# Patient Record
Sex: Female | Born: 1955 | Race: White | Hispanic: No | Marital: Single | Smoking: Former smoker
Health system: Southern US, Community
[De-identification: ages and names within clinical notes are randomized; demographics above are authoritative.]

## PROBLEM LIST (undated history)

## (undated) DIAGNOSIS — I10 Essential (primary) hypertension: Secondary | ICD-10-CM

## (undated) DIAGNOSIS — Z87442 Personal history of urinary calculi: Secondary | ICD-10-CM

## (undated) DIAGNOSIS — N200 Calculus of kidney: Secondary | ICD-10-CM

## (undated) DIAGNOSIS — K219 Gastro-esophageal reflux disease without esophagitis: Secondary | ICD-10-CM

## (undated) DIAGNOSIS — Z720 Tobacco use: Secondary | ICD-10-CM

## (undated) DIAGNOSIS — I779 Disorder of arteries and arterioles, unspecified: Secondary | ICD-10-CM

## (undated) DIAGNOSIS — E785 Hyperlipidemia, unspecified: Secondary | ICD-10-CM

## (undated) DIAGNOSIS — I739 Peripheral vascular disease, unspecified: Secondary | ICD-10-CM

## (undated) DIAGNOSIS — I771 Stricture of artery: Secondary | ICD-10-CM

## (undated) DIAGNOSIS — I2510 Atherosclerotic heart disease of native coronary artery without angina pectoris: Secondary | ICD-10-CM

## (undated) DIAGNOSIS — Z8249 Family history of ischemic heart disease and other diseases of the circulatory system: Secondary | ICD-10-CM

## (undated) DIAGNOSIS — F32A Depression, unspecified: Secondary | ICD-10-CM

## (undated) DIAGNOSIS — F329 Major depressive disorder, single episode, unspecified: Secondary | ICD-10-CM

## (undated) DIAGNOSIS — E119 Type 2 diabetes mellitus without complications: Secondary | ICD-10-CM

## (undated) DIAGNOSIS — I219 Acute myocardial infarction, unspecified: Secondary | ICD-10-CM

## (undated) DIAGNOSIS — Z972 Presence of dental prosthetic device (complete) (partial): Secondary | ICD-10-CM

## (undated) DIAGNOSIS — K08109 Complete loss of teeth, unspecified cause, unspecified class: Secondary | ICD-10-CM

## (undated) DIAGNOSIS — E039 Hypothyroidism, unspecified: Secondary | ICD-10-CM

## (undated) DIAGNOSIS — C679 Malignant neoplasm of bladder, unspecified: Secondary | ICD-10-CM

## (undated) DIAGNOSIS — G629 Polyneuropathy, unspecified: Secondary | ICD-10-CM

## (undated) DIAGNOSIS — F419 Anxiety disorder, unspecified: Secondary | ICD-10-CM

## (undated) DIAGNOSIS — D649 Anemia, unspecified: Secondary | ICD-10-CM

## (undated) DIAGNOSIS — M797 Fibromyalgia: Secondary | ICD-10-CM

## (undated) HISTORY — PX: CORONARY ANGIOPLASTY WITH STENT PLACEMENT: SHX49

## (undated) HISTORY — DX: Disorder of arteries and arterioles, unspecified: I77.9

## (undated) HISTORY — DX: Family history of ischemic heart disease and other diseases of the circulatory system: Z82.49

## (undated) HISTORY — DX: Stricture of artery: I77.1

## (undated) HISTORY — DX: Peripheral vascular disease, unspecified: I73.9

## (undated) HISTORY — DX: Tobacco use: Z72.0

## (undated) HISTORY — DX: Hyperlipidemia, unspecified: E78.5

## (undated) HISTORY — DX: Essential (primary) hypertension: I10

## (undated) HISTORY — DX: Atherosclerotic heart disease of native coronary artery without angina pectoris: I25.10

---

## 2008-09-27 HISTORY — PX: BACK SURGERY: SHX140

## 2011-12-30 HISTORY — PX: US ECHOCARDIOGRAPHY: HXRAD669

## 2013-05-18 ENCOUNTER — Encounter (HOSPITAL_COMMUNITY): Payer: Self-pay | Admitting: Cardiovascular Disease

## 2013-05-18 ENCOUNTER — Telehealth (HOSPITAL_COMMUNITY): Payer: Self-pay | Admitting: Cardiovascular Disease

## 2013-05-24 ENCOUNTER — Telehealth (HOSPITAL_COMMUNITY): Payer: Self-pay | Admitting: Cardiovascular Disease

## 2013-05-25 ENCOUNTER — Encounter (HOSPITAL_COMMUNITY): Payer: Self-pay | Admitting: Cardiovascular Disease

## 2013-06-01 ENCOUNTER — Telehealth (HOSPITAL_COMMUNITY): Payer: Self-pay | Admitting: *Deleted

## 2013-06-04 ENCOUNTER — Other Ambulatory Visit (HOSPITAL_COMMUNITY): Payer: Self-pay | Admitting: Cardiovascular Disease

## 2013-06-04 DIAGNOSIS — I739 Peripheral vascular disease, unspecified: Secondary | ICD-10-CM

## 2013-06-15 ENCOUNTER — Ambulatory Visit (HOSPITAL_COMMUNITY)
Admission: RE | Admit: 2013-06-15 | Discharge: 2013-06-15 | Disposition: A | Discharge status: Home / Self Care | Payer: Medicare Other | Source: Ambulatory Visit | Attending: Cardiovascular Disease | Admitting: Cardiovascular Disease

## 2013-06-15 DIAGNOSIS — I739 Peripheral vascular disease, unspecified: Secondary | ICD-10-CM | POA: Insufficient documentation

## 2013-06-15 DIAGNOSIS — I6529 Occlusion and stenosis of unspecified carotid artery: Secondary | ICD-10-CM

## 2013-06-15 NOTE — Progress Notes (Signed)
Carotid Duplex Completed. °Brianna L Mazza,RVT °

## 2013-06-22 ENCOUNTER — Telehealth: Payer: Self-pay | Admitting: Cardiovascular Disease

## 2013-06-22 NOTE — Telephone Encounter (Signed)
Returned call and spoke w/ pt.  Informed results not available to review and nurse will call or mail letter after they are reviewed by MD.  Pt verbalized understanding and agreed w/ plan.

## 2013-06-22 NOTE — Telephone Encounter (Signed)
Patient would like her doppler results

## 2013-06-28 ENCOUNTER — Telehealth: Payer: Self-pay | Admitting: Cardiovascular Disease

## 2013-06-28 DIAGNOSIS — I6529 Occlusion and stenosis of unspecified carotid artery: Secondary | ICD-10-CM

## 2013-06-28 NOTE — Telephone Encounter (Signed)
Patient notified of results of carotid dopplers

## 2013-06-28 NOTE — Telephone Encounter (Signed)
Patient notified of results of carotid dopplers 

## 2013-06-28 NOTE — Telephone Encounter (Signed)
lmom 

## 2013-06-28 NOTE — Telephone Encounter (Signed)
Order placed for repeat carotid doppler in 6 months 

## 2013-06-28 NOTE — Telephone Encounter (Signed)
Forwarded to Abbe Amsterdam, RN

## 2013-06-28 NOTE — Telephone Encounter (Signed)
Would like doppler results from about 2 weeks ago please.

## 2013-06-28 NOTE — Telephone Encounter (Signed)
Returning your call. °

## 2013-06-28 NOTE — Telephone Encounter (Signed)
Forwarded to Kathryn

## 2013-06-28 NOTE — Telephone Encounter (Signed)
Message copied by Marella Bile on Thu Jun 28, 2013  5:16 PM ------      Message from: [name]      Created: Wed Jun 27, 2013  8:54 PM       Mild to mod abn. Repeat 6 mo ------

## 2013-06-28 NOTE — Telephone Encounter (Signed)
Returning kathryns call °

## 2013-12-18 ENCOUNTER — Ambulatory Visit (HOSPITAL_COMMUNITY)
Admission: RE | Admit: 2013-12-18 | Discharge: 2013-12-18 | Disposition: A | Discharge status: Home / Self Care | Payer: Medicare Other | Source: Ambulatory Visit | Attending: Internal Medicine | Admitting: Internal Medicine

## 2013-12-18 DIAGNOSIS — I6529 Occlusion and stenosis of unspecified carotid artery: Secondary | ICD-10-CM | POA: Insufficient documentation

## 2013-12-18 NOTE — Progress Notes (Signed)
Carotid Duplex Completed. °Brianna L Mazza,RVT °

## 2014-01-01 ENCOUNTER — Other Ambulatory Visit: Payer: Self-pay | Admitting: *Deleted

## 2014-01-01 DIAGNOSIS — I6529 Occlusion and stenosis of unspecified carotid artery: Secondary | ICD-10-CM

## 2014-01-08 ENCOUNTER — Encounter: Payer: Self-pay | Admitting: Cardiovascular Disease

## 2014-01-08 ENCOUNTER — Ambulatory Visit (INDEPENDENT_AMBULATORY_CARE_PROVIDER_SITE_OTHER): Payer: Medicare Other | Admitting: Cardiovascular Disease

## 2014-01-08 VITALS — BP 110/62 | HR 91 | Ht 67.0 in | Wt 169.0 lb

## 2014-01-08 DIAGNOSIS — Z79899 Other long term (current) drug therapy: Secondary | ICD-10-CM

## 2014-01-08 DIAGNOSIS — E785 Hyperlipidemia, unspecified: Secondary | ICD-10-CM | POA: Insufficient documentation

## 2014-01-08 DIAGNOSIS — F17200 Nicotine dependence, unspecified, uncomplicated: Secondary | ICD-10-CM

## 2014-01-08 DIAGNOSIS — I2510 Atherosclerotic heart disease of native coronary artery without angina pectoris: Secondary | ICD-10-CM

## 2014-01-08 DIAGNOSIS — R5381 Other malaise: Secondary | ICD-10-CM

## 2014-01-08 DIAGNOSIS — I6529 Occlusion and stenosis of unspecified carotid artery: Secondary | ICD-10-CM

## 2014-01-08 DIAGNOSIS — Z720 Tobacco use: Secondary | ICD-10-CM | POA: Insufficient documentation

## 2014-01-08 DIAGNOSIS — I739 Peripheral vascular disease, unspecified: Secondary | ICD-10-CM

## 2014-01-08 DIAGNOSIS — D689 Coagulation defect, unspecified: Secondary | ICD-10-CM

## 2014-01-08 DIAGNOSIS — I779 Disorder of arteries and arterioles, unspecified: Secondary | ICD-10-CM

## 2014-01-08 DIAGNOSIS — I771 Stricture of artery: Secondary | ICD-10-CM

## 2014-01-08 DIAGNOSIS — R5383 Other fatigue: Secondary | ICD-10-CM

## 2014-01-08 NOTE — Patient Instructions (Addendum)
Dr. Allyson SabalBerry has ordered a peripheral angiogram (upper extremity angiogram)  to be done at [facility].  This procedure is going to look at the bloodflow in your lower extremities.  If Dr. Allyson SabalBerry is able to open up the arteries, you will have to spend one night in the hospital.  If he is not able to open the arteries, you will be able to go home that same day.    After the procedure, you will not be allowed to drive for 3 days or push, pull, or lift anything greater than 10 lbs for one week.    You will be required to have the following tests prior to the procedure:  1. Blood work-the blood work can be done no more than 7 days prior to the procedure.  It can be done at any [facility] lab.  There is one downstairs on the first floor of this building and one in the [facility] ([address])       *REPS none

## 2014-01-08 NOTE — Progress Notes (Signed)
01/08/2014 Diana Kennedy   1956/06/03  [id number]  Primary Physician Diana Kennedy,Diana R, PA-C Primary Cardiologist: Runell GessJonathan J. Kennedy Kinlaw MD Diana Kennedy,FACC,FAHA, FSCAI   HPI:  The patient is a 58 year old mildly-overweight single Caucasian female, mother of 3 and grandmother to 5 grandchildren, who is currently unemployed. She used to [place] at a [employer]. She is referred by Diana FeltsMike Duran, PA-C, for evaluation of carotid disease.   Her cardiac risk-factor profile is positive for ongoing tobacco abuse of 1/2 pack to 1 pack per day, having smoked for 30 years. I did counsel her for greater than 3 minutes regarding smoking cessation. She has treated hypertension as well as a family history of heart disease with a father who had an MI at age 58. 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 Her last catheterization was in 2009. She apparently has had 5 stents performed [facility]. Dot Kennedy [facility]. She continues to complain of exertional chest pain. She has never had a stroke. Diana Kennedy follows her lipid profile.   Since I saw her a year ago she's been clinically stable. Present carotid Dopplers revealed stable disease with mild right and moderate left ICA stenosis patient remains neurologically asymptomatic. He did also suggest significant right subclavian artery stenosis with to and fro left vertebral filling. She does complain of left upper extremity claudication.    Current Outpatient Prescriptions  Medication Sig Dispense Refill  . ALPRAZolam (XANAX) 1 MG tablet Take 1 tablet by mouth every 6 (six) hours as needed.      [name]. FLUoxetine (PROZAC) 40 MG capsule Take 1 capsule by mouth daily.      [name]. HYDROmorphone HCl 32 MG T24A Take 1 tablet by mouth daily.      [name]. levothyroxine (SYNTHROID, LEVOTHROID) 100 MCG tablet Take 1 tablet by mouth every morning.      [name]. LINZESS 290 MCG CAPS capsule Take 1 capsule by mouth daily.      [name]. LYRICA 200 MG capsule Take 1 capsule by  mouth daily.      . methocarbamol (ROBAXIN) 500 MG tablet Take 1 tablet by mouth 3 (three) times daily.      . Oxycodone HCl 20 MG TABS Take 1 tablet by mouth 5 (five) times daily.      . pravastatin (PRAVACHOL) 20 MG tablet Take 1 tablet by mouth daily.      [name]. PROAIR HFA 108 (90 BASE) MCG/ACT inhaler as needed.      . promethazine (PHENERGAN) 25 MG tablet Take 1 tablet by mouth every 6 (six) hours as needed.       No current facility-administered medications for this visit.    Allergies  Allergen Reactions  . Morphine And Related Itching    History   Social History  . Marital Status: Married    Spouse Name: N/A    Number of Children: N/A  . Years of Education: N/A   Occupational History  . Not on file.   Social History Main Topics  . Smoking status: Current Every Day Smoker -- 0.10 packs/day for 20 years    Types: Cigarettes  . Smokeless tobacco: Never Used  . Alcohol Use: No  . Drug Use: No  . Sexual Activity: Not on file   Other Topics Concern  . Not on file   Social History Narrative  . No narrative on file     Review of Systems: General: negative for chills, fever, night sweats  or weight changes.  Cardiovascular: negative for chest pain, dyspnea on exertion, edema, orthopnea, palpitations, paroxysmal nocturnal dyspnea or shortness of breath Dermatological: negative for rash Respiratory: negative for cough or wheezing Urologic: negative for hematuria Abdominal: negative for nausea, vomiting, diarrhea, bright red blood per rectum, melena, or hematemesis Neurologic: negative for visual changes, syncope, or dizziness All other systems reviewed and are otherwise negative except as noted above.    Blood pressure 110/62, pulse 91, height 5\' 7"  (1.702 m), weight 169 lb (76.658 kg).  General appearance: alert and no distress Neck: no adenopathy, no carotid bruit, no JVD, supple, symmetrical, trachea midline, thyroid not enlarged, symmetric, no  tenderness/mass/nodules and right subclavian artery bruit Lungs: clear to auscultation bilaterally Heart: regular rate and rhythm, S1, S2 normal, no murmur, click, rub or gallop Extremities: extremities normal, atraumatic, no cyanosis or edema  EKG normal sinus rhythm 91 without ST or T wave changes  ASSESSMENT AND PLAN:   Carotid artery disease The patient was referred to me by Diana FeltsMike Duran PA-C for evaluation of carotid artery disease. Her recent Dopplers performed 12/18/13 revealed mild right internal carotid artery stenosis A. Mild to moderate left. She did have high-grade disease in her right subclavian artery with and throat vertebral flow. She complains of right upper extremity claudication on occasion. Based on this I will arrange for her to undergo angiography and potential intervention.      Runell GessJonathan J. Belen Zwahlen MD FACP,FACC,FAHA, [facility] 01/08/2014 11:45 AM

## 2014-01-08 NOTE — Assessment & Plan Note (Signed)
The patient was referred to me by [name] PA-C for evaluation of carotid artery disease. Her recent Dopplers performed 12/18/13 revealed mild right internal carotid artery stenosis A. Mild to moderate left. She did have high-grade disease in her right subclavian artery with and throat vertebral flow. She complains of right upper extremity claudication on occasion. Based on this I will arrange for her to undergo angiography and potential intervention.

## 2014-01-14 ENCOUNTER — Telehealth: Payer: Self-pay | Admitting: Cardiovascular Disease

## 2014-01-14 NOTE — Telephone Encounter (Signed)
lmsg for patient to call and schedule PV angiogram.

## 2014-01-15 ENCOUNTER — Encounter: Payer: Self-pay | Admitting: Cardiovascular Disease

## 2014-02-01 LAB — CBC
HEMATOCRIT: 43.7 % (ref 36.0–46.0)
Hemoglobin: 14.6 g/dL (ref 12.0–15.0)
MCH: 28.4 pg (ref 26.0–34.0)
MCHC: 33.4 g/dL (ref 30.0–36.0)
MCV: 85 fL (ref 78.0–100.0)
PLATELETS: 280 10*3/uL (ref 150–400)
RBC: 5.14 MIL/uL — ABNORMAL HIGH (ref 3.87–5.11)
RDW: 14.2 % (ref 11.5–15.5)
WBC: 6.7 10*3/uL (ref 4.0–10.5)

## 2014-02-01 LAB — BASIC METABOLIC PANEL
BUN: 17 mg/dL (ref 6–23)
CO2: 29 meq/L (ref 19–32)
Calcium: 10 mg/dL (ref 8.4–10.5)
Chloride: 100 mEq/L (ref 96–112)
Creat: 0.71 mg/dL (ref 0.50–1.10)
GLUCOSE: 84 mg/dL (ref 70–99)
Potassium: 4.7 mEq/L (ref 3.5–5.3)
SODIUM: 140 meq/L (ref 135–145)

## 2014-02-01 LAB — TSH: TSH: 0.347 u[IU]/mL — AB (ref 0.350–4.500)

## 2014-02-01 LAB — APTT: aPTT: 34 seconds (ref 24–37)

## 2014-02-01 LAB — PROTIME-INR
INR: 0.89 (ref <1.50)
Prothrombin Time: 12 seconds (ref 11.6–15.2)

## 2014-02-04 ENCOUNTER — Encounter: Payer: Self-pay | Admitting: *Deleted

## 2014-02-07 ENCOUNTER — Ambulatory Visit (HOSPITAL_COMMUNITY)
Admission: RE | Admit: 2014-02-07 | Discharge: 2014-02-07 | Disposition: A | Discharge status: Home / Self Care | Payer: Medicare Other | Source: Ambulatory Visit | Attending: Cardiovascular Disease | Admitting: Cardiovascular Disease

## 2014-02-07 ENCOUNTER — Encounter (HOSPITAL_COMMUNITY)
Admission: RE | Disposition: A | Discharge status: Home / Self Care | Payer: Self-pay | Source: Ambulatory Visit | Attending: Cardiovascular Disease

## 2014-02-07 DIAGNOSIS — Z9861 Coronary angioplasty status: Secondary | ICD-10-CM | POA: Insufficient documentation

## 2014-02-07 DIAGNOSIS — I10 Essential (primary) hypertension: Secondary | ICD-10-CM | POA: Insufficient documentation

## 2014-02-07 DIAGNOSIS — I252 Old myocardial infarction: Secondary | ICD-10-CM | POA: Insufficient documentation

## 2014-02-07 DIAGNOSIS — F17200 Nicotine dependence, unspecified, uncomplicated: Secondary | ICD-10-CM | POA: Insufficient documentation

## 2014-02-07 DIAGNOSIS — I6529 Occlusion and stenosis of unspecified carotid artery: Secondary | ICD-10-CM | POA: Insufficient documentation

## 2014-02-07 DIAGNOSIS — I771 Stricture of artery: Secondary | ICD-10-CM | POA: Insufficient documentation

## 2014-02-07 DIAGNOSIS — I708 Atherosclerosis of other arteries: Secondary | ICD-10-CM

## 2014-02-07 SURGERY — BILATERAL UPPER EXTREMITY ANGIOGRAM

## 2014-02-07 MED ORDER — HEPARIN (PORCINE) IN NACL 2-0.9 UNIT/ML-% IJ SOLN
INTRAMUSCULAR | Status: AC
  Filled 2014-02-07: qty 1000

## 2014-02-07 MED ORDER — LIDOCAINE HCL (PF) 1 % IJ SOLN
INTRAMUSCULAR | Status: AC
  Filled 2014-02-07: qty 30

## 2014-02-07 MED ORDER — ASPIRIN 81 MG PO CHEW
81.0000 mg | CHEWABLE_TABLET | ORAL | Status: AC
  Administered 2014-02-07: 81 mg via ORAL
  Filled 2014-02-07: qty 1

## 2014-02-07 MED ORDER — FENTANYL CITRATE 0.05 MG/ML IJ SOLN
INTRAMUSCULAR | Status: AC
  Filled 2014-02-07: qty 2

## 2014-02-07 MED ORDER — SODIUM CHLORIDE 0.9 % IV SOLN: INTRAVENOUS | Status: AC

## 2014-02-07 MED ORDER — ASPIRIN EC 325 MG PO TBEC
325.0000 mg | DELAYED_RELEASE_TABLET | Freq: Every day | ORAL | Status: DC
Start: 2014-02-07 — End: 2014-02-07

## 2014-02-07 MED ORDER — MIDAZOLAM HCL 2 MG/2ML IJ SOLN
INTRAMUSCULAR | Status: AC
  Filled 2014-02-07: qty 2

## 2014-02-07 MED ORDER — SODIUM CHLORIDE 0.9 % IJ SOLN: 3.0000 mL | INTRAMUSCULAR | Status: DC | PRN

## 2014-02-07 MED ORDER — HYDRALAZINE HCL 20 MG/ML IJ SOLN: 10.0000 mg | INTRAMUSCULAR | Status: DC

## 2014-02-07 MED ORDER — OXYCODONE-ACETAMINOPHEN 5-325 MG PO TABS
2.0000 | ORAL_TABLET | Freq: Once | ORAL | Status: AC
  Administered 2014-02-07: 2 via ORAL

## 2014-02-07 MED ORDER — DIAZEPAM 5 MG PO TABS
5.0000 mg | ORAL_TABLET | ORAL | Status: AC
  Administered 2014-02-07: 5 mg via ORAL
  Filled 2014-02-07: qty 1

## 2014-02-07 MED ORDER — OXYCODONE-ACETAMINOPHEN 5-325 MG PO TABS
ORAL_TABLET | ORAL | Status: AC
  Filled 2014-02-07: qty 2

## 2014-02-07 MED ORDER — SODIUM CHLORIDE 0.9 % IV SOLN
INTRAVENOUS | Status: DC
  Administered 2014-02-07: 09:00:00 via INTRAVENOUS

## 2014-02-07 NOTE — Progress Notes (Signed)
Ambulated without complaint. Groin site level 0.

## 2014-02-07 NOTE — CV Procedure (Signed)
Diana Kennedy is a 58 y.o. female    [id number] LOCATION:  FACILITY: [facility]  PHYSICIAN: Nanetta BattyJonathan Anastaisa Wooding, M.D. Jan 01, 1956   DATE OF PROCEDURE:  02/07/2014  DATE OF DISCHARGE:     PV Angiogram/Intervention    History obtained from chart review.The patient is a 58 year old mildly-overweight single Caucasian female, mother of 3 and grandmother to 5 grandchildren, who is currently unemployed. She used to work at a [employer]. She is referred by [name], PA-C, for evaluation of carotid disease.   Her cardiac risk-factor profile is positive for ongoing tobacco abuse of 1/2 pack to 1 pack per day, having smoked for 30 years. I did counsel her for greater than 3 minutes regarding smoking cessation. She has treated hypertension as well as a family history of heart disease with a father who had an MI at age 869. 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 Her last catheterization was in 2009. She apparently has had 5 stents performed by [facility]. She continues to complain of exertional chest pain. She has never had a stroke. [name] follows her lipid profile.  Since I saw her a year ago she's been clinically stable. Present carotid Dopplers revealed stable disease with mild right and moderate left ICA stenosis patient remains neurologically asymptomatic. He did also suggest significant right subclavian artery stenosis with to and fro left vertebral filling. She does complain of left upper extremity claudication.    PROCEDURE DESCRIPTION:   The patient was brought to the second floor [facility] lab in the postabsorptive state. She was premedicated with Valium 5 mg by mouth. Her right groinwas prepped and shaved in usual sterile fashion. Xylocaine 1% was used  for local anesthesia. A 5 French sheath was inserted into the right common femoral artery using standard Seldinger technique. A 5 French pigtail catheter was used for  aortic arch angiography and a 5 JamaicaFrench JB 1 catheter was used for selective right and left subclavian artery angiography.Visipaque diluted for the entirety of the case (64 cc administered the patient). Retrograde aortic pressure was monitored during the case.   HEMODYNAMICS:    AO SYSTOLIC/AO DIASTOLIC: 161/85   Angiographic Data:   1: Aortic arch arteriogram-type I arch was patent arch vessels  2: Innominate angiogram-80-90% eccentric proximal/ostial right subclavian artery stenosis just beyond the takeoff of the right common carotid artery with poststenotic dilatation a small right vertebral artery.  3: Left subclavian artery-the left subclavian artery was widely patent. The left vertebral was dominant. There is no retrograde filling of the right vertebral with delayed imaging.  IMPRESSION:high-grade ostial right subclavian artery stenosis with poststenotic dilatation and a diminutive right vertebral artery. I believe this would be high risk for percutaneous intervention given its proximity to the common carotid artery, the poststenotic dilatation in the vertebral artery proximally as well. I'm not convinced that this is causing her symptoms since she has bilateral symptoms but only unilateral disease. The sheath was removed and pressure was held on the groin to achieve hemostasis. The patient left the lab in stable condition. She'll be gently hydrated, discharged home after remaining recumbent or 4 hours. I will see her back in the office in several weeks for followup and to discuss her therapeutic options. Might Armando Ganguran PA-C, for referring provider, was notified of these results.    Runell GessJonathan J Mahagony Grieb. MD, [facility] 02/07/2014 10:37 AM

## 2014-02-07 NOTE — Interval H&P Note (Signed)
History and Physical Interval Note:  02/07/2014 10:06 AM  Diana Kennedy  has presented today for surgery, with the diagnosis of pad  The various methods of treatment have been discussed with the patient and family. After consideration of risks, benefits and other options for treatment, the patient has consented to  Procedure(s): LOWER EXTREMITY ANGIOGRAM (N/A) as a surgical intervention .  The patient's history has been reviewed, patient examined, no change in status, stable for surgery.  I have reviewed the patient's chart and labs.  Questions were answered to the patient's satisfaction.     Runell GessJonathan J Berry

## 2014-02-07 NOTE — Discharge Instructions (Signed)
Arteriogram °Care After °These instructions give you information on caring for yourself after your procedure. Your doctor may also give you more specific instructions. Call your doctor if you have any problems or questions after your procedure. °HOME CARE °· Stay in bed the rest of the day. °· Keep your leg straight for at least 6 hours. °· Do not lift anything heavier than 10 pounds (about a gallon of milk) for 2 days. °· Do not walk a lot, run, or drive for 2 days. °· Return to normal activities in 2 days or as told by your doctor. °Finding out the results of your test °Ask when your test results will be ready. Make sure you get your test results. °GET HELP RIGHT AWAY IF:  °· You have fever of 102° F (38.9° C) or higher. °· You have more pain in your leg. °· The leg that was cut is: °· Bleeding. °· Puffy (swollen) or red. °· Cold. °· Pale or changes color. °· Weak. °· Tingly or numb. °If you go to the Emergency Room, tell your nurse that you have had an arteriogram. Take this paper with you to show the nurse. °MAKE SURE YOU: °· Understand these instructions. °· Will watch your condition. °· Will get help right away if you are not doing well or get worse. °Document Released: 12/10/2008 Document Revised: 12/06/2011 Document Reviewed: 12/10/2008 °ExitCare® Patient Information ©2014 [employer]. ° °

## 2014-02-07 NOTE — H&P (View-Only) (Signed)
01/08/2014 Diana Kennedy   1956/06/03  [id number]  Primary Physician [name],MICHAEL R, PA-C Primary Cardiologist: Runell GessJonathan J. Wilkes Potvin MD Roseanne RenoFACP,FACC,FAHA, FSCAI   HPI:  The patient is a 58 year old mildly-overweight single Caucasian female, mother of 3 and grandmother to 5 grandchildren, who is currently unemployed. She used to [place] a [employer]. She is referred by [name], PA-C, for evaluation of carotid disease.   Her cardiac risk-factor profile is positive for ongoing tobacco abuse of 1/2 pack to 1 pack per day, having smoked for 30 years. I did counsel her for greater than 3 minutes regarding smoking cessation. She has treated hypertension as well as a family history of heart disease with a father who had an MI at age 58. 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 Her last catheterization was in 2009. She apparently has had 5 stents performed by Dr. Dot Beenohrbeck in [facility]. She continues to complain of exertional chest pain. She has never had a stroke. [name] follows her lipid profile.   Since I saw her a year ago she's been clinically stable. Present carotid Dopplers revealed stable disease with mild right and moderate left ICA stenosis patient remains neurologically asymptomatic. He did also suggest significant right subclavian artery stenosis with to and fro left vertebral filling. She does complain of left upper extremity claudication.    Current Outpatient Prescriptions  Medication Sig Dispense Refill  . ALPRAZolam (XANAX) 1 MG tablet Take 1 tablet by mouth every 6 (six) hours as needed.      [name]. FLUoxetine (PROZAC) 40 MG capsule Take 1 capsule by mouth daily.      [name]. HYDROmorphone HCl 32 MG T24A Take 1 tablet by mouth daily.      [name]. levothyroxine (SYNTHROID, LEVOTHROID) 100 MCG tablet Take 1 tablet by mouth every morning.      [name]. LINZESS 290 MCG CAPS capsule Take 1 capsule by mouth daily.      [name]. LYRICA 200 MG capsule Take 1 capsule by  mouth daily.      . methocarbamol (ROBAXIN) 500 MG tablet Take 1 tablet by mouth 3 (three) times daily.      . Oxycodone HCl 20 MG TABS Take 1 tablet by mouth 5 (five) times daily.      . pravastatin (PRAVACHOL) 20 MG tablet Take 1 tablet by mouth daily.      [name]. PROAIR HFA 108 (90 BASE) MCG/ACT inhaler as needed.      . promethazine (PHENERGAN) 25 MG tablet Take 1 tablet by mouth every 6 (six) hours as needed.       No current facility-administered medications for this visit.    Allergies  Allergen Reactions  . Morphine And Related Itching    History   Social History  . Marital Status: Married    Spouse Name: N/A    Number of Children: N/A  . Years of Education: N/A   Occupational History  . Not on file.   Social History Main Topics  . Smoking status: Current Every Day Smoker -- 0.10 packs/day for 20 years    Types: Cigarettes  . Smokeless tobacco: Never Used  . Alcohol Use: No  . Drug Use: No  . Sexual Activity: Not on file   Other Topics Concern  . Not on file   Social History Narrative  . No narrative on file     Review of Systems: General: negative for chills, fever, night sweats  or weight changes.  Cardiovascular: negative for chest pain, dyspnea on exertion, edema, orthopnea, palpitations, paroxysmal nocturnal dyspnea or shortness of breath Dermatological: negative for rash Respiratory: negative for cough or wheezing Urologic: negative for hematuria Abdominal: negative for nausea, vomiting, diarrhea, bright red blood per rectum, melena, or hematemesis Neurologic: negative for visual changes, syncope, or dizziness All other systems reviewed and are otherwise negative except as noted above.    Blood pressure 110/62, pulse 91, height 5\' 7"  (1.702 m), weight 169 lb (76.658 kg).  General appearance: alert and no distress Neck: no adenopathy, no carotid bruit, no JVD, supple, symmetrical, trachea midline, thyroid not enlarged, symmetric, no  tenderness/mass/nodules and right subclavian artery bruit Lungs: clear to auscultation bilaterally Heart: regular rate and rhythm, S1, S2 normal, no murmur, click, rub or gallop Extremities: extremities normal, atraumatic, no cyanosis or edema  EKG normal sinus rhythm 91 without ST or T wave changes  ASSESSMENT AND PLAN:   Carotid artery disease The patient was referred to me by [name] PA-C for evaluation of carotid artery disease. Her recent Dopplers performed 12/18/13 revealed mild right internal carotid artery stenosis A. Mild to moderate left. She did have high-grade disease in her right subclavian artery with and throat vertebral flow. She complains of right upper extremity claudication on occasion. Based on this I will arrange for her to undergo angiography and potential intervention.      Runell GessJonathan J. Navneet Schmuck MD FACP,FACC,FAHA, [facility] 01/08/2014 11:45 AM

## 2014-03-06 ENCOUNTER — Encounter: Payer: Self-pay | Admitting: *Deleted

## 2014-03-08 ENCOUNTER — Encounter: Payer: Self-pay | Admitting: Cardiovascular Disease

## 2014-03-11 ENCOUNTER — Encounter: Payer: Self-pay | Admitting: Cardiology

## 2014-03-11 ENCOUNTER — Ambulatory Visit (INDEPENDENT_AMBULATORY_CARE_PROVIDER_SITE_OTHER): Payer: Medicare Other | Admitting: Cardiology

## 2014-03-11 VITALS — BP 100/70 | HR 62 | Ht 67.0 in | Wt 172.0 lb

## 2014-03-11 DIAGNOSIS — I6529 Occlusion and stenosis of unspecified carotid artery: Secondary | ICD-10-CM

## 2014-03-11 DIAGNOSIS — F17200 Nicotine dependence, unspecified, uncomplicated: Secondary | ICD-10-CM

## 2014-03-11 DIAGNOSIS — Z720 Tobacco use: Secondary | ICD-10-CM

## 2014-03-11 DIAGNOSIS — I209 Angina pectoris, unspecified: Secondary | ICD-10-CM

## 2014-03-11 DIAGNOSIS — I771 Stricture of artery: Secondary | ICD-10-CM

## 2014-03-11 DIAGNOSIS — I739 Peripheral vascular disease, unspecified: Secondary | ICD-10-CM

## 2014-03-11 DIAGNOSIS — I2510 Atherosclerotic heart disease of native coronary artery without angina pectoris: Secondary | ICD-10-CM

## 2014-03-11 DIAGNOSIS — I708 Atherosclerosis of other arteries: Secondary | ICD-10-CM

## 2014-03-11 DIAGNOSIS — I2089 Other forms of angina pectoris: Secondary | ICD-10-CM

## 2014-03-11 DIAGNOSIS — E785 Hyperlipidemia, unspecified: Secondary | ICD-10-CM

## 2014-03-11 DIAGNOSIS — I208 Other forms of angina pectoris: Secondary | ICD-10-CM

## 2014-03-11 NOTE — Assessment & Plan Note (Addendum)
Pt stated she must stop after climbing stairs due to shortness of breath and chest pain, resolves with rest.  She does not use NTG due to headaches.  Last nuc was 2013.  Last cath 2009 we will plan lexiscan myoview.  Discomfort in both arms could be secondary to cardiac angina.  She has a  history of 5 stents.

## 2014-03-11 NOTE — Assessment & Plan Note (Addendum)
Continued pain with Rt. Arm with any activity some in Lt arm as well.  She would like to see Dr. Myra GianottiBrabham for possible surgery.  Discussed with Dr. Allyson SabalBerry will refer.

## 2014-03-11 NOTE — Progress Notes (Signed)
03/14/2014   PCP: [name],MICHAEL R, PA-C   Chief Complaint  Patient presents with  . Follow-up    pt. did not have PV angio    Primary Cardiologist:Dr. Erlene QuanJ. Berry   HPI:  58 year old mildly-overweight single Caucasian female, mother of 3 and grandmother to 5 grandchildren, who is currently unemployed. She used to work at a [employer]. She was referred by [name], PA-C, to Dr. Allyson SabalBerry for evaluation of carotid disease.   Her cardiac risk-factor profile is positive for ongoing tobacco abuse of 1/2 pack to 1 pack per day, having smoked for 30 years. She is currently using smokeless device implants to decrease nicotine in the device in the upcoming weeks.   She has treated hypertension as well as a family history of heart disease with a father who had an MI at age 58. 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 Her last catheterization was in 2009. She apparently has had 5 stents performed [facility]. She continues to complain of exertional chest pain. She has never had a stroke. [name] follows her lipid profile.   Present carotid Dopplers revealed stable disease with mild right and moderate left ICA stenosis patient remains neurologically asymptomatic. They did also suggest significant right subclavian artery stenosis with to and fro left vertebral filling. She does complain of left upper extremity claudication.  She underwent PV angiogram with Dr. Allyson SabalBerry 14 2015.  She is back today to review results and to make further plans for her treatment .  She has high-grade ostial right subclavian artery stenosis with poststenotic dilatation and a diminutive right vertebral artery. I believe this would be high risk for percutaneous intervention given its proximity to the common carotid artery, the poststenotic dilatation in the vertebral artery proximally as well. Dr. Erlene QuanJ. Berry was not convinced that this is causing her  symptoms since she has bilateral symptoms but only unilateral disease.  Today she continues to complain of arm pain and fatigue of that arm.  She also complains of chest pain that is with activity. We'll plan to make her  An appointment with Dr. Myra GianottiBrabham per Dr. Hazle CocaBerry's suggestion- for another opinion and possible surgery.  She does need stress test if she has not had one recently. Since her symptoms can be in both arms it may be an anginal equivalent or cardiac pain.    Allergies  Allergen Reactions  . Morphine And Related Itching    Current Outpatient Prescriptions  Medication Sig Dispense Refill  . ALPRAZolam (XANAX) 1 MG tablet Take 1 tablet by mouth every 6 (six) hours as needed for anxiety.       [name]. FLUoxetine (PROZAC) 40 MG capsule Take 1 capsule by mouth daily.      [name]. HYDROmorphone HCl 32 MG T24A Take 1 tablet by mouth daily.      [name]. levothyroxine (SYNTHROID, LEVOTHROID) 100 MCG tablet Take 1 tablet by mouth every morning.      [name]. LINZESS 290 MCG CAPS capsule Take 1 capsule by mouth daily.      [name]. LYRICA 200 MG capsule Take 1 capsule by mouth daily.      . methocarbamol (ROBAXIN) 500 MG tablet Take 1 tablet by mouth 3 (three) times daily.      . Oxycodone HCl 20 MG TABS Take 1 tablet by mouth 5 (five) times daily.      .[name]  pravastatin (PRAVACHOL) 20 MG tablet Take 1 tablet by mouth daily.      [name]. PROAIR HFA 108 (90 BASE) MCG/ACT inhaler Inhale 1 puff into the lungs every 4 (four) hours as needed for wheezing.       . promethazine (PHENERGAN) 25 MG tablet Take 1 tablet by mouth every 6 (six) hours as needed for nausea.       [name]. zolpidem (AMBIEN) 10 MG tablet        No current facility-administered medications for this visit.    Past Medical History  Diagnosis Date  . Subclavian artery stenosis, right   . Carotid artery disease   . Tobacco abuse   . Coronary artery disease   . Hyperlipidemia   . Family history of coronary artery disease   . Hypertension     Past Surgical History    Procedure Laterality Date  . Back surgery  2010  . [place] echocardiography  12/30/2011    mild LVH, mild AOV sclerosis,trace MR,TR,trace/mild physiologic PI    ZOX:WRUEAVW:UJROS:General:no colds or fevers, + weight increase Skin:no rashes or ulcers HEENT:no blurred vision, no congestion CV:see HPI PUL:see HPI GI:no diarrhea constipation or melena, no indigestion GU:no hematuria, no dysuria MS:no joint pain, no claudication Neuro:no syncope, no lightheadedness Endo:no diabetes, + thyroid disease-stable  Wt Readings from Last 3 Encounters:  03/11/14 172 lb (78.019 kg)  02/07/14 160 lb (72.576 kg)  02/07/14 160 lb (72.576 kg)    PHYSICAL EXAM BP 100/70  Pulse 62  Ht 5\' 7"  (1.702 m)  Wt 172 lb (78.019 kg)  BMI 26.93 kg/m2 General:Pleasant affect, NAD Skin:Warm and dry, brisk capillary refill HEENT:normocephalic, sclera clear, mucus membranes moist Neck:supple, no JVD, no bruits, no adenopathy  Heart:S1S2 RRR without murmur, gallup, rub or click Lungs:clear without rales, rhonchi, or wheezes WJX:BJYNAbd:soft, non tender, + BS, do not palpate liver spleen or masses Ext:no lower ext edema, 2+ pedal pulses, 2+ radial pulses Neuro:alert and oriented, MAE, follows commands, + facial symmetry  [place] [facility] [place] no recent stress test. [facility] 2014 normal study normal cardiac chamber sizes.  Last nuclear stress test at [facility] stress test there is a medium perfusion abnormality of mild intensity in the basal inferior septal basal inferior and basal inferior lateral myocardial wall. EF was 66%. There was no evidence of ischemia on this test. Will have the patient undergo a stress test here in our office prior to her appointment with Dr. Myra GianottiBrabham  ASSESSMENT AND PLAN Subclavian artery stenosis, right Continued pain with Rt. Arm with any activity some in Lt arm as well.  She would like to see Dr. Myra GianottiBrabham for possible surgery.  Discussed with Dr. Allyson SabalBerry will refer.        Angina effort Pt stated she must stop after climbing stairs due to shortness of breath and chest pain, resolves with rest.  She does not use NTG due to headaches.  Last nuc was 2013.  Last cath 2009 we will plan lexiscan myoview.  Discomfort in both arms could be secondary to cardiac angina.  She has a  history of 5 stents.  Coronary artery disease involving native coronary artery History 5 coronary stents and myocardial infarction.  As stated we'll proceed with stress test.  Hyperlipidemia Managed by PCP  Tobacco abuse She is changed to paper cigarettes electronically cigarettes and hoping to decrease nicotine dosages

## 2014-03-11 NOTE — Patient Instructions (Addendum)
If your stress test from [facility] is not within a reasonable amount of time we will repeat.  We are scheduling an appt. For you with Dr. Myra GianottiBrabham vascular surgeon.    We will call you concerning the stress test once results of your old test reviewed by Dr. Allyson SabalBerry.--we will schedule Lexiscan myoview.

## 2014-03-13 ENCOUNTER — Telehealth (HOSPITAL_COMMUNITY): Payer: Self-pay

## 2014-03-14 ENCOUNTER — Encounter: Payer: Self-pay | Admitting: Cardiology

## 2014-03-14 NOTE — Assessment & Plan Note (Signed)
Managed by PCP

## 2014-03-14 NOTE — Assessment & Plan Note (Signed)
History 5 coronary stents and myocardial infarction.  As stated we'll proceed with stress test.

## 2014-03-14 NOTE — Assessment & Plan Note (Signed)
She is changed to paper cigarettes electronically cigarettes and hoping to decrease nicotine dosages

## 2014-03-15 ENCOUNTER — Encounter (HOSPITAL_COMMUNITY): Payer: Medicare Other

## 2014-03-21 ENCOUNTER — Telehealth (HOSPITAL_COMMUNITY): Payer: Self-pay

## 2014-03-22 ENCOUNTER — Telehealth (HOSPITAL_COMMUNITY): Payer: Self-pay

## 2014-03-26 ENCOUNTER — Encounter (HOSPITAL_COMMUNITY): Payer: Medicare Other

## 2014-03-28 NOTE — Telephone Encounter (Signed)
Encounter complete. 

## 2014-04-02 ENCOUNTER — Ambulatory Visit (HOSPITAL_COMMUNITY)
Admission: RE | Admit: 2014-04-02 | Discharge: 2014-04-02 | Disposition: A | Discharge status: Home / Self Care | Payer: Medicare Other | Source: Ambulatory Visit | Attending: Cardiology | Admitting: Cardiology

## 2014-04-02 DIAGNOSIS — R55 Syncope and collapse: Secondary | ICD-10-CM | POA: Diagnosis not present

## 2014-04-02 DIAGNOSIS — R5383 Other fatigue: Secondary | ICD-10-CM

## 2014-04-02 DIAGNOSIS — R0609 Other forms of dyspnea: Secondary | ICD-10-CM | POA: Diagnosis not present

## 2014-04-02 DIAGNOSIS — I2510 Atherosclerotic heart disease of native coronary artery without angina pectoris: Secondary | ICD-10-CM

## 2014-04-02 DIAGNOSIS — R0989 Other specified symptoms and signs involving the circulatory and respiratory systems: Secondary | ICD-10-CM | POA: Insufficient documentation

## 2014-04-02 DIAGNOSIS — I739 Peripheral vascular disease, unspecified: Secondary | ICD-10-CM

## 2014-04-02 DIAGNOSIS — R079 Chest pain, unspecified: Secondary | ICD-10-CM | POA: Insufficient documentation

## 2014-04-02 DIAGNOSIS — E785 Hyperlipidemia, unspecified: Secondary | ICD-10-CM

## 2014-04-02 DIAGNOSIS — R5381 Other malaise: Secondary | ICD-10-CM | POA: Diagnosis not present

## 2014-04-02 DIAGNOSIS — I208 Other forms of angina pectoris: Secondary | ICD-10-CM

## 2014-04-02 DIAGNOSIS — R002 Palpitations: Secondary | ICD-10-CM | POA: Diagnosis not present

## 2014-04-02 DIAGNOSIS — I209 Angina pectoris, unspecified: Secondary | ICD-10-CM

## 2014-04-02 MED ORDER — AMINOPHYLLINE 25 MG/ML IV SOLN
125.0000 mg | Freq: Once | INTRAVENOUS | Status: AC
  Administered 2014-04-02: 125 mg via INTRAVENOUS

## 2014-04-02 MED ORDER — TECHNETIUM TC 99M SESTAMIBI GENERIC - CARDIOLITE
10.6000 | Freq: Once | INTRAVENOUS | Status: AC | PRN
  Administered 2014-04-02: 11 via INTRAVENOUS

## 2014-04-02 MED ORDER — REGADENOSON 0.4 MG/5ML IV SOLN
0.4000 mg | Freq: Once | INTRAVENOUS | Status: AC
  Administered 2014-04-02: 0.4 mg via INTRAVENOUS

## 2014-04-02 MED ORDER — TECHNETIUM TC 99M SESTAMIBI GENERIC - CARDIOLITE
30.1000 | Freq: Once | INTRAVENOUS | Status: AC | PRN
  Administered 2014-04-02: 30.1 via INTRAVENOUS

## 2014-04-02 NOTE — Procedures (Addendum)
[facility] [address] [place] [id number] [phone]  Cardiology Nuclear Med Study  Diana Kennedy is a 58 y.o. female     MRN : [id number]     DOB: [birth year]  Procedure Date: 04/02/2014  Nuclear Med Background Indication for Stress Test:  Evaluation for Ischemia and Surgical Clearance History:  COPD and CAD;MI-X3;STENT/PTCA;Last NUC MPI in 2013;ECHO-12/30/2011 Cardiac Risk Factors: Carotid Disease, Family History - CAD, Hypertension, Lipids, Overweight, PVD and Smoker  Symptoms:  Chest Pain, DOE, Fatigue, Near Syncope and Palpitations   Nuclear Pre-Procedure Caffeine/Decaff Intake:  1:00am NPO After: 11am   IV Site: R Forearm  IV 0.9% NS with Angio Cath:  22g  Chest Size (in):  n/a  IV Started by: Berdie OgrenAmanda Wease, RN  Height: 5\' 7"  (1.702 m)  Cup Size: Kennedy  BMI:  Body mass index is 26.93 kg/(m^2). Weight:  172 lb (78.019 kg)   Tech Comments:  n/a    Nuclear Med Study 1 or 2 day study: 1 day  Stress Test Type:  Lexiscan  Order Authorizing Provider:  Nanetta BattyJonathan Berry, Diana Kennedy   Resting Radionuclide: Technetium 2655m Sestamibi  Resting Radionuclide Dose: 10.6 mCi   Stress Radionuclide:  Technetium 3555m Sestamibi  Stress Radionuclide Dose: 30.1 mCi           Stress Protocol Rest HR:78 Stress HR: 104  Rest BP: 127/85 Stress BP: 154/79  Exercise Time (min): n/a METS: n/a   Predicted Max HR: 163 bpm % Max HR: 63.8 bpm Rate Pressure Product: [id number]  Dose of Adenosine (mg):  n/a Dose of Lexiscan: 0.4 mg  Dose of Atropine (mg): n/a Dose of Dobutamine: n/a mcg/kg/min (at max HR)  Stress Test Technologist: Esperanza Sheetserry-Marie Martin, CCT Nuclear Technologist: Gonzella LexPam Phillips, CNMT   Rest Procedure:  Myocardial perfusion imaging was performed at rest 45 minutes following the intravenous administration of Technetium 3255m Sestamibi. Stress Procedure:  The patient received IV Lexiscan 0.4 mg over 15-seconds.  Technetium 5755m Sestamibi injected IV at  30-seconds.  Patient experienced SOB and headache and 125 mg of Aminophylline IV was administered.  There were no significant changes with Lexiscan.  Quantitative spect images were obtained after a 45 minute delay.  Transient Ischemic Dilatation (Normal <1.22):  1.06  QGS EDV:  50 ml QGS ESV:  13 ml LV Ejection Fraction: 75%  Rest ECG: NSR - Normal EKG  Stress ECG: No significant change from baseline ECG  QPS Raw Data Images:  Normal; no motion artifact; normal heart/lung ratio. Stress Images:  There is decreased uptake in the anterior wall. Rest Images:  Normal homogeneous uptake in all areas of the myocardium. Subtraction (SDS):  These findings are consistent with ischemia.  Impression Exercise Capacity:  Lexiscan with no exercise. BP Response:  Normal blood pressure response. Clinical Symptoms:  There is dyspnea. ECG Impression:  No significant ECG changes with Lexiscan. Comparison with Prior Nuclear Study: No images to compare  Overall Impression:  High risk stress nuclear study with mostly reversible mid to distal anterior defect. Suggestive of ischemia, however, shifting breast artifact cannot be excluded. Clinical correlation is recommended.  LV Wall Motion:  NL LV Function; NL Wall Motion; EF 75%  Diana Kennedy. Diana Kennedy, Diana Kennedy, [name] Board Certified in Nuclear Cardiology Attending Cardiologist [facility]  Diana Kennedy,Diana Birchard Kennedy, Diana Kennedy  04/02/2014 5:24 PM

## 2014-04-03 ENCOUNTER — Telehealth: Payer: Self-pay | Admitting: Cardiology

## 2014-04-03 NOTE — Telephone Encounter (Signed)
I asked pt to call ( I left a message) concerning stress test and need for cardiac cath.

## 2014-04-04 NOTE — Telephone Encounter (Signed)
Encounter compete. 

## 2014-04-05 ENCOUNTER — Ambulatory Visit (INDEPENDENT_AMBULATORY_CARE_PROVIDER_SITE_OTHER): Payer: Medicare Other | Admitting: Cardiovascular Disease

## 2014-04-05 ENCOUNTER — Encounter: Payer: Self-pay | Admitting: Cardiovascular Disease

## 2014-04-05 VITALS — BP 123/80 | HR 89 | Ht 67.0 in | Wt 170.0 lb

## 2014-04-05 DIAGNOSIS — Z79899 Other long term (current) drug therapy: Secondary | ICD-10-CM

## 2014-04-05 DIAGNOSIS — D689 Coagulation defect, unspecified: Secondary | ICD-10-CM

## 2014-04-05 DIAGNOSIS — I6529 Occlusion and stenosis of unspecified carotid artery: Secondary | ICD-10-CM

## 2014-04-05 DIAGNOSIS — R9439 Abnormal result of other cardiovascular function study: Secondary | ICD-10-CM

## 2014-04-05 NOTE — Assessment & Plan Note (Signed)
Patient has a history of CAD status post myocardial infarction in 2004. She's had multiple heart attacks since and has had multiple stents implanted in [facility] last one being in 2009. 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 A recent Myoview stress test showed distal anteroapical ischemia based on this, I am going to suggest we proceed with operation diagnostic coronary arteriography. I thoroughly explained the risks and benefits.

## 2014-04-05 NOTE — Progress Notes (Signed)
04/05/2014 Diana Kennedy   30-Oct-1955  [id number]  Primary Physician Diana Kennedy,Diana R, PA-C Primary Cardiologist: Runell GessJonathan J. Berry MD Diana Kennedy,FACC,FAHA, FSCAI   HPI:  The patient is a 58 year old mildly-overweight single Caucasian female, mother of 3 and grandmother to 5 grandchildren, who is currently unemployed. She [place] to [place] a [employer]. She is referred by Diana FeltsMike Duran, PA-C, for evaluation of carotid disease.   Her cardiac risk-factor profile is positive for ongoing tobacco abuse of 1/2 pack to 1 pack per day, having smoked for 30 years. I did counsel her for greater than 3 minutes regarding smoking cessation. She has treated hypertension as well as a family history of heart disease with a father who had an MI at age 58. 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 Her last catheterization was in 2009. She apparently has had 5 stents [facility]. She continues to complain of exertional chest pain. She has never had a stroke. Diana Kennedy follows her lipid profile.  Since I saw her a year ago she's been clinically stable. Present carotid Dopplers revealed stable disease with mild right and moderate left ICA stenosis patient remains neurologically asymptomatic. It did also suggest significant right subclavian artery stenosis with to and fro left vertebral filling. She does complain of right upper extremity claudication.  On 02/08/14 I performed arch angiography as well as angiography of her subclavian arteries revealing a high-grade proximal right subclavian artery stenosis with post stenotic dilatation. I thought the best option was carotid to subclavian bypass given its proximity to the common carotid artery. She also complains of exertional chest pain. A recent Myoview stress test showed anteroapical ischemia. Based on this, I have decided to proceed with operation cardiac catheterization.    Current Outpatient  Prescriptions  Medication Sig Dispense Refill  . ALPRAZolam (XANAX) 1 MG tablet Take 1 tablet by mouth every 6 (six) hours as needed for anxiety.       Diana Kennedy. FLUoxetine (PROZAC) 40 MG capsule Take 1 capsule by mouth daily.      Diana Kennedy. HYDROmorphone HCl 32 MG T24A Take 1 tablet by mouth daily.      Diana Kennedy. levothyroxine (SYNTHROID, LEVOTHROID) 100 MCG tablet Take 1 tablet by mouth every morning.      Diana Kennedy. LINZESS 290 MCG CAPS capsule Take 1 capsule by mouth daily.      Diana Kennedy. LYRICA 200 MG capsule Take 1 capsule by mouth daily.      . methocarbamol (ROBAXIN) 500 MG tablet Take 1 tablet by mouth 3 (three) times daily.      . Oxycodone HCl 20 MG TABS Take 1 tablet by mouth 5 (five) times daily.      . pravastatin (PRAVACHOL) 20 MG tablet Take 1 tablet by mouth daily.      Diana Kennedy. PROAIR HFA 108 (90 BASE) MCG/ACT inhaler Inhale 1 puff into the lungs every 4 (four) hours as needed for wheezing.       . promethazine (PHENERGAN) 25 MG tablet Take 1 tablet by mouth every 6 (six) hours as needed for nausea.       Diana Kennedy. zolpidem (AMBIEN) 10 MG tablet        No current facility-administered medications for this visit.    Allergies  Allergen Reactions  . Morphine And Related Itching    History   Social History  . Marital Status: Single    Spouse Name: N/A  Number of Children: N/A  . Years of Education: N/A   Occupational History  . Not on file.   Social History Main Topics  . Smoking status: Current Every Day Smoker -- 0.10 packs/day for 20 years    Types: Cigarettes  . Smokeless tobacco: Never Used  . Alcohol Use: No  . Drug Use: No  . Sexual Activity: Not on file   Other Topics Concern  . Not on file   Social History Narrative  . No narrative on file     Review of Systems: General: negative for chills, fever, night sweats or weight changes.  Cardiovascular: negative for chest pain, dyspnea on exertion, edema, orthopnea, palpitations, paroxysmal nocturnal dyspnea or shortness of breath Dermatological:  negative for rash Respiratory: negative for cough or wheezing Urologic: negative for hematuria Abdominal: negative for nausea, vomiting, diarrhea, bright red blood per rectum, melena, or hematemesis Neurologic: negative for visual changes, syncope, or dizziness All other systems reviewed and are otherwise negative except as noted above.    Blood pressure 123/80, pulse 89, height 5\' 7"  (1.702 m), weight 170 lb (77.111 kg).  General appearance: alert and no distress Neck: no adenopathy, no JVD, supple, symmetrical, trachea midline, thyroid not enlarged, symmetric, no tenderness/mass/nodules and right carotid/subclavian bruit Lungs: clear to auscultation bilaterally Heart: regular rate and rhythm, S1, S2 normal, no murmur, click, rub or gallop Extremities: extremities normal, atraumatic, no cyanosis or edema  EKG not performed today  ASSESSMENT AND PLAN:   Coronary artery disease involving native coronary artery Patient has a history of CAD status post myocardial infarction in 2004. She's had multiple heart attacks since and has had multiple stents implanted in [place] [facility] last one being in 2009. 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 A recent Myoview stress test showed distal anteroapical ischemia based on this, I am going to suggest we proceed with operation diagnostic coronary arteriography. I thoroughly explained the risks and benefits.      [name] MD FACP,FACC,FAHA, [facility] 04/05/2014 10:58 AM

## 2014-04-05 NOTE — Patient Instructions (Signed)
Your physician has requested that you have a cardiac catheterization. Cardiac catheterization is used to diagnose and/or treat various heart conditions. Doctors may recommend this procedure for a number of different reasons. The most common reason is to evaluate chest pain. Chest pain can be a symptom of coronary artery disease (CAD), and cardiac catheterization can show whether plaque is narrowing or blocking your heart's arteries. This procedure is also used to evaluate the valves, as well as measure the blood flow and oxygen levels in different parts of your heart. For further information please visit [email or web address].   Following your catheterization, you will not be allowed to drive for 3 days.  No lifting, pushing, or pulling greater that 10 pounds is allowed for 1 week.  You will be required to have the following tests prior to the procedure:  1. Blood work-the blood work can be done no more than 7 days prior to the procedure.  [facility] be done at [facility] lab.  There is one downstairs on the first floor of this building and one in the [facility] ([address])

## 2014-04-06 LAB — BASIC METABOLIC PANEL
BUN: 13 mg/dL (ref 6–23)
CHLORIDE: 101 meq/L (ref 96–112)
CO2: 27 mEq/L (ref 19–32)
CREATININE: 0.82 mg/dL (ref 0.50–1.10)
Calcium: 10 mg/dL (ref 8.4–10.5)
GLUCOSE: 75 mg/dL (ref 70–99)
POTASSIUM: 5 meq/L (ref 3.5–5.3)
Sodium: 139 mEq/L (ref 135–145)

## 2014-04-06 LAB — CBC
HCT: 41.9 % (ref 36.0–46.0)
Hemoglobin: 14 g/dL (ref 12.0–15.0)
MCH: 28.4 pg (ref 26.0–34.0)
MCHC: 33.4 g/dL (ref 30.0–36.0)
MCV: 85 fL (ref 78.0–100.0)
PLATELETS: 304 10*3/uL (ref 150–400)
RBC: 4.93 MIL/uL (ref 3.87–5.11)
RDW: 14.9 % (ref 11.5–15.5)
WBC: 8.4 10*3/uL (ref 4.0–10.5)

## 2014-04-06 LAB — PROTIME-INR
INR: 1 (ref <1.50)
PROTHROMBIN TIME: 13.2 s (ref 11.6–15.2)

## 2014-04-06 LAB — APTT: aPTT: 32 seconds (ref 24–37)

## 2014-04-08 ENCOUNTER — Encounter: Payer: Self-pay | Admitting: *Deleted

## 2014-04-08 ENCOUNTER — Encounter (HOSPITAL_COMMUNITY)
Admission: RE | Disposition: A | Discharge status: Home / Self Care | Payer: Self-pay | Source: Ambulatory Visit | Attending: Cardiovascular Disease

## 2014-04-08 ENCOUNTER — Ambulatory Visit (HOSPITAL_COMMUNITY)
Admission: RE | Admit: 2014-04-08 | Discharge: 2014-04-08 | Disposition: A | Discharge status: Home / Self Care | Payer: Medicare Other | Source: Ambulatory Visit | Attending: Cardiovascular Disease | Admitting: Cardiovascular Disease

## 2014-04-08 DIAGNOSIS — I6529 Occlusion and stenosis of unspecified carotid artery: Secondary | ICD-10-CM | POA: Diagnosis not present

## 2014-04-08 DIAGNOSIS — Z8249 Family history of ischemic heart disease and other diseases of the circulatory system: Secondary | ICD-10-CM | POA: Insufficient documentation

## 2014-04-08 DIAGNOSIS — I208 Other forms of angina pectoris: Secondary | ICD-10-CM

## 2014-04-08 DIAGNOSIS — F17200 Nicotine dependence, unspecified, uncomplicated: Secondary | ICD-10-CM | POA: Diagnosis not present

## 2014-04-08 DIAGNOSIS — I658 Occlusion and stenosis of other precerebral arteries: Secondary | ICD-10-CM | POA: Insufficient documentation

## 2014-04-08 DIAGNOSIS — Z9861 Coronary angioplasty status: Secondary | ICD-10-CM | POA: Diagnosis not present

## 2014-04-08 DIAGNOSIS — I10 Essential (primary) hypertension: Secondary | ICD-10-CM | POA: Insufficient documentation

## 2014-04-08 DIAGNOSIS — I2510 Atherosclerotic heart disease of native coronary artery without angina pectoris: Secondary | ICD-10-CM | POA: Diagnosis present

## 2014-04-08 DIAGNOSIS — E663 Overweight: Secondary | ICD-10-CM | POA: Diagnosis not present

## 2014-04-08 DIAGNOSIS — I252 Old myocardial infarction: Secondary | ICD-10-CM | POA: Insufficient documentation

## 2014-04-08 DIAGNOSIS — E785 Hyperlipidemia, unspecified: Secondary | ICD-10-CM

## 2014-04-08 DIAGNOSIS — I771 Stricture of artery: Secondary | ICD-10-CM | POA: Diagnosis not present

## 2014-04-08 DIAGNOSIS — R9439 Abnormal result of other cardiovascular function study: Secondary | ICD-10-CM

## 2014-04-08 DIAGNOSIS — I25118 Atherosclerotic heart disease of native coronary artery with other forms of angina pectoris: Secondary | ICD-10-CM

## 2014-04-08 HISTORY — PX: LEFT HEART CATHETERIZATION WITH CORONARY ANGIOGRAM: SHX5451

## 2014-04-08 SURGERY — LEFT HEART CATHETERIZATION WITH CORONARY ANGIOGRAM
Anesthesia: LOCAL

## 2014-04-08 MED ORDER — FENTANYL CITRATE 0.05 MG/ML IJ SOLN
INTRAMUSCULAR | Status: AC
  Filled 2014-04-08: qty 2

## 2014-04-08 MED ORDER — ASPIRIN 81 MG PO CHEW: 81.0000 mg | CHEWABLE_TABLET | Freq: Every day | ORAL | Status: DC

## 2014-04-08 MED ORDER — MIDAZOLAM HCL 2 MG/2ML IJ SOLN
INTRAMUSCULAR | Status: AC
  Filled 2014-04-08: qty 2

## 2014-04-08 MED ORDER — HEPARIN (PORCINE) IN NACL 2-0.9 UNIT/ML-% IJ SOLN
INTRAMUSCULAR | Status: AC
  Filled 2014-04-08: qty 1000

## 2014-04-08 MED ORDER — ASPIRIN 81 MG PO CHEW
81.0000 mg | CHEWABLE_TABLET | ORAL | Status: AC
  Administered 2014-04-08: 81 mg via ORAL

## 2014-04-08 MED ORDER — ASPIRIN 81 MG PO CHEW
CHEWABLE_TABLET | ORAL | Status: AC
  Filled 2014-04-08: qty 1

## 2014-04-08 MED ORDER — ACETAMINOPHEN 325 MG PO TABS: 650.0000 mg | ORAL_TABLET | ORAL | Status: DC | PRN

## 2014-04-08 MED ORDER — SODIUM CHLORIDE 0.9 % IV SOLN: INTRAVENOUS | Status: DC

## 2014-04-08 MED ORDER — HYDRALAZINE HCL 20 MG/ML IJ SOLN
10.0000 mg | INTRAMUSCULAR | Status: DC | PRN
Start: 2014-04-08 — End: 2014-04-08

## 2014-04-08 MED ORDER — LIDOCAINE HCL (PF) 1 % IJ SOLN
INTRAMUSCULAR | Status: AC
  Filled 2014-04-08: qty 30

## 2014-04-08 MED ORDER — NITROGLYCERIN 0.2 MG/ML ON CALL CATH LAB
INTRAVENOUS | Status: AC
Start: 2014-04-08 — End: 2014-04-08
  Filled 2014-04-08: qty 1

## 2014-04-08 MED ORDER — ONDANSETRON HCL 4 MG/2ML IJ SOLN: 4.0000 mg | Freq: Four times a day (QID) | INTRAMUSCULAR | Status: DC | PRN

## 2014-04-08 MED ORDER — SODIUM CHLORIDE 0.9 % IJ SOLN: 3.0000 mL | INTRAMUSCULAR | Status: DC | PRN

## 2014-04-08 MED ORDER — SODIUM CHLORIDE 0.9 % IV SOLN
INTRAVENOUS | Status: DC
  Administered 2014-04-08: 11:00:00 via INTRAVENOUS

## 2014-04-08 NOTE — Progress Notes (Signed)
Report received from Renee, RN.

## 2014-04-08 NOTE — Discharge Instructions (Signed)

## 2014-04-08 NOTE — Progress Notes (Signed)
PT RESTING FLAT IN BED NO ACUTE DISTRESS. NO CHEST PAIN, NO SOB. ALERT ORIENTED. SHEATTH PRESENT AT R GROIN REMOVED 1400 WITH NO COMPLICATION. NO HEMATOMA. NO HEMORRHAGE. PULSES INTACT. DENIES NEED TO VOID AT THIS TIME. VS STABLE. ADVISED PT TO REMAIN FLAT IN BED UNTIL INSTRUCTED OTHERWISE.

## 2014-04-08 NOTE — H&P (View-Only) (Signed)
04/05/2014 Diana Kennedy   30-Oct-1955  [id number]  Primary Physician [name],MICHAEL R, PA-C Primary Cardiologist: Runell GessJonathan J. Berry MD Roseanne RenoFACP,FACC,FAHA, FSCAI   HPI:  The patient is a 58 year old mildly-overweight single Caucasian female, mother of 3 and grandmother to 5 grandchildren, who is currently unemployed. She used to [place] at a [employer]. She is referred by [name], PA-C, for evaluation of carotid disease.   Her cardiac risk-factor profile is positive for ongoing tobacco abuse of 1/2 pack to 1 pack per day, having smoked for 30 years. I did counsel her for greater than 3 minutes regarding smoking cessation. She has treated hypertension as well as a family history of heart disease with a father who had an MI at age 58. 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 Her last catheterization was in 2009. She apparently has had 5 stents performed [facility]. She continues to complain of exertional chest pain. She has never had a stroke. [name] follows her lipid profile.  Since I saw her a year ago she's been clinically stable. Present carotid Dopplers revealed stable disease with mild right and moderate left ICA stenosis patient remains neurologically asymptomatic. It did also suggest significant right subclavian artery stenosis with to and fro left vertebral filling. She does complain of right upper extremity claudication.  On 02/08/14 I performed arch angiography as well as angiography of her subclavian arteries revealing a high-grade proximal right subclavian artery stenosis with post stenotic dilatation. I thought the best option was carotid to subclavian bypass given its proximity to the common carotid artery. She also complains of exertional chest pain. A recent Myoview stress test showed anteroapical ischemia. Based on this, I have decided to proceed with operation cardiac catheterization.    Current Outpatient  Prescriptions  Medication Sig Dispense Refill  . ALPRAZolam (XANAX) 1 MG tablet Take 1 tablet by mouth every 6 (six) hours as needed for anxiety.       [name]. FLUoxetine (PROZAC) 40 MG capsule Take 1 capsule by mouth daily.      [name]. HYDROmorphone HCl 32 MG T24A Take 1 tablet by mouth daily.      [name]. levothyroxine (SYNTHROID, LEVOTHROID) 100 MCG tablet Take 1 tablet by mouth every morning.      [name]. LINZESS 290 MCG CAPS capsule Take 1 capsule by mouth daily.      [name]. LYRICA 200 MG capsule Take 1 capsule by mouth daily.      . methocarbamol (ROBAXIN) 500 MG tablet Take 1 tablet by mouth 3 (three) times daily.      . Oxycodone HCl 20 MG TABS Take 1 tablet by mouth 5 (five) times daily.      . pravastatin (PRAVACHOL) 20 MG tablet Take 1 tablet by mouth daily.      [name]. PROAIR HFA 108 (90 BASE) MCG/ACT inhaler Inhale 1 puff into the lungs every 4 (four) hours as needed for wheezing.       . promethazine (PHENERGAN) 25 MG tablet Take 1 tablet by mouth every 6 (six) hours as needed for nausea.       [name]. zolpidem (AMBIEN) 10 MG tablet        No current facility-administered medications for this visit.    Allergies  Allergen Reactions  . Morphine And Related Itching    History   Social History  . Marital Status: Single    Spouse Name: N/A  Number of Children: N/A  . Years of Education: N/A   Occupational History  . Not on file.   Social History Main Topics  . Smoking status: Current Every Day Smoker -- 0.10 packs/day for 20 years    Types: Cigarettes  . Smokeless tobacco: Never Used  . Alcohol Use: No  . Drug Use: No  . Sexual Activity: Not on file   Other Topics Concern  . Not on file   Social History Narrative  . No narrative on file     Review of Systems: General: negative for chills, fever, night sweats or weight changes.  Cardiovascular: negative for chest pain, dyspnea on exertion, edema, orthopnea, palpitations, paroxysmal nocturnal dyspnea or shortness of breath Dermatological:  negative for rash Respiratory: negative for cough or wheezing Urologic: negative for hematuria Abdominal: negative for nausea, vomiting, diarrhea, bright red blood per rectum, melena, or hematemesis Neurologic: negative for visual changes, syncope, or dizziness All other systems reviewed and are otherwise negative except as noted above.    Blood pressure 123/80, pulse 89, height 5\' 7"  (1.702 m), weight 170 lb (77.111 kg).  General appearance: alert and no distress Neck: no adenopathy, no JVD, supple, symmetrical, trachea midline, thyroid not enlarged, symmetric, no tenderness/mass/nodules and right carotid/subclavian bruit Lungs: clear to auscultation bilaterally Heart: regular rate and rhythm, S1, S2 normal, no murmur, click, rub or gallop Extremities: extremities normal, atraumatic, no cyanosis or edema  EKG not performed today  ASSESSMENT AND PLAN:   Coronary artery disease involving native coronary artery Patient has a history of CAD status post myocardial infarction in 2004. She's had multiple heart attacks since and has had multiple stents implanted in [place] [facility] last one being in 2009. 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 A recent Myoview stress test showed distal anteroapical ischemia based on this, I am going to suggest we proceed with operation diagnostic coronary arteriography. I thoroughly explained the risks and benefits.      [name] MD FACP,FACC,FAHA, [facility] 04/05/2014 10:58 AM

## 2014-04-08 NOTE — CV Procedure (Signed)
Diana Kennedy is a 58 y.o. female    [id number] LOCATION:  FACILITY: [facility]  PHYSICIAN: Nanetta BattyJonathan Sindia Kowalczyk, M.D. August 15, 1956   DATE OF PROCEDURE:  04/08/2014  DATE OF DISCHARGE:     CARDIAC CATHETERIZATION     History obtained from chart review.The patient is a 58 year old mildly-overweight single Caucasian female, mother of 3 and grandmother to 5 grandchildren, who is currently unemployed. She used to [place] a [employer]. She is referred by [name], PA-C, for evaluation of carotid disease.   Her cardiac risk-factor profile is positive for ongoing tobacco abuse of 1/2 pack to 1 pack per day, having smoked for 30 years. I did counsel her for greater than 3 minutes regarding smoking cessation. She has treated hypertension as well as a family history of heart disease with a father who had an MI at age 58. 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 Her last catheterization was in 2009. She apparently has had 5 stents performed [facility]. She continues to complain of exertional chest pain. She has never had a stroke. [name] follows her lipid profile.  Since I saw her a year ago she's been clinically stable. Present carotid Dopplers revealed stable disease with mild right and moderate left ICA stenosis patient remains neurologically asymptomatic. He did also suggest significant right subclavian artery stenosis with to and fro left vertebral filling. She does complain of left upper extremity claudication.    PROCEDURE DESCRIPTION:   The patient was brought to the second floor [facility] lab in the postabsorptive state. She was premedicated with Valium 5 mg by mouth, IV Versed and fentanyl. Her right groin was prepped and shaved in usual sterile fashion. Xylocaine 1% was used for local anesthesia. A 5 French sheath was inserted into the right common femoral artery using standard Seldinger technique. 5 French right and  left Judkins diagnostic catheters along with a 5 French pigtail catheter were used for selective coronary angiography and left ventriculography respectively. Omnipaque dye was used for the entirety of the case. Retrograde aortic, left ventricular end back pressures were recorded. A total of 75 cc of contrast was administered the patient.   HEMODYNAMICS:    AO SYSTOLIC/AO DIASTOLIC: 118/65   LV SYSTOLIC/LV DIASTOLIC: 119/13  ANGIOGRAPHIC RESULTS:   1. Left main; normal  2. LAD; the proximal LAD that was widely patent. The first diagonal branch was moderate in size and had a 40-50% ostial stenosis. The second diagonal branch was small, arose from the mid third the vessel and had a 95% ostial stenosis. 3. Left circumflex; nondominant with a patent stent in the mid AV groove circumflex. The proximal third of the circumflex had diffuse 40-50% stenosis.[name].  4. Right coronary artery; the RCA was a dominant vessel. There were multiple stents in the proximal mid and distal vessel. The proximal PDA had 50-70% stenosis in the mid PDA had 50-60% stenosis. This was a small vessel in the 1.7 mm range. The mid RCA had 40-50% fairly focal stenosis just after an acute marginal branch. 5. Left ventriculography; RAO left ventriculogram was performed using  25 mL of Visipaque dye at 12 mL/second. The overall LVEF estimated  60 %Without wall motion abnormalities  IMPRESSION:Ms. Diana Kennedy has a widely patent stent in all 3 vessels otherwise noncritical CAD and normal LV function. I'm not sure the etiology of her Myoview abnormality. It is unclear to me that there is a culprit  vessel. We will continue to feed her medically. The sheath was removed and pressure was held on the groin to achieve hemostasis. The patient left the Cath Lab in stable condition. She will be gently hydrated and discharged on 4 hours. She will see mid-level provider back in one to 2 weeks and me back in 6-8 weeks.  [name] MD,  [facility] 04/08/2014 1:50 PM

## 2014-04-08 NOTE — Progress Notes (Signed)
Site area:Right groin 5FR sheath was removed  Site Prior to Removal:  Level 0  Pressure Applied For 20 MINUTES    Minutes Beginning at 1400  Manual:   Yes.    Patient Status During Pull:  Stable, O2 added at 2liters via [place]  Post Pull Groin Site:  Level 0  Post Pull Instructions Given:  Yes.    Post Pull Pulses Present:  Yes.    Dressing Applied:  Yes.    Comments:  Hemostatis achieved,  Pt remain alert and VS stable.  Pt in Short stay room 17.  Pt denies any discomfort at this time.

## 2014-04-08 NOTE — Interval H&P Note (Signed)
Cath Lab Visit (complete for each Cath Lab visit)  Clinical Evaluation Leading to the Procedure:   ACS: No.  Non-ACS:    Anginal Classification: CCS II  Anti-ischemic medical therapy: No Therapy  Non-Invasive Test Results: Intermediate-risk stress test findings: cardiac mortality 1-3%/year  Prior CABG: No previous CABG      History and Physical Interval Note:  04/08/2014 1:02 PM  Diana Kennedy  has presented today for surgery, with the diagnosis of cp  The various methods of treatment have been discussed with the patient and family. After consideration of risks, benefits and other options for treatment, the patient has consented to  Procedure(s): LEFT HEART CATHETERIZATION WITH CORONARY ANGIOGRAM (N/A) as a surgical intervention .  The patient's history has been reviewed, patient examined, no change in status, stable for surgery.  I have reviewed the patient's chart and labs.  Questions were answered to the patient's satisfaction.     Runell GessBERRY,Siobhan Zaro J

## 2014-04-08 NOTE — Progress Notes (Signed)
Update: [facility].

## 2014-04-08 NOTE — Progress Notes (Signed)
PT REMAINS STABLE, O2 SAT RANGES FROM 88-93 WITH NO SOB NOTED. PLACED ON 2L [place] . HOB FLAT, NO CHEST PAIN. CATH SITE INTACT, NO BLEEDING, NO HEMATOMA.

## 2014-04-10 NOTE — Telephone Encounter (Signed)
Encounter complete. 

## 2014-04-25 ENCOUNTER — Ambulatory Visit: Payer: Medicare Other | Admitting: Cardiology

## 2014-04-26 ENCOUNTER — Encounter: Payer: Self-pay | Admitting: Surgery

## 2014-04-29 ENCOUNTER — Encounter (HOSPITAL_COMMUNITY): Payer: Self-pay | Admitting: Pharmacy Technician

## 2014-04-29 ENCOUNTER — Encounter: Payer: Self-pay | Admitting: Surgery

## 2014-04-29 ENCOUNTER — Ambulatory Visit (INDEPENDENT_AMBULATORY_CARE_PROVIDER_SITE_OTHER): Payer: Medicare Other | Admitting: Surgery

## 2014-04-29 VITALS — BP 118/72 | HR 86 | Temp 99.2°F | Resp 18 | Wt 167.0 lb

## 2014-04-29 DIAGNOSIS — G458 Other transient cerebral ischemic attacks and related syndromes: Secondary | ICD-10-CM | POA: Diagnosis not present

## 2014-04-29 NOTE — Progress Notes (Signed)
Patient name: Diana Kennedy MRN: [id number] DOB: 1955/10/04 Sex: female   Referred by: Dr. Allyson Sabal  Reason for referral:  Chief Complaint  Patient presents with  . Sub-clavian    Right arm numbness x 2-3 months, no injury.  Hx of 5 cardiac stents. REF- Dr. Allyson Sabal    HISTORY OF PRESENT ILLNESS: This is a 58 year old female who is referred by Dr. Allyson Sabal for evaluation of right subclavian stenosis.  The patient reports having pain in her right arm with minimal activity.  She states that she will get pain in her right arm and hand when she lays on her left side.  She also has trouble coming in brushing her hair because her arm gives out.  She did not report any dizziness with activity.  She recently underwent angiography by Dr. Gery Pray which revealed greater than 80% proximal right subclavian stenosis at the innominate artery bifurcation.  Patient has a significant cardiac history.  She has a greater than 30 pack year history for smoking.  She had her first heart attack in 2004 and has had 2-3 heart attacks since then.  Her last cardiac catheterization was in 2009.  She had 5 stents placed in [place].  She continues to complain of exertional chest pain.  She recently had angiography which revealed widely patent stents.  She has known carotid occlusive disease with a 50-69% stenosis on the left, for which he remains asymptomatic, although she does complain of some slurred speech.  The patient suffers from hypercholesterolemia which is managed with a statin.  She is on single agent antiplatelet therapy with aspirin.  Her blood pressure is well controlled.    The patient has chronic pain all over.  She has exertional chest pain she has back pain, pain in both legs.  Past Medical History  Diagnosis Date  . Subclavian artery stenosis, right   . Carotid artery disease   . Tobacco abuse   . Coronary artery disease   . Hyperlipidemia   . Family history of coronary artery disease   . Hypertension      Past Surgical History  Procedure Laterality Date  . Back surgery  2010  . US echocardiography  12/30/2011    mild LVH, mild AOV sclerosis,trace MR,TR,trace/mild physiologic PI  . Coronary angioplasty with stent placement  2004, 2008?    5 vessel     History   Social History  . Marital Status: Single    Spouse Name: N/A    Number of Children: N/A  . Years of Education: N/A   Occupational History  . Not on file.   Social History Main Topics  . Smoking status: Current Every Day Smoker -- 0.10 packs/day for 20 years    Types: Cigarettes  . Smokeless tobacco: Never Used     Comment:  E-cigarettes   . Alcohol Use: No  . Drug Use: No  . Sexual Activity: Not on file   Other Topics Concern  . Not on file   Social History Narrative  . No narrative on file    Family History  Problem Relation Age of Onset  . Cancer Father     lung  . Heart attack Father     Allergies as of 04/29/2014 - Review Complete 04/29/2014  Allergen Reaction Noted  . Morphine and related Itching 01/08/2014    Current Outpatient Prescriptions on File Prior to Visit  Medication Sig Dispense Refill  . albuterol (PROVENTIL HFA;VENTOLIN HFA) 108 (90 BASE) MCG/ACT inhaler  Inhale 1 puff into the lungs every 6 (six) hours as needed for wheezing or shortness of breath.      . ALPRAZolam (XANAX) 1 MG tablet Take 1 mg by mouth 4 (four) times daily as needed for anxiety.      [name]. FLUoxetine (PROZAC) 40 MG capsule Take 1 capsule by mouth daily.      [name]. HYDROmorphone HCl 32 MG T24A Take 32 mg by mouth daily.      [name]. levothyroxine (SYNTHROID, LEVOTHROID) 100 MCG tablet Take 1 tablet by mouth every morning.      . Linaclotide (LINZESS) 290 MCG CAPS capsule Take 290 mcg by mouth daily.      . methocarbamol (ROBAXIN) 500 MG tablet Take 1 tablet by mouth 3 (three) times daily.      . Oxycodone HCl 20 MG TABS Take 1 tablet by mouth 5 (five) times daily.      . pravastatin (PRAVACHOL) 20 MG tablet Take 1 tablet by  mouth daily.      . pregabalin (LYRICA) 200 MG capsule Take 200 mg by mouth daily.      . promethazine (PHENERGAN) 25 MG tablet Take 25 mg by mouth 3 (three) times daily as needed for nausea or vomiting.      [name]. zolpidem (AMBIEN) 10 MG tablet Take 10 mg by mouth at bedtime.       No current facility-administered medications on file prior to visit.     REVIEW OF SYSTEMS: Please see history of present illness, otherwise systems negative.  PHYSICAL EXAMINATION: General: The patient appears their stated age.  Vital signs are BP 118/72  Pulse 86  Temp(Src) 99.2 F (37.3 C) (Oral)  Resp 18  Wt 167 lb (75.751 kg)  SpO2 98% HEENT:  No gross abnormalities Pulmonary: Respirations are non-labored Abdomen: Soft and non-tender  Musculoskeletal: There are no major deformities.   Neurologic: No focal weakness or paresthesias are detected, Skin: There are no ulcer or rashes noted. Psychiatric: The patient has normal affect. Cardiovascular: There is a regular rate and rhythm without significant murmur appreciated.  Right supraclavicular bruit  Diagnostic Studies: I have reviewed her arteriogram which shows greater than 80% right subclavian stenosis at the innominate bifurcation with poststenotic dilatation.  Assessment:  Right subclavian stenosis. Plan: I discussed my impression of her symptoms with regard and correlation to her angiographic findings.  The patient is convinced that she has significant pain in her right arm which is worse with activity but also bothers her at rest.  I discussed the possibility of continued observation vs. surgical bypass, as she is not a candidate for stenting given the location of the lesion.  The patient really wishes to proceed with surgery.  I discussed potential complications including the risk of phrenic nerve injury, lymphatic leak, cardiopulmonary complications.  Her operation has been scheduled for Thursday, August 13.  I was very clear giving her realistic  expectations.  She will likely have residual symptoms after the bypass as I do believe she has symptoms in her right arm that are unrelated to blood flow.  Regardless she stated that if she did have even a small amount of benefit in her right arm she would want to proceed.     Jorge NyV. Wells Brabham IV, M.D. [facility] Office: [phone] Pager:  [phone]

## 2014-04-30 ENCOUNTER — Other Ambulatory Visit: Payer: Self-pay | Admitting: *Deleted

## 2014-05-04 NOTE — Pre-Procedure Instructions (Signed)
Diana Kennedy  05/04/2014   Your procedure is scheduled on:  August 13  [facility] Admitting at 05:30 AM.  Call this number if you have problems the morning of surgery: [phone]   Remember:   Do not eat food or drink liquids after midnight.   Take these medicines the morning of surgery with A SIP OF WATER: Albuterol (if needed), Xanax (if needed), Prozac, Hydromorphone, Levothyroxine, Robaxin, Oxycodone, Linaclotide, Lyrica, Promethazine (if needed)   STOP/ Do not take Aleve, Naproxen, Advil, Ibuprofen, Motrin, Vitamins, Herbs, or Supplements starting today   Do not wear jewelry, make-up or nail polish.  Do not wear lotions, powders, or perfumes. You may wear deodorant.  Do not shave 48 hours prior to surgery. Men may shave face and neck.  Do not bring valuables to the hospital.  [facility] is not responsible for any belongings or valuables.               Contacts, dentures or bridgework may not be worn into surgery.  Leave suitcase in the car. After surgery it may be brought to your room.  For patients admitted to the hospital, discharge time is determined by your treatment team.               Special Instructions: See [facility] Preparing For Surgery   Please read over the following fact sheets that you were given: Pain Booklet, Coughing and Deep Breathing, Blood Transfusion Information and Surgical Site Infection Prevention

## 2014-05-06 ENCOUNTER — Ambulatory Visit (HOSPITAL_COMMUNITY)
Admission: RE | Admit: 2014-05-06 | Discharge: 2014-05-06 | Disposition: A | Discharge status: Home / Self Care | Payer: Medicare Other | Source: Ambulatory Visit | Attending: Anesthesiology | Admitting: Anesthesiology

## 2014-05-06 ENCOUNTER — Encounter (HOSPITAL_COMMUNITY)
Admission: RE | Admit: 2014-05-06 | Discharge: 2014-05-06 | Disposition: A | Discharge status: Home / Self Care | Payer: Medicare Other | Source: Ambulatory Visit | Attending: Surgery | Admitting: Surgery

## 2014-05-06 ENCOUNTER — Other Ambulatory Visit: Payer: Self-pay | Admitting: *Deleted

## 2014-05-06 ENCOUNTER — Encounter (HOSPITAL_COMMUNITY): Payer: Self-pay

## 2014-05-06 DIAGNOSIS — E785 Hyperlipidemia, unspecified: Secondary | ICD-10-CM | POA: Diagnosis present

## 2014-05-06 DIAGNOSIS — Z79899 Other long term (current) drug therapy: Secondary | ICD-10-CM

## 2014-05-06 DIAGNOSIS — I2510 Atherosclerotic heart disease of native coronary artery without angina pectoris: Secondary | ICD-10-CM | POA: Diagnosis present

## 2014-05-06 DIAGNOSIS — G458 Other transient cerebral ischemic attacks and related syndromes: Principal | ICD-10-CM | POA: Diagnosis present

## 2014-05-06 DIAGNOSIS — IMO0001 Reserved for inherently not codable concepts without codable children: Secondary | ICD-10-CM | POA: Diagnosis present

## 2014-05-06 DIAGNOSIS — Z01818 Encounter for other preprocedural examination: Secondary | ICD-10-CM | POA: Insufficient documentation

## 2014-05-06 DIAGNOSIS — Z9861 Coronary angioplasty status: Secondary | ICD-10-CM

## 2014-05-06 DIAGNOSIS — Z8249 Family history of ischemic heart disease and other diseases of the circulatory system: Secondary | ICD-10-CM

## 2014-05-06 DIAGNOSIS — E039 Hypothyroidism, unspecified: Secondary | ICD-10-CM | POA: Diagnosis present

## 2014-05-06 DIAGNOSIS — F17200 Nicotine dependence, unspecified, uncomplicated: Secondary | ICD-10-CM | POA: Diagnosis present

## 2014-05-06 DIAGNOSIS — I10 Essential (primary) hypertension: Secondary | ICD-10-CM | POA: Diagnosis present

## 2014-05-06 HISTORY — DX: Depression, unspecified: F32.A

## 2014-05-06 HISTORY — DX: Fibromyalgia: M79.7

## 2014-05-06 HISTORY — DX: Hypothyroidism, unspecified: E03.9

## 2014-05-06 HISTORY — DX: Major depressive disorder, single episode, unspecified: F32.9

## 2014-05-06 LAB — COMPREHENSIVE METABOLIC PANEL
ALK PHOS: 120 U/L — AB (ref 39–117)
ALT: 10 U/L (ref 0–35)
AST: 15 U/L (ref 0–37)
Albumin: 4.1 g/dL (ref 3.5–5.2)
Anion gap: 15 (ref 5–15)
BILIRUBIN TOTAL: 0.2 mg/dL — AB (ref 0.3–1.2)
BUN: 14 mg/dL (ref 6–23)
CALCIUM: 9.6 mg/dL (ref 8.4–10.5)
CHLORIDE: 99 meq/L (ref 96–112)
CO2: 26 meq/L (ref 19–32)
Creatinine, Ser: 0.82 mg/dL (ref 0.50–1.10)
GFR calc Af Amer: 90 mL/min — ABNORMAL LOW (ref >90)
GFR calc non Af Amer: 77 mL/min — ABNORMAL LOW (ref >90)
Glucose, Bld: 93 mg/dL (ref 70–99)
POTASSIUM: 4.3 meq/L (ref 3.7–5.3)
SODIUM: 140 meq/L (ref 137–147)
Total Protein: 8.6 g/dL — ABNORMAL HIGH (ref 6.0–8.3)

## 2014-05-06 LAB — URINALYSIS, ROUTINE W REFLEX MICROSCOPIC
Glucose, UA: NEGATIVE mg/dL
Hgb urine dipstick: NEGATIVE
KETONES UR: NEGATIVE mg/dL
Nitrite: NEGATIVE
Protein, ur: NEGATIVE mg/dL
Specific Gravity, Urine: 1.022 (ref 1.005–1.030)
Urobilinogen, UA: 1 mg/dL (ref 0.0–1.0)
pH: 5 (ref 5.0–8.0)

## 2014-05-06 LAB — CBC
HCT: 44.6 % (ref 36.0–46.0)
HEMOGLOBIN: 14.6 g/dL (ref 12.0–15.0)
MCH: 29.2 pg (ref 26.0–34.0)
MCHC: 32.7 g/dL (ref 30.0–36.0)
MCV: 89.2 fL (ref 78.0–100.0)
PLATELETS: 303 10*3/uL (ref 150–400)
RBC: 5 MIL/uL (ref 3.87–5.11)
RDW: 14.4 % (ref 11.5–15.5)
WBC: 8.3 10*3/uL (ref 4.0–10.5)

## 2014-05-06 LAB — TYPE AND SCREEN
ABO/RH(D): A NEG
Antibody Screen: NEGATIVE

## 2014-05-06 LAB — URINE MICROSCOPIC-ADD ON

## 2014-05-06 LAB — SURGICAL PCR SCREEN
MRSA, PCR: NEGATIVE
Staphylococcus aureus: NEGATIVE

## 2014-05-06 LAB — PROTIME-INR
INR: 1.01 (ref 0.00–1.49)
Prothrombin Time: 13.3 seconds (ref 11.6–15.2)

## 2014-05-06 LAB — ABO/RH: ABO/RH(D): A NEG

## 2014-05-06 LAB — APTT: aPTT: 30 seconds (ref 24–37)

## 2014-05-06 NOTE — Progress Notes (Addendum)
Anesthesia Note:  Patient is a 58 year old female scheduled for right carotid-subclavian artery bypass on 05/09/14 by Dr. Myra GianottiBrabham.  History includes smoking, right SCA stenosis ( > 80% proximal right SCA stenosis at the innominate artery bifurcation 02/08/14 angiography), carotid artery disease, CAD/MI s/p "[facility]" [facility] '04 and '[facility] (Dr. Dot Beenohrbeck), hypothyroidism, HLD, HTN, back surgery. PCP is listed as Roxanne MinsMichael Duran, PA-C [facility]  Cardiologist is Dr. Nanetta BattyJonathan Berry who referred patient to Dr. Myra GianottiBrabham for further evaluation/treatment of right SCA stenosis. Dr. Allyson SabalBerry recommended a pre-operative cardiac cath due to recent abnormal stress test.  Cath on 04/08/14 showed patent stents and continued medical therapy was recommended.  EKG on 04/08/14 showed: NSR, possible LAE, cannot rule out anterior infarct (age undetermined), RSR prime or QR pattern in V1 suggests RV conduction delay.    Nuclear stress test on 04/02/14 showed: Overall Impression: High risk stress nuclear study with mostly reversible mid to distal anterior defect. Suggestive of ischemia, however, shifting breast artifact cannot be excluded. Clinical correlation is recommended. LV Wall Motion: NL LV Function; NL Wall Motion; EF 75%. This lead to a cardiac cath on 04/08/14 that showed:  ANGIOGRAPHIC RESULTS:  1. Left main; normal  2. LAD; the proximal LAD that was widely patent. The first diagonal branch was moderate in size and had a 40-50% ostial stenosis. The second diagonal branch was small, arose from the mid third the vessel and had a 95% ostial stenosis.  3. Left circumflex; nondominant with a patent stent in the mid AV groove circumflex. The proximal third of the circumflex had diffuse 40-50% stenosis.[name].  4. Right coronary artery; the RCA was a dominant vessel. There were multiple stents in the proximal mid and distal vessel. The proximal PDA had 50-70% stenosis in the mid PDA had 50-60%  stenosis. This was a small vessel in the 1.7 mm range. The mid RCA had 40-50% fairly focal stenosis just after an acute marginal branch.  5. Left ventriculography; RAO left ventriculogram was performed using 25 mL of Visipaque dye at 12 mL/second. The overall LVEF estimated 60 %Without wall motion abnormalities  IMPRESSION:Ms. Diana Kennedy has a widely patent stent in all 3 vessels otherwise noncritical CAD and normal LV function. I'm not sure the etiology of her Myoview abnormality. It is unclear to me that there is a culprit vessel. We will continue to feed her medically. The sheath was removed and pressure was held on the groin to achieve hemostasis. The patient left the Cath Lab in stable condition. She will be gently hydrated and discharged on 4 hours. She will see mid-level provider back in one to 2 weeks and me back in 6-8 weeks. (Dr. Nanetta BattyJonathan Berry)  Echo on 06/02/13 [facility] showed: Normal study. Normal cardiac chamber sizes and function. Normal valve anatomy and function. No pericardial effusion or intracardiac mass. No intracardiac shunts by 2 D and color flow imaging. Normal thoracic aorta and aortic arch.  Carotid duplex on 12/19/11 showed: Elevated velocities consistent with 50-69% diameter reduction in her right subclavian artery. Right vertebral artery demonstrated abnormal to-fro flow. The right carotid bulb and proximal ICA demonstrated a mild to moderate amount of fibrous plaque but no evidence of a significant diameter reduction, tortuosity or other vascular abnormality. Left internal carotid artery demonstrated elevated velocities consistent with 50-69% diameter reduction.  CXR on 05/06/14 showed: Scoliosis. No acute cardiopulmonary abnormality.  Preoperative labs noted.  UA results already called to [facility]  by the PAT RN. Dr. Myra Gianotti is planning to repeat UA on the day of surgery.  Patient with recent cardiology evaluation with preoperative cath with medical therapy recommended.   No new symptoms reported at PAT today. If no acute changes then I would anticipate that she could proceed as planned.  Velna Ochs [facility] Short Stay Center/Anesthesiology Phone [phone] 05/06/2014 5:57 PM

## 2014-05-06 NOTE — Progress Notes (Addendum)
[place] [place] [facility] [place].   Has not seen him in awhile.   Now sees Dr. Christiane HaJonathan Berry--LOV was 2-3 weeks ago.  Cath recently done.  Pt denies chest pain--she does have back pain from previous surgery.  DA  Called CVTS concerning 'many bacteria' in U[facility].  Da

## 2014-05-08 MED ORDER — SODIUM CHLORIDE 0.9 % IV SOLN: INTRAVENOUS | Status: DC

## 2014-05-08 MED ORDER — DEXTROSE 5 % IV SOLN
1.5000 g | INTRAVENOUS | Status: AC
  Administered 2014-05-09: 1.5 g via INTRAVENOUS
  Filled 2014-05-08 (×2): qty 1.5

## 2014-05-09 ENCOUNTER — Encounter (HOSPITAL_COMMUNITY): Payer: Medicare Other | Admitting: Vascular Surgery

## 2014-05-09 ENCOUNTER — Telehealth: Payer: Self-pay | Admitting: Surgery

## 2014-05-09 ENCOUNTER — Inpatient Hospital Stay (HOSPITAL_COMMUNITY)
Admission: RE | Admit: 2014-05-09 | Discharge: 2014-05-10 | DRG: 039 | Disposition: A | Discharge status: Home / Self Care | Payer: Medicare Other | Source: Ambulatory Visit | Attending: Surgery | Admitting: Surgery

## 2014-05-09 ENCOUNTER — Encounter (HOSPITAL_COMMUNITY)
Admission: RE | Disposition: A | Discharge status: Home / Self Care | Payer: Self-pay | Source: Ambulatory Visit | Attending: Surgery

## 2014-05-09 ENCOUNTER — Inpatient Hospital Stay (HOSPITAL_COMMUNITY): Payer: Medicare Other | Admitting: Anesthesiology

## 2014-05-09 ENCOUNTER — Encounter (HOSPITAL_COMMUNITY): Payer: Self-pay | Admitting: *Deleted

## 2014-05-09 DIAGNOSIS — Z9861 Coronary angioplasty status: Secondary | ICD-10-CM | POA: Diagnosis not present

## 2014-05-09 DIAGNOSIS — I2510 Atherosclerotic heart disease of native coronary artery without angina pectoris: Secondary | ICD-10-CM | POA: Diagnosis present

## 2014-05-09 DIAGNOSIS — I10 Essential (primary) hypertension: Secondary | ICD-10-CM | POA: Diagnosis present

## 2014-05-09 DIAGNOSIS — Z79899 Other long term (current) drug therapy: Secondary | ICD-10-CM | POA: Diagnosis not present

## 2014-05-09 DIAGNOSIS — E039 Hypothyroidism, unspecified: Secondary | ICD-10-CM | POA: Diagnosis present

## 2014-05-09 DIAGNOSIS — IMO0001 Reserved for inherently not codable concepts without codable children: Secondary | ICD-10-CM | POA: Diagnosis present

## 2014-05-09 DIAGNOSIS — R209 Unspecified disturbances of skin sensation: Secondary | ICD-10-CM | POA: Diagnosis present

## 2014-05-09 DIAGNOSIS — G458 Other transient cerebral ischemic attacks and related syndromes: Secondary | ICD-10-CM | POA: Diagnosis present

## 2014-05-09 DIAGNOSIS — Z8249 Family history of ischemic heart disease and other diseases of the circulatory system: Secondary | ICD-10-CM | POA: Diagnosis not present

## 2014-05-09 DIAGNOSIS — F17200 Nicotine dependence, unspecified, uncomplicated: Secondary | ICD-10-CM | POA: Diagnosis present

## 2014-05-09 DIAGNOSIS — I771 Stricture of artery: Secondary | ICD-10-CM | POA: Diagnosis present

## 2014-05-09 DIAGNOSIS — E785 Hyperlipidemia, unspecified: Secondary | ICD-10-CM | POA: Diagnosis present

## 2014-05-09 HISTORY — PX: CAROTID-SUBCLAVIAN BYPASS GRAFT: SHX910

## 2014-05-09 SURGERY — CREATION, BYPASS, ARTERIAL, SUBCLAVIAN TO CAROTID, USING GRAFT
Anesthesia: General | Site: Chest | Laterality: Right

## 2014-05-09 MED ORDER — MIDAZOLAM HCL 2 MG/2ML IJ SOLN
INTRAMUSCULAR | Status: AC
  Filled 2014-05-09: qty 2

## 2014-05-09 MED ORDER — MEPERIDINE HCL 25 MG/ML IJ SOLN: 6.2500 mg | INTRAMUSCULAR | Status: DC | PRN

## 2014-05-09 MED ORDER — SODIUM CHLORIDE 0.9 % IV SOLN: INTRAVENOUS | Status: DC

## 2014-05-09 MED ORDER — OXYCODONE HCL 5 MG PO TABS
ORAL_TABLET | ORAL | Status: AC
  Filled 2014-05-09: qty 1

## 2014-05-09 MED ORDER — LIDOCAINE HCL (CARDIAC) 20 MG/ML IV SOLN
INTRAVENOUS | Status: DC | PRN
  Administered 2014-05-09: 40 mg via INTRAVENOUS
  Administered 2014-05-09: 60 mg via INTRATRACHEAL

## 2014-05-09 MED ORDER — DOCUSATE SODIUM 100 MG PO CAPS: 100.0000 mg | ORAL_CAPSULE | Freq: Every day | ORAL | Status: DC

## 2014-05-09 MED ORDER — OXYCODONE HCL 20 MG PO TABS: 20.0000 mg | ORAL_TABLET | Freq: Every day | ORAL | Status: DC

## 2014-05-09 MED ORDER — HYDRALAZINE HCL 20 MG/ML IJ SOLN: 10.0000 mg | INTRAMUSCULAR | Status: DC | PRN

## 2014-05-09 MED ORDER — PROMETHAZINE HCL 25 MG/ML IJ SOLN: 6.2500 mg | INTRAMUSCULAR | Status: DC | PRN

## 2014-05-09 MED ORDER — SODIUM CHLORIDE 0.9 % IV SOLN
INTRAVENOUS | Status: DC
  Administered 2014-05-09: 30 mL/h via INTRAVENOUS

## 2014-05-09 MED ORDER — ALUM & MAG HYDROXIDE-SIMETH 200-200-20 MG/5ML PO SUSP: 15.0000 mL | ORAL | Status: DC | PRN

## 2014-05-09 MED ORDER — CHLORHEXIDINE GLUCONATE CLOTH 2 % EX PADS: 6.0000 | MEDICATED_PAD | Freq: Once | CUTANEOUS | Status: DC

## 2014-05-09 MED ORDER — PROPOFOL 10 MG/ML IV BOLUS
INTRAVENOUS | Status: AC
  Filled 2014-05-09: qty 20

## 2014-05-09 MED ORDER — OXYCODONE HCL 5 MG/5ML PO SOLN: 5.0000 mg | Freq: Once | ORAL | Status: AC | PRN

## 2014-05-09 MED ORDER — PROMETHAZINE HCL 25 MG PO TABS: 25.0000 mg | ORAL_TABLET | Freq: Three times a day (TID) | ORAL | Status: DC | PRN

## 2014-05-09 MED ORDER — NEOSTIGMINE METHYLSULFATE 10 MG/10ML IV SOLN
INTRAVENOUS | Status: DC | PRN
  Administered 2014-05-09: 3 mg via INTRAVENOUS

## 2014-05-09 MED ORDER — LIDOCAINE HCL (PF) 1 % IJ SOLN
INTRAMUSCULAR | Status: AC
  Filled 2014-05-09: qty 30

## 2014-05-09 MED ORDER — SIMVASTATIN 10 MG PO TABS
10.0000 mg | ORAL_TABLET | Freq: Every day | ORAL | Status: DC
  Administered 2014-05-09: 10 mg via ORAL
  Filled 2014-05-09 (×2): qty 1

## 2014-05-09 MED ORDER — LABETALOL HCL 5 MG/ML IV SOLN
INTRAVENOUS | Status: DC | PRN
  Administered 2014-05-09 (×2): 5 mg via INTRAVENOUS

## 2014-05-09 MED ORDER — METOPROLOL TARTRATE 1 MG/ML IV SOLN: 2.0000 mg | INTRAVENOUS | Status: DC | PRN

## 2014-05-09 MED ORDER — ROCURONIUM BROMIDE 100 MG/10ML IV SOLN
INTRAVENOUS | Status: DC | PRN
  Administered 2014-05-09: 40 mg via INTRAVENOUS

## 2014-05-09 MED ORDER — HYDROMORPHONE HCL ER 8 MG PO T24A
32.0000 mg | EXTENDED_RELEASE_TABLET | Freq: Every day | ORAL | Status: DC
  Administered 2014-05-09: 32 mg via ORAL
  Filled 2014-05-09 (×2): qty 4

## 2014-05-09 MED ORDER — HYDROMORPHONE HCL PF 1 MG/ML IJ SOLN
INTRAMUSCULAR | Status: AC
  Filled 2014-05-09: qty 2

## 2014-05-09 MED ORDER — HEMOSTATIC AGENTS (NO CHARGE) OPTIME
TOPICAL | Status: DC | PRN
  Administered 2014-05-09: 1 via TOPICAL

## 2014-05-09 MED ORDER — PHENYLEPHRINE HCL 10 MG/ML IJ SOLN
10.0000 mg | INTRAVENOUS | Status: DC | PRN
  Administered 2014-05-09: 25 ug/min via INTRAVENOUS

## 2014-05-09 MED ORDER — PHENOL 1.4 % MT LIQD: 1.0000 | OROMUCOSAL | Status: DC | PRN

## 2014-05-09 MED ORDER — DEXAMETHASONE SODIUM PHOSPHATE 4 MG/ML IJ SOLN
INTRAMUSCULAR | Status: DC | PRN
  Administered 2014-05-09: 4 mg via INTRAVENOUS

## 2014-05-09 MED ORDER — ACETAMINOPHEN 325 MG PO TABS: 325.0000 mg | ORAL_TABLET | ORAL | Status: DC | PRN

## 2014-05-09 MED ORDER — HEPARIN SODIUM (PORCINE) 1000 UNIT/ML IJ SOLN
INTRAMUSCULAR | Status: AC
  Filled 2014-05-09: qty 2

## 2014-05-09 MED ORDER — ROCURONIUM BROMIDE 50 MG/5ML IV SOLN
INTRAVENOUS | Status: AC
  Filled 2014-05-09: qty 1

## 2014-05-09 MED ORDER — FLUOXETINE HCL 20 MG PO CAPS
40.0000 mg | ORAL_CAPSULE | Freq: Every day | ORAL | Status: DC
  Administered 2014-05-09 – 2014-05-10 (×2): 40 mg via ORAL
  Filled 2014-05-09 (×2): qty 2

## 2014-05-09 MED ORDER — PROPOFOL 10 MG/ML IV BOLUS
INTRAVENOUS | Status: DC | PRN
  Administered 2014-05-09: 150 mg via INTRAVENOUS

## 2014-05-09 MED ORDER — ASPIRIN EC 81 MG PO TBEC
81.0000 mg | DELAYED_RELEASE_TABLET | Freq: Every day | ORAL | Status: DC
  Administered 2014-05-10: 81 mg via ORAL
  Filled 2014-05-09: qty 1

## 2014-05-09 MED ORDER — HEPARIN SODIUM (PORCINE) 5000 UNIT/ML IJ SOLN
INTRAMUSCULAR | Status: DC | PRN
  Administered 2014-05-09: 08:00:00

## 2014-05-09 MED ORDER — ZOLPIDEM TARTRATE 5 MG PO TABS
5.0000 mg | ORAL_TABLET | Freq: Every day | ORAL | Status: DC
  Administered 2014-05-09: 5 mg via ORAL
  Filled 2014-05-09: qty 1

## 2014-05-09 MED ORDER — GUAIFENESIN-DM 100-10 MG/5ML PO SYRP: 15.0000 mL | ORAL_SOLUTION | ORAL | Status: DC | PRN

## 2014-05-09 MED ORDER — DOPAMINE-DEXTROSE 3.2-5 MG/ML-% IV SOLN: 3.0000 ug/kg/min | INTRAVENOUS | Status: DC

## 2014-05-09 MED ORDER — LINACLOTIDE 290 MCG PO CAPS
290.0000 ug | ORAL_CAPSULE | Freq: Every day | ORAL | Status: DC
  Administered 2014-05-09 – 2014-05-10 (×2): 290 ug via ORAL
  Filled 2014-05-09 (×2): qty 1

## 2014-05-09 MED ORDER — ALPRAZOLAM 0.5 MG PO TABS
1.0000 mg | ORAL_TABLET | Freq: Four times a day (QID) | ORAL | Status: DC | PRN
  Administered 2014-05-09: 1 mg via ORAL

## 2014-05-09 MED ORDER — OXYCODONE HCL 5 MG PO TABS
5.0000 mg | ORAL_TABLET | Freq: Once | ORAL | Status: AC | PRN
  Administered 2014-05-09: 5 mg via ORAL

## 2014-05-09 MED ORDER — ACETAMINOPHEN 650 MG RE SUPP: 325.0000 mg | RECTAL | Status: DC | PRN

## 2014-05-09 MED ORDER — DEXAMETHASONE SODIUM PHOSPHATE 4 MG/ML IJ SOLN
INTRAMUSCULAR | Status: AC
  Filled 2014-05-09: qty 1

## 2014-05-09 MED ORDER — ALBUTEROL SULFATE (2.5 MG/3ML) 0.083% IN NEBU: 2.5000 mg | INHALATION_SOLUTION | Freq: Four times a day (QID) | RESPIRATORY_TRACT | Status: DC | PRN

## 2014-05-09 MED ORDER — PREGABALIN 100 MG PO CAPS
200.0000 mg | ORAL_CAPSULE | Freq: Three times a day (TID) | ORAL | Status: DC
  Administered 2014-05-09 – 2014-05-10 (×3): 200 mg via ORAL
  Filled 2014-05-09 (×2): qty 2
  Filled 2014-05-09: qty 4

## 2014-05-09 MED ORDER — DEXTROSE 5 % IV SOLN
1.5000 g | Freq: Two times a day (BID) | INTRAVENOUS | Status: AC
  Administered 2014-05-09 – 2014-05-10 (×2): 1.5 g via INTRAVENOUS
  Filled 2014-05-09 (×4): qty 1.5

## 2014-05-09 MED ORDER — POTASSIUM CHLORIDE CRYS ER 20 MEQ PO TBCR: 20.0000 meq | EXTENDED_RELEASE_TABLET | Freq: Every day | ORAL | Status: DC | PRN

## 2014-05-09 MED ORDER — OXYCODONE HCL 5 MG PO TABS
20.0000 mg | ORAL_TABLET | Freq: Every day | ORAL | Status: DC
  Administered 2014-05-09 – 2014-05-10 (×5): 20 mg via ORAL
  Filled 2014-05-09 (×5): qty 4

## 2014-05-09 MED ORDER — ONDANSETRON HCL 4 MG/2ML IJ SOLN
INTRAMUSCULAR | Status: AC
  Filled 2014-05-09: qty 2

## 2014-05-09 MED ORDER — PHENYLEPHRINE HCL 10 MG/ML IJ SOLN
INTRAMUSCULAR | Status: DC | PRN
  Administered 2014-05-09: 80 ug via INTRAVENOUS
  Administered 2014-05-09: 40 ug via INTRAVENOUS

## 2014-05-09 MED ORDER — LEVOTHYROXINE SODIUM 100 MCG PO TABS
100.0000 ug | ORAL_TABLET | Freq: Every day | ORAL | Status: DC
  Administered 2014-05-10: 100 ug via ORAL
  Filled 2014-05-09 (×2): qty 1

## 2014-05-09 MED ORDER — PANTOPRAZOLE SODIUM 40 MG PO TBEC
40.0000 mg | DELAYED_RELEASE_TABLET | Freq: Every day | ORAL | Status: DC
  Administered 2014-05-09: 40 mg via ORAL
  Filled 2014-05-09: qty 1

## 2014-05-09 MED ORDER — LACTATED RINGERS IV SOLN
INTRAVENOUS | Status: DC | PRN
  Administered 2014-05-09 (×3): via INTRAVENOUS

## 2014-05-09 MED ORDER — ONDANSETRON HCL 4 MG/2ML IJ SOLN: 4.0000 mg | Freq: Four times a day (QID) | INTRAMUSCULAR | Status: DC | PRN

## 2014-05-09 MED ORDER — FENTANYL CITRATE 0.05 MG/ML IJ SOLN
INTRAMUSCULAR | Status: DC | PRN
  Administered 2014-05-09 (×2): 50 ug via INTRAVENOUS
  Administered 2014-05-09: 100 ug via INTRAVENOUS
  Administered 2014-05-09 (×2): 50 ug via INTRAVENOUS
  Administered 2014-05-09: 100 ug via INTRAVENOUS
  Administered 2014-05-09 (×2): 50 ug via INTRAVENOUS

## 2014-05-09 MED ORDER — ONDANSETRON HCL 4 MG/2ML IJ SOLN
INTRAMUSCULAR | Status: DC | PRN
  Administered 2014-05-09: 4 mg via INTRAVENOUS

## 2014-05-09 MED ORDER — ARTIFICIAL TEARS OP OINT
TOPICAL_OINTMENT | OPHTHALMIC | Status: DC | PRN
  Administered 2014-05-09: 1 via OPHTHALMIC

## 2014-05-09 MED ORDER — ARTIFICIAL TEARS OP OINT
TOPICAL_OINTMENT | OPHTHALMIC | Status: AC
  Filled 2014-05-09: qty 3.5

## 2014-05-09 MED ORDER — ALBUTEROL SULFATE HFA 108 (90 BASE) MCG/ACT IN AERS
INHALATION_SPRAY | RESPIRATORY_TRACT | Status: DC | PRN
  Administered 2014-05-09 (×2): 4 via RESPIRATORY_TRACT

## 2014-05-09 MED ORDER — GLYCOPYRROLATE 0.2 MG/ML IJ SOLN
INTRAMUSCULAR | Status: DC | PRN
  Administered 2014-05-09: 0.4 mg via INTRAVENOUS

## 2014-05-09 MED ORDER — FENTANYL CITRATE 0.05 MG/ML IJ SOLN
INTRAMUSCULAR | Status: AC
  Filled 2014-05-09: qty 5

## 2014-05-09 MED ORDER — MIDAZOLAM HCL 5 MG/5ML IJ SOLN
INTRAMUSCULAR | Status: DC | PRN
  Administered 2014-05-09: 2 mg via INTRAVENOUS

## 2014-05-09 MED ORDER — HYDROMORPHONE HCL PF 1 MG/ML IJ SOLN
0.2500 mg | INTRAMUSCULAR | Status: DC | PRN
  Administered 2014-05-09 (×4): 0.5 mg via INTRAVENOUS

## 2014-05-09 MED ORDER — LABETALOL HCL 5 MG/ML IV SOLN: 10.0000 mg | INTRAVENOUS | Status: DC | PRN

## 2014-05-09 MED ORDER — HEPARIN SODIUM (PORCINE) 1000 UNIT/ML IJ SOLN
INTRAMUSCULAR | Status: DC | PRN
  Administered 2014-05-09: 1000 [IU] via INTRAVENOUS
  Administered 2014-05-09: 8000 [IU] via INTRAVENOUS

## 2014-05-09 MED ORDER — MIDAZOLAM HCL 2 MG/2ML IJ SOLN
0.5000 mg | Freq: Once | INTRAMUSCULAR | Status: AC | PRN
  Administered 2014-05-09: 1 mg via INTRAVENOUS

## 2014-05-09 MED ORDER — 0.9 % SODIUM CHLORIDE (POUR BTL) OPTIME
TOPICAL | Status: DC | PRN
  Administered 2014-05-09: 1000 mL

## 2014-05-09 MED ORDER — METHOCARBAMOL 500 MG PO TABS
500.0000 mg | ORAL_TABLET | Freq: Three times a day (TID) | ORAL | Status: DC
  Administered 2014-05-09 – 2014-05-10 (×3): 500 mg via ORAL
  Filled 2014-05-09 (×6): qty 1

## 2014-05-09 MED ORDER — PROTAMINE SULFATE 10 MG/ML IV SOLN
INTRAVENOUS | Status: DC | PRN
  Administered 2014-05-09 (×2): 10 mg via INTRAVENOUS
  Administered 2014-05-09: 20 mg via INTRAVENOUS
  Administered 2014-05-09: 10 mg via INTRAVENOUS

## 2014-05-09 MED ORDER — HYDROMORPHONE HCL PF 1 MG/ML IJ SOLN: 0.5000 mg | INTRAMUSCULAR | Status: DC | PRN

## 2014-05-09 MED ORDER — ALPRAZOLAM 0.25 MG PO TABS
ORAL_TABLET | ORAL | Status: AC
Start: 2014-05-09 — End: 2014-05-10
  Filled 2014-05-09: qty 4

## 2014-05-09 MED ORDER — LABETALOL HCL 5 MG/ML IV SOLN
INTRAVENOUS | Status: AC
  Filled 2014-05-09: qty 4

## 2014-05-09 MED ORDER — FLUOXETINE HCL 40 MG PO CAPS: 40.0000 mg | ORAL_CAPSULE | Freq: Every day | ORAL | Status: DC

## 2014-05-09 MED ORDER — SODIUM CHLORIDE 0.9 % IV SOLN: 500.0000 mL | Freq: Once | INTRAVENOUS | Status: AC | PRN

## 2014-05-09 MED ORDER — VECURONIUM BROMIDE 10 MG IV SOLR
INTRAVENOUS | Status: AC
  Filled 2014-05-09: qty 10

## 2014-05-09 SURGICAL SUPPLY — 55 items
BAG DECANTER FOR FLEXI CONT (MISCELLANEOUS) ×3 IMPLANT
BLADE 10 SAFETY STRL DISP (BLADE) ×3 IMPLANT
CANISTER SUCTION 2500CC (MISCELLANEOUS) ×3 IMPLANT
CATH ROBINSON RED A/P 18FR (CATHETERS) ×3 IMPLANT
CATH SUCT 10FR WHISTLE TIP (CATHETERS) IMPLANT
CLIP TI MEDIUM 24 (CLIP) ×3 IMPLANT
CLIP TI WIDE RED SMALL 24 (CLIP) ×3 IMPLANT
COVER SURGICAL LIGHT HANDLE (MISCELLANEOUS) ×3 IMPLANT
CRADLE DONUT ADULT HEAD (MISCELLANEOUS) ×3 IMPLANT
DERMABOND ADVANCED (GAUZE/BANDAGES/DRESSINGS) ×2
DERMABOND ADVANCED .7 DNX12 (GAUZE/BANDAGES/DRESSINGS) ×1 IMPLANT
DRAIN CHANNEL 15F RND FF W/TCR (WOUND CARE) IMPLANT
DRAPE WARM FLUID 44X44 (DRAPE) IMPLANT
ELECT REM PT RETURN 9FT ADLT (ELECTROSURGICAL) ×3
ELECTRODE REM PT RTRN 9FT ADLT (ELECTROSURGICAL) ×1 IMPLANT
EVACUATOR SILICONE 100CC (DRAIN) IMPLANT
GLOVE BIO SURGEON STRL SZ 6.5 (GLOVE) ×6 IMPLANT
GLOVE BIO SURGEONS STRL SZ 6.5 (GLOVE) ×3
GLOVE BIOGEL PI IND STRL 6.5 (GLOVE) ×3 IMPLANT
GLOVE BIOGEL PI IND STRL 7.5 (GLOVE) ×1 IMPLANT
GLOVE BIOGEL PI INDICATOR 6.5 (GLOVE) ×6
GLOVE BIOGEL PI INDICATOR 7.5 (GLOVE) ×2
GLOVE ECLIPSE 6.5 STRL STRAW (GLOVE) ×6 IMPLANT
GLOVE SURG SS PI 7.5 STRL IVOR (GLOVE) ×3 IMPLANT
GOWN STRL REUS W/ TWL LRG LVL3 (GOWN DISPOSABLE) ×4 IMPLANT
GOWN STRL REUS W/ TWL XL LVL3 (GOWN DISPOSABLE) ×1 IMPLANT
GOWN STRL REUS W/TWL LRG LVL3 (GOWN DISPOSABLE) ×8
GOWN STRL REUS W/TWL XL LVL3 (GOWN DISPOSABLE) ×2
GRAFT CV STRG 30X7KNIT SFT (Vascular Products) ×1 IMPLANT
GRAFT HEMASHIELD 7MM (Vascular Products) ×2 IMPLANT
HEMOSTAT SNOW SURGICEL 2X4 (HEMOSTASIS) ×3 IMPLANT
KIT BASIN OR (CUSTOM PROCEDURE TRAY) ×3 IMPLANT
KIT ROOM TURNOVER OR (KITS) ×3 IMPLANT
NS IRRIG 1000ML POUR BTL (IV SOLUTION) ×6 IMPLANT
PACK CAROTID (CUSTOM PROCEDURE TRAY) ×3 IMPLANT
PAD ARMBOARD 7.5X6 YLW CONV (MISCELLANEOUS) ×6 IMPLANT
PROBE PENCIL 8 MHZ STRL DISP (MISCELLANEOUS) ×3 IMPLANT
PUNCH AORTIC ROTATE 5MM 8IN (MISCELLANEOUS) ×3 IMPLANT
SHUNT CAROTID BYPASS 10 (VASCULAR PRODUCTS) IMPLANT
SHUNT CAROTID BYPASS 12 (VASCULAR PRODUCTS) IMPLANT
SUT ETHILON 3 0 PS 1 (SUTURE) IMPLANT
SUT PROLENE 5 0 C 1 24 (SUTURE) ×9 IMPLANT
SUT PROLENE 6 0 BV (SUTURE) ×15 IMPLANT
SUT PROLENE 7 0 BV 1 (SUTURE) IMPLANT
SUT SILK 3 0 (SUTURE) ×2
SUT SILK 3-0 18XBRD TIE 12 (SUTURE) ×1 IMPLANT
SUT VIC AB 2-0 CT1 27 (SUTURE) ×2
SUT VIC AB 2-0 CT1 TAPERPNT 27 (SUTURE) ×1 IMPLANT
SUT VIC AB 3-0 SH 27 (SUTURE) ×2
SUT VIC AB 3-0 SH 27X BRD (SUTURE) ×1 IMPLANT
SUT VICRYL 4-0 PS2 18IN ABS (SUTURE) IMPLANT
TOWEL OR 17X24 6PK STRL BLUE (TOWEL DISPOSABLE) ×3 IMPLANT
TOWEL OR 17X26 10 PK STRL BLUE (TOWEL DISPOSABLE) ×3 IMPLANT
TRAY FOLEY CATH 16FRSI W/METER (SET/KITS/TRAYS/PACK) ×3 IMPLANT
WATER STERILE IRR 1000ML POUR (IV SOLUTION) ×3 IMPLANT

## 2014-05-09 NOTE — H&P (View-Only) (Signed)
Patient name: Diana Kennedy MRN: [id number] DOB: 1955/10/04 Sex: female   Referred by: Dr. Allyson Sabal  Reason for referral:  Chief Complaint  Patient presents with  . Sub-clavian    Right arm numbness x 2-3 months, no injury.  Hx of 5 cardiac stents. REF- Dr. Allyson Sabal    HISTORY OF PRESENT ILLNESS: This is a 58 year old female who is referred by Dr. Allyson Sabal for evaluation of right subclavian stenosis.  The patient reports having pain in her right arm with minimal activity.  She states that she will get pain in her right arm and hand when she lays on her left side.  She also has trouble coming in brushing her hair because her arm gives out.  She did not report any dizziness with activity.  She recently underwent angiography by Dr. Gery Pray which revealed greater than 80% proximal right subclavian stenosis at the innominate artery bifurcation.  Patient has a significant cardiac history.  She has a greater than 30 pack year history for smoking.  She had her first heart attack in 2004 and has had 2-3 heart attacks since then.  Her last cardiac catheterization was in 2009.  She had 5 stents placed in [place].  She continues to complain of exertional chest pain.  She recently had angiography which revealed widely patent stents.  She has known carotid occlusive disease with a 50-69% stenosis on the left, for which he remains asymptomatic, although she does complain of some slurred speech.  The patient suffers from hypercholesterolemia which is managed with a statin.  She is on single agent antiplatelet therapy with aspirin.  Her blood pressure is well controlled.    The patient has chronic pain all over.  She has exertional chest pain she has back pain, pain in both legs.  Past Medical History  Diagnosis Date  . Subclavian artery stenosis, right   . Carotid artery disease   . Tobacco abuse   . Coronary artery disease   . Hyperlipidemia   . Family history of coronary artery disease   . Hypertension      Past Surgical History  Procedure Laterality Date  . Back surgery  2010  . US echocardiography  12/30/2011    mild LVH, mild AOV sclerosis,trace MR,TR,trace/mild physiologic PI  . Coronary angioplasty with stent placement  2004, 2008?    5 vessel     History   Social History  . Marital Status: Single    Spouse Name: N/A    Number of Children: N/A  . Years of Education: N/A   Occupational History  . Not on file.   Social History Main Topics  . Smoking status: Current Every Day Smoker -- 0.10 packs/day for 20 years    Types: Cigarettes  . Smokeless tobacco: Never Used     Comment:  E-cigarettes   . Alcohol Use: No  . Drug Use: No  . Sexual Activity: Not on file   Other Topics Concern  . Not on file   Social History Narrative  . No narrative on file    Family History  Problem Relation Age of Onset  . Cancer Father     lung  . Heart attack Father     Allergies as of 04/29/2014 - Review Complete 04/29/2014  Allergen Reaction Noted  . Morphine and related Itching 01/08/2014    Current Outpatient Prescriptions on File Prior to Visit  Medication Sig Dispense Refill  . albuterol (PROVENTIL HFA;VENTOLIN HFA) 108 (90 BASE) MCG/ACT inhaler  Inhale 1 puff into the lungs every 6 (six) hours as needed for wheezing or shortness of breath.      . ALPRAZolam (XANAX) 1 MG tablet Take 1 mg by mouth 4 (four) times daily as needed for anxiety.      [name]. FLUoxetine (PROZAC) 40 MG capsule Take 1 capsule by mouth daily.      [name]. HYDROmorphone HCl 32 MG T24A Take 32 mg by mouth daily.      [name]. levothyroxine (SYNTHROID, LEVOTHROID) 100 MCG tablet Take 1 tablet by mouth every morning.      . Linaclotide (LINZESS) 290 MCG CAPS capsule Take 290 mcg by mouth daily.      . methocarbamol (ROBAXIN) 500 MG tablet Take 1 tablet by mouth 3 (three) times daily.      . Oxycodone HCl 20 MG TABS Take 1 tablet by mouth 5 (five) times daily.      . pravastatin (PRAVACHOL) 20 MG tablet Take 1 tablet by  mouth daily.      . pregabalin (LYRICA) 200 MG capsule Take 200 mg by mouth daily.      . promethazine (PHENERGAN) 25 MG tablet Take 25 mg by mouth 3 (three) times daily as needed for nausea or vomiting.      [name]. zolpidem (AMBIEN) 10 MG tablet Take 10 mg by mouth at bedtime.       No current facility-administered medications on file prior to visit.     REVIEW OF SYSTEMS: Please see history of present illness, otherwise systems negative.  PHYSICAL EXAMINATION: General: The patient appears their stated age.  Vital signs are BP 118/72  Pulse 86  Temp(Src) 99.2 F (37.3 C) (Oral)  Resp 18  Wt 167 lb (75.751 kg)  SpO2 98% HEENT:  No gross abnormalities Pulmonary: Respirations are non-labored Abdomen: Soft and non-tender  Musculoskeletal: There are no major deformities.   Neurologic: No focal weakness or paresthesias are detected, Skin: There are no ulcer or rashes noted. Psychiatric: The patient has normal affect. Cardiovascular: There is a regular rate and rhythm without significant murmur appreciated.  Right supraclavicular bruit  Diagnostic Studies: I have reviewed her arteriogram which shows greater than 80% right subclavian stenosis at the innominate bifurcation with poststenotic dilatation.  Assessment:  Right subclavian stenosis. Plan: I discussed my impression of her symptoms with regard and correlation to her angiographic findings.  The patient is convinced that she has significant pain in her right arm which is worse with activity but also bothers her at rest.  I discussed the possibility of continued observation vs. surgical bypass, as she is not a candidate for stenting given the location of the lesion.  The patient really wishes to proceed with surgery.  I discussed potential complications including the risk of phrenic nerve injury, lymphatic leak, cardiopulmonary complications.  Her operation has been scheduled for Thursday, August 13.  I was very clear giving her realistic  expectations.  She will likely have residual symptoms after the bypass as I do believe she has symptoms in her right arm that are unrelated to blood flow.  Regardless she stated that if she did have even a small amount of benefit in her right arm she would want to proceed.     Jorge NyV. Wells Jarmel Linhardt IV, M.D. [facility] [place] Office: [phone] Pager:  [phone]

## 2014-05-09 NOTE — Transfer of Care (Signed)
Immediate Anesthesia Transfer of Care Note  Patient: Diana Kennedy  Procedure(s) Performed: Procedure(s): Right Carotid Subclavian bypass with 7 mm hemashield graft. (Right)  Patient Location: PACU  Anesthesia Type:General  Level of Consciousness: awake, alert  and oriented  Airway & Oxygen Therapy: Patient Spontanous Breathing and Patient connected to nasal cannula oxygen  Post-op Assessment: Report given to PACU RN, Post -op Vital signs reviewed and stable, Patient moving all extremities X 4 and Patient able to stick tongue midline  Post vital signs: Reviewed and stable  Complications: No apparent anesthesia complications

## 2014-05-09 NOTE — Progress Notes (Signed)
Utilization review completed.  

## 2014-05-09 NOTE — Telephone Encounter (Addendum)
Message copied by Rosalyn ChartersOUX, BONNIE A on Thu May 09, 2014  1:28 PM ------      Message from: [place], [place]      Created: Thu May 09, 2014 12:45 PM      Regarding: Schedule                   ----- Message -----         From: Dara LordsSamantha J Rhyne, PA-C         Sent: 05/09/2014  11:20 AM           To: Vvs Charge Pool            S/p right carotid to subclavian artery bypass grafting 05/09/14.            F/u in 2 weeks with Dr. Myra GianottiBrabham.            Thanks,      Lelon MastSamantha ------  notified patient of post op appt, on 05-27-14 at 8:45 am

## 2014-05-09 NOTE — Op Note (Signed)
Patient name: Diana Kennedy MRN: [id number] DOB: 22-Feb-1956 Sex: female  05/09/2014 Pre-operative Diagnosis: Right subclavian steal syndrome Post-operative diagnosis:  Same Surgeon:  Jorge Ny Assistants:  Doreatha Massed Procedure:   Right subclavian to right common carotid artery bypass graft with 7 mm dacryon graft Anesthesia:  Gen. Blood Loss:  See anesthesia record Specimens:  None  Findings:  Moderately diseased right subclavian artery.  A short segment 7 mm interposition dacryon graft was placed  Indications:  The patient has undergone angiography which reveals a high-grade proximal subclavian stenosis on the right.  The patient does report early on fatigue which is causing her significant disability.  She's today for revascularization  Procedure:  The patient was identified in the holding area and taken to [facility] OR ROOM 11  The patient was then placed supine on the table. general anesthesia was administered.  The patient was prepped and draped in the usual sterile fashion.  A time out was called and antibiotics were administered.  A transverse incision was made 1 fingerbreadth superior to the clavicle beginning just beyond the midline.  Cautery was used to divide the subcutaneous tissue.  The platysma muscle was divided with cautery.  The patient had a fused sternocleidomastoid muscle with no division between her sternal and clavicular heads.  I therefore divided with cautery, the medial half of the sternocleidomastoid.  Identified the internal jugular vein.  This was circumferentially exposed.  The vagus nerve was noted on the posterior medial side.  This was protected throughout.  Then dissected out the common carotid artery.  This was disease free.  Once adequate exposure had been obtained of the carotid artery, it was encircled with a vessel loop.  Attention was then turned towards the lateral side of the internal jugular vein.  The scalene fat pad was reflected laterally.  I  ligated all lymphatic tissue between 3-0 silk ties.  I identified the phrenic nerve on the anterior surface of the anterior scalene.  This was fully mobilized and protected.  Once I had fully exposed the anterior scalene, it was divided with Bovie cautery.  Once the anterior scalene had been divided, I was able to expose the subclavian artery.  This still had residual plaque proximally.  I isolated all the branches.  Once adequate exposure was obtained, the patient was fully heparinized.  I placed a Hanley clamp on the distal subclavian artery and a peripheral DeBakey clamp on the proximal artery.  A #11 blade was used to make an arteriotomy which was extended longitudinally with Potts scissors.  I selected a 7 mm dacryon graft.  This was beveled.  A running end-to-side anastomosis was created with 5-0 Prolene.  Pledgeted repair sutures were required at the heel and toe of the graft for hemostasis.  All clamps were then released.  The graft was then brought posterior to the jugular vein and vagus nerve.  A site was selected on the posterior lateral side of the common carotid artery and marked with an 8 pen.  The carotid artery was then occluded with vascular clamps.  A #11 blade was used to make an arteriotomy which was further opened with a #5 punch.  The graft was cut to the appropriate length and a end-to-side anastomosis was created with running 5-0 Prolene.  Prior to completion, the appropriate flushing maneuvers were performed.  I initially removed the clamp on the common carotid artery followed by the clamp on the bypass graft and lastly on the  distal common carotid artery.  Hand-held Doppler was used to evaluate the signals in all vessels interrogated.  They all had excellent signals.  50 mg of protamine was then administered.  Hemostasis was excellent.  I inspected the wound for lymphatic drainage.  There was none.  I then reapproximated the sternocleidomastoid with horizontal mattress 2-0 Vicryl sutures.   The platysma muscle and subcutaneous tissue were then closed with 3-0 Vicryl the skin was closed with 4-0 Vicryl.  Dermabond was then applied The patient was successfully awakened from anesthesia and found to be neurologically intact with a palpable right radial pulse.  She is taken the recovery room in stable condition there were no complications.   Disposition:  To PACU in stable condition.   Juleen ChinaV. Wells Brabham, M.D. [facility] [place] Office: [phone] Pager:  [phone]

## 2014-05-09 NOTE — Anesthesia Postprocedure Evaluation (Signed)
  Anesthesia Post-op Note  Patient: Diana Kennedy  Procedure(s) Performed: Procedure(s): Right Carotid Subclavian bypass with 7 mm hemashield graft. (Right)  Patient Location: PACU  Anesthesia Type:General  Level of Consciousness: awake, alert , oriented and patient cooperative  Airway and Oxygen Therapy: Patient Spontanous Breathing and Patient connected to nasal cannula oxygen  Post-op Pain: mild  Post-op Assessment: Post-op Vital signs reviewed, Patient's Cardiovascular Status Stable, Respiratory Function Stable, Patent Airway, No signs of Nausea or vomiting and Pain level controlled  Post-op Vital Signs: Reviewed and stable  Last Vitals:  Filed Vitals:   05/09/14 1300  BP:   Pulse: 77  Temp:   Resp: 14    Complications: No apparent anesthesia complications

## 2014-05-09 NOTE — Interval H&P Note (Signed)
History and Physical Interval Note:  05/09/2014 7:26 AM  Diana Kennedy  has presented today for surgery, with the diagnosis of  Stenosis of subclavian artery  The various methods of treatment have been discussed with the patient and family. After consideration of risks, benefits and other options for treatment, the patient has consented to  Procedure(s): BYPASS GRAFT CAROTID-SUBCLAVIAN (Right) as a surgical intervention .  The patient's history has been reviewed, patient examined, no change in status, stable for surgery.  I have reviewed the patient's chart and labs.  Questions were answered to the patient's satisfaction.     Punam Broussard IV, V. WELLS

## 2014-05-09 NOTE — Anesthesia Preprocedure Evaluation (Addendum)
Anesthesia Evaluation  Patient identified by MRN, date of birth, ID band Patient awake    Reviewed: Allergy & Precautions, H&P , NPO status , Patient's Chart, lab work & pertinent test results  History of Anesthesia Complications Negative for: history of anesthetic complications  Airway Mallampati: II TM Distance: >3 FB Neck ROM: Full    Dental  (+) Edentulous Upper, Edentulous Lower   Pulmonary COPD COPD inhaler, Current Smoker,  breath sounds clear to auscultation        Cardiovascular hypertension, Pt. on medications - angina+ CAD, + Cardiac Stents and + Peripheral Vascular Disease Rhythm:Regular Rate:Normal  '14 ECHO: EF 60-65%, valves OK  04/08/14 cath: patent stents, continued medical therapy    Neuro/Psych Chronic back pain: dilaudid daily    GI/Hepatic Neg liver ROS, GERD-  Controlled,  Endo/Other  Hypothyroidism   Renal/GU negative Renal ROS     Musculoskeletal   Abdominal   Peds  Hematology negative hematology ROS (+)   Anesthesia Other Findings   Reproductive/Obstetrics                        Anesthesia Physical Anesthesia Plan  ASA: III  Anesthesia Plan: General   Post-op Pain Management:    Induction: Intravenous  Airway Management Planned: Oral ETT  Additional Equipment: Arterial line  Intra-op Plan:   Post-operative Plan: Extubation in OR  Informed Consent: I have reviewed the patients History and Physical, chart, labs and discussed the procedure including the risks, benefits and alternatives for the proposed anesthesia with the patient or authorized representative who has indicated his/her understanding and acceptance.     Plan Discussed with: CRNA and Surgeon  Anesthesia Plan Comments: (Plan routine monitors, A line, GETA)        Anesthesia Quick Evaluation

## 2014-05-09 NOTE — Anesthesia Procedure Notes (Signed)
Procedure Name: Intubation Date/Time: 05/09/2014 7:53 AM Performed by: [place] Pink Pre-anesthesia Checklist: Patient identified, Emergency Drugs available, Suction available, Patient being monitored and Timeout performed Patient Re-evaluated:Patient Re-evaluated prior to inductionOxygen Delivery Method: Circle system utilized Preoxygenation: Pre-oxygenation with 100% oxygen Intubation Type: IV induction Ventilation: Mask ventilation without difficulty and Oral airway inserted - appropriate to patient size Laryngoscope Size: Mac and 3 Grade View: Grade I Tube type: Oral Tube size: 7.5 mm Number of attempts: 1 Airway Equipment and Method: Stylet and LTA kit utilized Placement Confirmation: ETT inserted through vocal cords under direct vision,  positive ETCO2 and breath sounds checked- equal and bilateral Secured at: 22 cm Tube secured with: Tape Dental Injury: Teeth and Oropharynx as per pre-operative assessment

## 2014-05-09 NOTE — Progress Notes (Signed)
  [facility] Day of Surgery Note  05/09/2014 2:16 PM Day of Surgery  Subjective:  Complaining of a sore throat. No difficulty swallowing. Patient seen in PACU.   Tmax 99.5 BP sys 110s-120s. A-line sys 130s-150s 02 96% 3L  Filed Vitals:   05/09/14 1300  BP:   Pulse: 77  Temp:   Resp: 14    Physical Exam: Incisions:  Right neck incision c/d/i. No hematoma Neuro: grossly intact. Moving all extremities equally. 5/5 grip strength bilaterally. Cardiac: regular rate and rhythm Lungs: some rhonchi anterior, otherwise clear to ausculation.   CBC    Component Value Date/Time   WBC 8.3 05/06/2014 1402   RBC 5.00 05/06/2014 1402   HGB 14.6 05/06/2014 1402   HCT 44.6 05/06/2014 1402   PLT 303 05/06/2014 1402   MCV 89.2 05/06/2014 1402   MCH 29.2 05/06/2014 1402   MCHC 32.7 05/06/2014 1402   RDW 14.4 05/06/2014 1402    BMET    Component Value Date/Time   NA 140 05/06/2014 1402   K 4.3 05/06/2014 1402   CL 99 05/06/2014 1402   CO2 26 05/06/2014 1402   GLUCOSE 93 05/06/2014 1402   BUN 14 05/06/2014 1402   CREATININE 0.82 05/06/2014 1402   CREATININE 0.82 04/05/2014 1259   CALCIUM 9.6 05/06/2014 1402   GFRNONAA 77* 05/06/2014 1402   GFRAA 90* 05/06/2014 1402    INR    Component Value Date/Time   INR 1.01 05/06/2014 1402     Intake/Output Summary (Last 24 hours) at 05/09/14 1416 Last data filed at 05/09/14 1146  Gross per 24 hour  Intake   2400 ml  Output    415 ml  Net   1985 ml     Assessment:  58 y.o. female is s/p:  Right carotid-subclavian bypass graft with dacryon graft.   Day of Surgery  Plan: -Neuro exam intact. No difficulty swallowing.  -Continue pain control.  -Ambulate -Discontinue foley on POD 1.  -To 3S.    Maris BergerKimberly Santosh Petter, PA-C [facility] Office: [phone] Pager: [phone] 05/09/2014 2:16 PM

## 2014-05-10 ENCOUNTER — Encounter (HOSPITAL_COMMUNITY): Payer: Self-pay | Admitting: Surgery

## 2014-05-10 LAB — CBC
HCT: 38.7 % (ref 36.0–46.0)
HEMOGLOBIN: 12.1 g/dL (ref 12.0–15.0)
MCH: 28.7 pg (ref 26.0–34.0)
MCHC: 31.3 g/dL (ref 30.0–36.0)
MCV: 91.9 fL (ref 78.0–100.0)
Platelets: 249 10*3/uL (ref 150–400)
RBC: 4.21 MIL/uL (ref 3.87–5.11)
RDW: 14.4 % (ref 11.5–15.5)
WBC: 10.9 10*3/uL — AB (ref 4.0–10.5)

## 2014-05-10 LAB — BASIC METABOLIC PANEL WITH GFR
Anion gap: 13 (ref 5–15)
BUN: 11 mg/dL (ref 6–23)
CO2: 26 meq/L (ref 19–32)
Calcium: 8.6 mg/dL (ref 8.4–10.5)
Chloride: 103 meq/L (ref 96–112)
Creatinine, Ser: 0.77 mg/dL (ref 0.50–1.10)
GFR calc Af Amer: >90 mL/min
GFR calc non Af Amer: >90 mL/min
Glucose, Bld: 90 mg/dL (ref 70–99)
Potassium: 4.5 meq/L (ref 3.7–5.3)
Sodium: 142 meq/L (ref 137–147)

## 2014-05-10 NOTE — Progress Notes (Signed)
     Subjective  - Having a lot of pain.  She wants to go home today if she feels better.   Objective 87/56 78 98.1 F (36.7 C) (Oral) 12 96%  Intake/Output Summary (Last 24 hours) at 05/10/14 0758 Last data filed at 05/10/14 0743  Gross per 24 hour  Intake   2460 ml  Output   2615 ml  Net   -155 ml    Palpable radial pulses equal Right neck incision C/D/I no hematoma, min. Edema soft. Grip 5/5 equal Heart RRR  Assessment/Planning: POD #1 Right carotid-subclavian bypass graft with dacryon graft. Pain is her biggest complaint Spoke with nursing on encouraging PO pain medications only, ambulating and eating breakfast without difficulty. I will round on her again later this am.     Clinton GallantCOLLINS, Deidrea Gaetz [facility] Center[facility]EN 05/10/2014 7:58 AM --  Laboratory Lab Results:  Recent Labs  05/10/14 0600  WBC 10.9*  HGB 12.1  HCT 38.7  PLT 249   BMET  Recent Labs  05/10/14 0600  NA 142  K 4.5  CL 103  CO2 26  GLUCOSE 90  BUN 11  CREATININE 0.77  CALCIUM 8.6    COAG Lab Results  Component Value Date   INR 1.01 05/06/2014   INR 1.00 04/05/2014   INR 0.89 02/01/2014   No results found for this basename: PTT

## 2014-05-10 NOTE — Care Management Note (Signed)
    Page 1 of 1   05/10/2014     4:12:21 PM CARE MANAGEMENT NOTE 05/10/2014  Patient:  Diana Kennedy,Diana Kennedy   Account Number:  [id number]  Date Initiated:  05/10/2014  Documentation initiated by:  Donn PieriniWEBSTER,Dayle Sherpa  Subjective/Objective Assessment:   Pt admitted with  subclavian artery stenosis s/p OR     Action/Plan:   PTA pt lived at home   Anticipated DC Date:  05/10/2014   Anticipated DC Plan:  HOME/SELF CARE         Choice offered to / List presented to:             Status of service:  Completed, signed off Medicare Important Message given?  NA - LOS <3 / Initial given by admissions (If response is "NO", the following Medicare IM given date fields will be blank) Date Medicare IM given:   Medicare IM given by:   Date Additional Medicare IM given:   Additional Medicare IM given by:    Discharge Disposition:  HOME/SELF CARE  Per UR Regulation:  Reviewed for med. necessity/level of care/duration of stay  If discussed at Long Length of Stay Meetings, dates discussed:    Comments:

## 2014-05-10 NOTE — Progress Notes (Signed)
Ate breakfast without difficulty.  Neck incision clean, dry and intact without hematoma. Still having pain. On po pain meds. BP 96/70. This is her baseline per patient. Ambulated in the halls this am. Will keep her here for lunch and discharge this afternoon.  Diana BergerKimberly Eudelia Hiltunen, PA-C Vascular Surgery [phone]

## 2014-05-14 NOTE — Discharge Summary (Signed)
[facility] Discharge Summary   Patient ID:  Diana Kennedy MRN: [id number] DOB/AGE: 03/11/1956 58 y.o.  Admit date: 05/09/2014 Discharge date: 05/14/2014 Date of Surgery: 05/09/2014 Surgeon: Surgeon(s): Nada LibmanVance W Brabham, MD  Admission Diagnosis: Stenosis of subclavian artery  Discharge Diagnoses:  Stenosis of subclavian artery  Secondary Diagnoses: Past Medical History  Diagnosis Date  . Subclavian artery stenosis, right   . Carotid artery disease   . Tobacco abuse   . Coronary artery disease   . Hyperlipidemia   . Family history of coronary artery disease   . Hypertension   . Hypothyroidism   . Depression   . Fibromyalgia     Procedure(s): Right Carotid Subclavian bypass with 7 mm hemashield graft.  Discharged Condition: good  HPI: This is a 58 year old female who is referred by Dr. Allyson SabalBerry for evaluation of right subclavian stenosis. The patient reports having pain in her right arm with minimal activity. She states that she will get pain in her right arm and hand when she lays on her left side. She also has trouble coming in brushing her hair because her arm gives out. She did not report any dizziness with activity. She recently underwent angiography by Dr. Gery PrayBarry which revealed greater than 80% proximal right subclavian stenosis at the innominate artery bifurcation.       Hospital Course: 05/09/2014 She underwent Right subclavian to right common carotid artery bypass graft with 7 mm dacryon graft.  She was admitted to [facility].  Post-op day #1 she had complaints of pain and was given IV pain medication through out the night.  She has chronic back pain and is under the care of a pain manage,ment doctor.  Spoke with nursing on encouraging PO pain medications only.  She was comfortable on PO pain medication from 8 am until she was discharged home at 4pm.  She was ambulating, taking PO's well and voided independently.      Significant Diagnostic Studies: CBC Lab  Results  Component Value Date   WBC 10.9* 05/10/2014   HGB 12.1 05/10/2014   HCT 38.7 05/10/2014   MCV 91.9 05/10/2014   PLT 249 05/10/2014    BMET    Component Value Date/Time   NA 142 05/10/2014 0600   K 4.5 05/10/2014 0600   CL 103 05/10/2014 0600   CO2 26 05/10/2014 0600   GLUCOSE 90 05/10/2014 0600   BUN 11 05/10/2014 0600   CREATININE 0.77 05/10/2014 0600   CREATININE 0.82 04/05/2014 1259   CALCIUM 8.6 05/10/2014 0600   GFRNONAA >90 05/10/2014 0600   GFRAA >90 05/10/2014 0600   COAG Lab Results  Component Value Date   INR 1.01 05/06/2014   INR 1.00 04/05/2014   INR 0.89 02/01/2014     Disposition:  Discharge to :Home Discharge Instructions   CAROTID Sugery: Call MD for difficulty swallowing or speaking; weakness in arms or legs that is a new symtom; severe headache.  If you have increased swelling in the neck and/or  are having difficulty breathing, CALL 911    Complete by:  As directed      Call MD for:  redness, tenderness, or signs of infection (pain, swelling, bleeding, redness, odor or green/yellow discharge around incision site)    Complete by:  As directed      Call MD for:  redness, tenderness, or signs of infection (pain, swelling, bleeding, redness, odor or green/yellow discharge around incision site)    Complete by:  As directed  Call MD for:  severe or increased pain, loss or decreased feeling  in affected limb(s)    Complete by:  As directed      Call MD for:  severe or increased pain, loss or decreased feeling  in affected limb(s)    Complete by:  As directed      Call MD for:  temperature >100.5    Complete by:  As directed      Call MD for:  temperature >100.5    Complete by:  As directed      Discharge instructions    Complete by:  As directed   May shower with soap and water daily     Discharge wound care:    Complete by:  As directed   Shower daily with soap and water starting 05/11/14     Driving Restrictions    Complete by:  As directed   No driving  for 2 weeks     Driving Restrictions    Complete by:  As directed   No driving for 2 weeks     Lifting restrictions    Complete by:  As directed   No lifting for 2 weeks     Lifting restrictions    Complete by:  As directed   No lifting for 6 weeks     Resume previous diet    Complete by:  As directed      Resume previous diet    Complete by:  As directed             Medication List         albuterol 108 (90 BASE) MCG/ACT inhaler  Commonly known as:  PROVENTIL HFA;VENTOLIN HFA  Inhale 1 puff into the lungs every 6 (six) hours as needed for wheezing or shortness of breath.     ALPRAZolam 1 MG tablet  Commonly known as:  XANAX  Take 1 mg by mouth 4 (four) times daily as needed for anxiety.     aspirin EC 81 MG tablet  Take 81 mg by mouth daily.     FLUoxetine 40 MG capsule  Commonly known as:  PROZAC  Take 40 mg by mouth daily.     HYDROmorphone HCl 32 MG T24a  Take 32 mg by mouth daily.     levothyroxine 100 MCG tablet  Commonly known as:  SYNTHROID, LEVOTHROID  Take 100 mcg by mouth every morning.     LINZESS 290 MCG Caps capsule  Generic drug:  Linaclotide  Take 290 mcg by mouth daily.     methocarbamol 500 MG tablet  Commonly known as:  ROBAXIN  Take 500 mg by mouth 3 (three) times daily.     Oxycodone HCl 20 MG Tabs  Take 20 mg by mouth 5 (five) times daily.     pravastatin 20 MG tablet  Commonly known as:  PRAVACHOL  Take 20 mg by mouth daily.     pregabalin 200 MG capsule  Commonly known as:  LYRICA  Take 200 mg by mouth 3 (three) times daily.     promethazine 25 MG tablet  Commonly known as:  PHENERGAN  Take 25 mg by mouth 3 (three) times daily as needed for nausea or vomiting.     zolpidem 10 MG tablet  Commonly known as:  AMBIEN  Take 10 mg by mouth at bedtime.       Verbal and written Discharge instructions given to the patient. Wound care per Discharge AVS  Follow-up Information   Follow up with Myra Gianotti IV, Lala Lund, MD In 2  weeks. (Office will call you to arrange your appt (sent))    Specialty:  Vascular Surgery   Contact information:   [address] [place] [address] [phone]       Signed: Clinton Gallant [facility] 05/14/2014, 1:28 PM

## 2014-05-16 NOTE — Discharge Summary (Signed)
I agree with the above  Diana Kennedy 

## 2014-05-24 ENCOUNTER — Encounter: Payer: Self-pay | Admitting: Surgery

## 2014-05-27 ENCOUNTER — Encounter: Payer: Medicare Other | Admitting: Surgery

## 2014-09-05 ENCOUNTER — Encounter (HOSPITAL_COMMUNITY): Payer: Self-pay | Admitting: Cardiovascular Disease

## 2014-11-25 ENCOUNTER — Telehealth (HOSPITAL_COMMUNITY): Payer: Self-pay | Admitting: *Deleted

## 2016-01-03 IMAGING — CR DG CHEST 2V
2 series · 2 of 2 positions shown · non-contrast
Comparison: None.

CLINICAL DATA: 58-year-old female preoperative study for Coronary
bypass surgery. Peripheral vascular disease, hypertension. Initial
encounter.

EXAM:
CHEST  2 VIEW

[w chest pa]
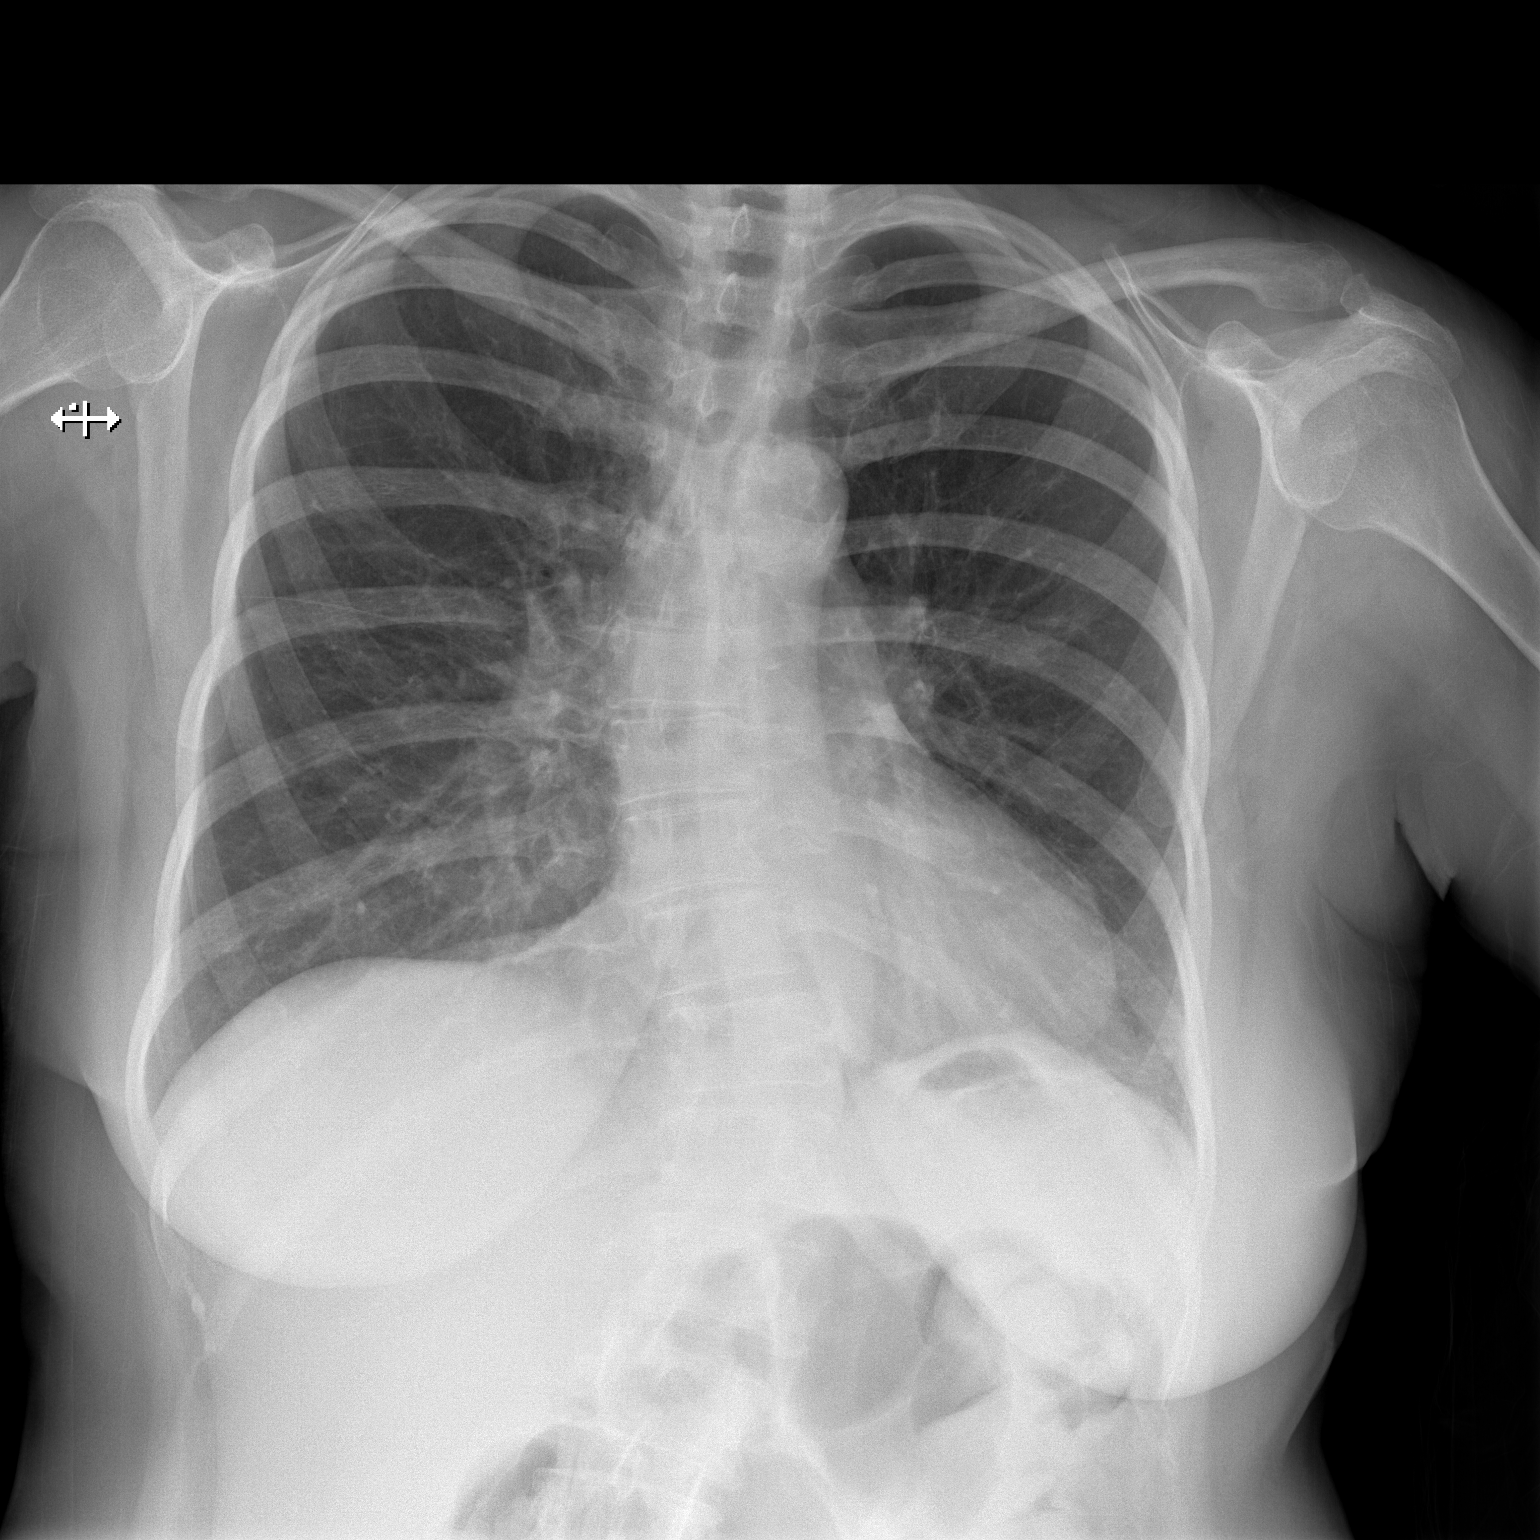

[w chest lat]
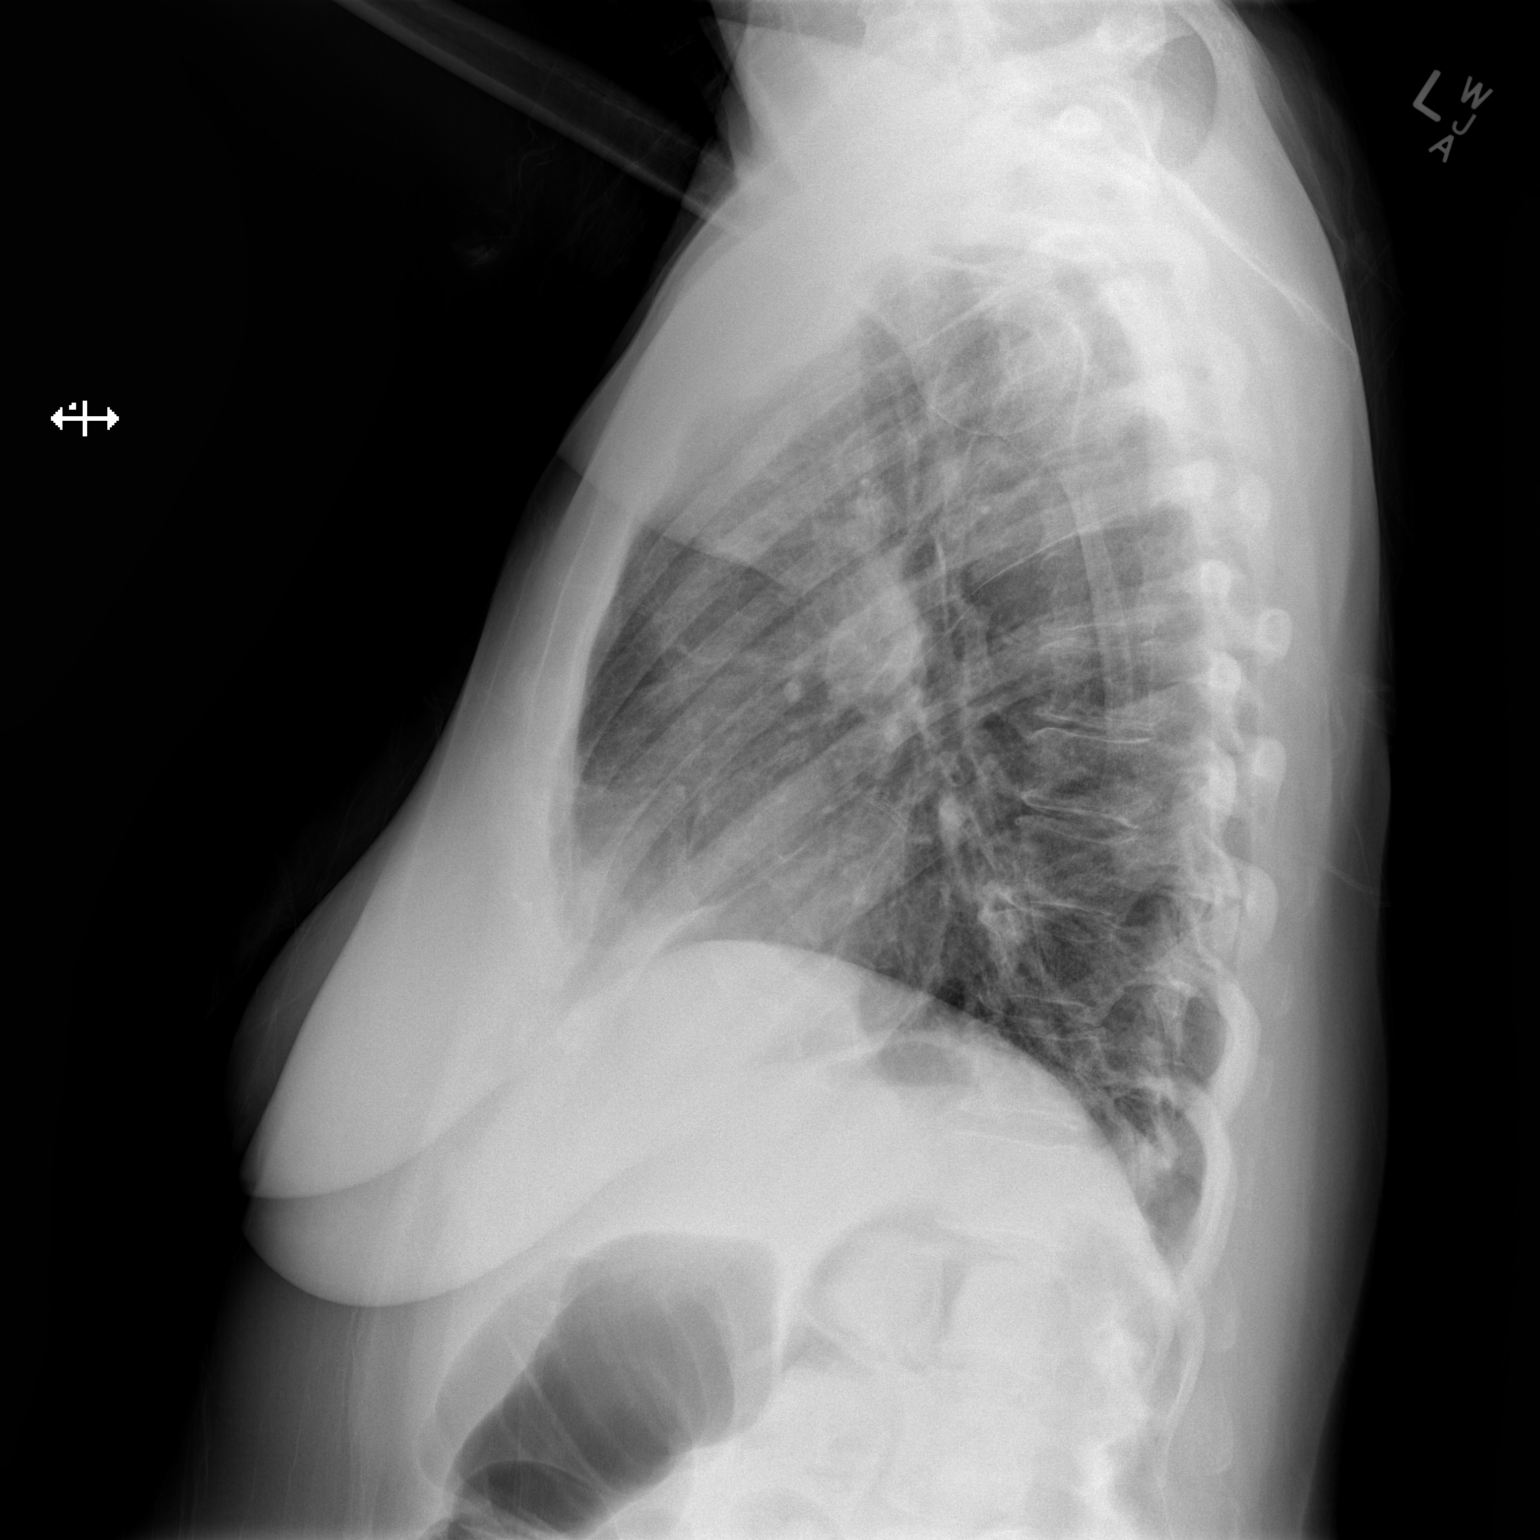

[2 of 2 positions shown; findings below may reference images not displayed]

FINDINGS: Moderate thoracolumbar scoliosis. Lung volumes within normal limits.
Normal cardiac size and mediastinal contours. Visualized tracheal
air column is within normal limits. The lungs are clear. No
pneumothorax or effusion. No acute osseous abnormality identified.
IMPRESSION: Scoliosis.  No acute cardiopulmonary abnormality.

## 2017-09-27 DIAGNOSIS — C541 Malignant neoplasm of endometrium: Secondary | ICD-10-CM

## 2017-09-27 HISTORY — PX: CORONARY ARTERY BYPASS GRAFT: SHX141

## 2017-09-27 HISTORY — DX: Malignant neoplasm of endometrium: C54.1

## 2017-09-27 HISTORY — PX: ABDOMINAL HYSTERECTOMY: SHX81

## 2023-01-07 ENCOUNTER — Other Ambulatory Visit: Payer: Self-pay

## 2023-01-07 ENCOUNTER — Inpatient Hospital Stay (HOSPITAL_COMMUNITY)
Admission: EM | Admit: 2023-01-07 | Discharge: 2023-01-16 | DRG: 657 | Disposition: A | Discharge status: Home / Self Care | Payer: Medicare Other | Attending: Internal Medicine | Admitting: Internal Medicine

## 2023-01-07 ENCOUNTER — Encounter (HOSPITAL_COMMUNITY): Payer: Self-pay

## 2023-01-07 ENCOUNTER — Emergency Department (HOSPITAL_COMMUNITY): Payer: Medicare Other

## 2023-01-07 DIAGNOSIS — R1032 Left lower quadrant pain: Secondary | ICD-10-CM

## 2023-01-07 DIAGNOSIS — R310 Gross hematuria: Secondary | ICD-10-CM | POA: Diagnosis not present

## 2023-01-07 DIAGNOSIS — D6959 Other secondary thrombocytopenia: Secondary | ICD-10-CM | POA: Diagnosis present

## 2023-01-07 DIAGNOSIS — C679 Malignant neoplasm of bladder, unspecified: Secondary | ICD-10-CM | POA: Diagnosis not present

## 2023-01-07 DIAGNOSIS — R109 Unspecified abdominal pain: Secondary | ICD-10-CM | POA: Insufficient documentation

## 2023-01-07 DIAGNOSIS — R319 Hematuria, unspecified: Secondary | ICD-10-CM | POA: Diagnosis not present

## 2023-01-07 DIAGNOSIS — Z79899 Other long term (current) drug therapy: Secondary | ICD-10-CM

## 2023-01-07 DIAGNOSIS — E039 Hypothyroidism, unspecified: Secondary | ICD-10-CM | POA: Insufficient documentation

## 2023-01-07 DIAGNOSIS — Z79890 Hormone replacement therapy (postmenopausal): Secondary | ICD-10-CM

## 2023-01-07 DIAGNOSIS — I2510 Atherosclerotic heart disease of native coronary artery without angina pectoris: Secondary | ICD-10-CM | POA: Diagnosis present

## 2023-01-07 DIAGNOSIS — E860 Dehydration: Secondary | ICD-10-CM | POA: Diagnosis present

## 2023-01-07 DIAGNOSIS — F17210 Nicotine dependence, cigarettes, uncomplicated: Secondary | ICD-10-CM | POA: Diagnosis present

## 2023-01-07 DIAGNOSIS — Z8542 Personal history of malignant neoplasm of other parts of uterus: Secondary | ICD-10-CM

## 2023-01-07 DIAGNOSIS — D649 Anemia, unspecified: Secondary | ICD-10-CM | POA: Diagnosis not present

## 2023-01-07 DIAGNOSIS — N3289 Other specified disorders of bladder: Secondary | ICD-10-CM | POA: Diagnosis present

## 2023-01-07 DIAGNOSIS — N202 Calculus of kidney with calculus of ureter: Secondary | ICD-10-CM | POA: Diagnosis present

## 2023-01-07 DIAGNOSIS — Z8249 Family history of ischemic heart disease and other diseases of the circulatory system: Secondary | ICD-10-CM

## 2023-01-07 DIAGNOSIS — Z7984 Long term (current) use of oral hypoglycemic drugs: Secondary | ICD-10-CM

## 2023-01-07 DIAGNOSIS — D62 Acute posthemorrhagic anemia: Secondary | ICD-10-CM | POA: Diagnosis present

## 2023-01-07 DIAGNOSIS — Z9221 Personal history of antineoplastic chemotherapy: Secondary | ICD-10-CM

## 2023-01-07 DIAGNOSIS — M797 Fibromyalgia: Secondary | ICD-10-CM | POA: Diagnosis present

## 2023-01-07 DIAGNOSIS — E876 Hypokalemia: Secondary | ICD-10-CM | POA: Diagnosis present

## 2023-01-07 DIAGNOSIS — R718 Other abnormality of red blood cells: Secondary | ICD-10-CM | POA: Insufficient documentation

## 2023-01-07 DIAGNOSIS — Z923 Personal history of irradiation: Secondary | ICD-10-CM

## 2023-01-07 DIAGNOSIS — I10 Essential (primary) hypertension: Secondary | ICD-10-CM | POA: Diagnosis present

## 2023-01-07 DIAGNOSIS — Z90710 Acquired absence of both cervix and uterus: Secondary | ICD-10-CM

## 2023-01-07 DIAGNOSIS — Z955 Presence of coronary angioplasty implant and graft: Secondary | ICD-10-CM

## 2023-01-07 DIAGNOSIS — Y842 Radiological procedure and radiotherapy as the cause of abnormal reaction of the patient, or of later complication, without mention of misadventure at the time of the procedure: Secondary | ICD-10-CM | POA: Diagnosis present

## 2023-01-07 DIAGNOSIS — K219 Gastro-esophageal reflux disease without esophagitis: Secondary | ICD-10-CM | POA: Insufficient documentation

## 2023-01-07 DIAGNOSIS — N3041 Irradiation cystitis with hematuria: Secondary | ICD-10-CM | POA: Diagnosis present

## 2023-01-07 DIAGNOSIS — Z981 Arthrodesis status: Secondary | ICD-10-CM

## 2023-01-07 DIAGNOSIS — E785 Hyperlipidemia, unspecified: Secondary | ICD-10-CM | POA: Diagnosis present

## 2023-01-07 DIAGNOSIS — E538 Deficiency of other specified B group vitamins: Secondary | ICD-10-CM | POA: Diagnosis present

## 2023-01-07 DIAGNOSIS — E1165 Type 2 diabetes mellitus with hyperglycemia: Secondary | ICD-10-CM | POA: Insufficient documentation

## 2023-01-07 DIAGNOSIS — E669 Obesity, unspecified: Secondary | ICD-10-CM | POA: Insufficient documentation

## 2023-01-07 DIAGNOSIS — E1151 Type 2 diabetes mellitus with diabetic peripheral angiopathy without gangrene: Secondary | ICD-10-CM | POA: Diagnosis present

## 2023-01-07 DIAGNOSIS — Z6832 Body mass index (BMI) 32.0-32.9, adult: Secondary | ICD-10-CM

## 2023-01-07 DIAGNOSIS — K5900 Constipation, unspecified: Secondary | ICD-10-CM | POA: Diagnosis present

## 2023-01-07 DIAGNOSIS — I714 Abdominal aortic aneurysm, without rupture, unspecified: Secondary | ICD-10-CM | POA: Diagnosis present

## 2023-01-07 DIAGNOSIS — Z7982 Long term (current) use of aspirin: Secondary | ICD-10-CM

## 2023-01-07 DIAGNOSIS — D7589 Other specified diseases of blood and blood-forming organs: Secondary | ICD-10-CM | POA: Diagnosis present

## 2023-01-07 DIAGNOSIS — Z794 Long term (current) use of insulin: Secondary | ICD-10-CM

## 2023-01-07 LAB — URINALYSIS, ROUTINE W REFLEX MICROSCOPIC
Bacteria, UA: NONE SEEN
RBC / HPF: >50 RBC/hpf (ref 0–5)

## 2023-01-07 LAB — COMPREHENSIVE METABOLIC PANEL
ALT: 28 U/L (ref 0–44)
AST: 38 U/L (ref 15–41)
Albumin: 4 g/dL (ref 3.5–5.0)
Alkaline Phosphatase: 77 U/L (ref 38–126)
Anion gap: 10 (ref 5–15)
BUN: 25 mg/dL — ABNORMAL HIGH (ref 8–23)
CO2: 23 mmol/L (ref 22–32)
Calcium: 9.2 mg/dL (ref 8.9–10.3)
Chloride: 99 mmol/L (ref 98–111)
Creatinine, Ser: 1.02 mg/dL — ABNORMAL HIGH (ref 0.44–1.00)
GFR, Estimated: >60 mL/min (ref >60)
Glucose, Bld: 294 mg/dL — ABNORMAL HIGH (ref 70–99)
Potassium: 3.6 mmol/L (ref 3.5–5.1)
Sodium: 132 mmol/L — ABNORMAL LOW (ref 135–145)
Total Bilirubin: 0.6 mg/dL (ref 0.3–1.2)
Total Protein: 7.5 g/dL (ref 6.5–8.1)

## 2023-01-07 LAB — CBC WITH DIFFERENTIAL/PLATELET
Abs Immature Granulocytes: 0.07 10*3/uL (ref 0.00–0.07)
Basophils Absolute: 0 10*3/uL (ref 0.0–0.1)
Basophils Relative: 0 %
Eosinophils Absolute: 0 10*3/uL (ref 0.0–0.5)
Eosinophils Relative: 0 %
HCT: 22 % — ABNORMAL LOW (ref 36.0–46.0)
Hemoglobin: 6.6 g/dL — CL (ref 12.0–15.0)
Immature Granulocytes: 1 %
Lymphocytes Relative: 4 %
Lymphs Abs: 0.4 10*3/uL — ABNORMAL LOW (ref 0.7–4.0)
MCH: 30.7 pg (ref 26.0–34.0)
MCHC: 30 g/dL (ref 30.0–36.0)
MCV: 102.3 fL — ABNORMAL HIGH (ref 80.0–100.0)
Monocytes Absolute: 0.4 10*3/uL (ref 0.1–1.0)
Monocytes Relative: 4 %
Neutro Abs: 8.2 10*3/uL — ABNORMAL HIGH (ref 1.7–7.7)
Neutrophils Relative %: 91 %
Platelets: 173 10*3/uL (ref 150–400)
RBC: 2.15 MIL/uL — ABNORMAL LOW (ref 3.87–5.11)
RDW: 16.8 % — ABNORMAL HIGH (ref 11.5–15.5)
WBC: 9.1 10*3/uL (ref 4.0–10.5)
nRBC: 0.3 % — ABNORMAL HIGH (ref 0.0–0.2)

## 2023-01-07 LAB — PREPARE RBC (CROSSMATCH)

## 2023-01-07 LAB — LIPASE, BLOOD: Lipase: 22 U/L (ref 11–51)

## 2023-01-07 MED ORDER — ONDANSETRON HCL 4 MG/2ML IJ SOLN
4.0000 mg | Freq: Once | INTRAMUSCULAR | Status: AC
  Administered 2023-01-07: 4 mg via INTRAVENOUS
  Filled 2023-01-07: qty 2

## 2023-01-07 MED ORDER — SODIUM CHLORIDE 0.9% IV SOLUTION: Freq: Once | INTRAVENOUS | Status: AC

## 2023-01-07 MED ORDER — MORPHINE SULFATE (PF) 4 MG/ML IV SOLN
4.0000 mg | Freq: Once | INTRAVENOUS | Status: AC
  Administered 2023-01-07: 4 mg via INTRAVENOUS
  Filled 2023-01-07: qty 1

## 2023-01-07 NOTE — ED Provider Notes (Signed)
[facility] AT [facility] Provider Note   CSN: [id number] Arrival date & time: 01/07/23  1835     History  Chief Complaint  Patient presents with   Abdominal Pain   Flank Pain    Diana Kennedy is a 67 y.o. female history of hypothyroidism, recurrent kidney stones, here presenting with left lower quadrant pain.  Patient has been having hematuria for the last month or so.  Patient follows up with urology at [facility].  She apparently had a 10 mm stone and was supposed to get a stent.  Patient states that she is not on any pain medicine and her pain got worse over the last several days.  She states that she felt nauseated as well.  Patient continues to have hematuria.  The history is provided by the patient.       Home Medications Prior to Admission medications   Medication Sig Start Date End Date Taking? Authorizing Provider  albuterol (PROVENTIL HFA;VENTOLIN HFA) 108 (90 BASE) MCG/ACT inhaler Inhale 1 puff into the lungs every 6 (six) hours as needed for wheezing or shortness of breath.    [provider]  ALPRAZolam Prudy Feeler) 1 MG tablet Take 1 mg by mouth 4 (four) times daily as needed for anxiety.    [provider]  aspirin EC 81 MG tablet Take 81 mg by mouth daily.    [provider]  FLUoxetine (PROZAC) 40 MG capsule Take 40 mg by mouth daily.  12/29/13   [provider]  HYDROmorphone HCl 32 MG T24A Take 32 mg by mouth daily.    [provider]  levothyroxine (SYNTHROID, LEVOTHROID) 100 MCG tablet Take 100 mcg by mouth every morning.  12/31/13   [provider]  Linaclotide (LINZESS) 290 MCG CAPS capsule Take 290 mcg by mouth daily.    [provider]  methocarbamol (ROBAXIN) 500 MG tablet Take 500 mg by mouth 3 (three) times daily.  12/29/13   [provider]  Oxycodone HCl 20 MG TABS Take 20 mg by mouth 5 (five) times daily.     [provider]  pravastatin (PRAVACHOL)  20 MG tablet Take 20 mg by mouth daily.  12/29/13   [provider]  pregabalin (LYRICA) 200 MG capsule Take 200 mg by mouth 3 (three) times daily.     [provider]  promethazine (PHENERGAN) 25 MG tablet Take 25 mg by mouth 3 (three) times daily as needed for nausea or vomiting.    [provider]  zolpidem (AMBIEN) 10 MG tablet Take 10 mg by mouth at bedtime.    [provider]      Allergies    Patient has no active allergies.    Review of Systems   Review of Systems  Gastrointestinal:  Positive for abdominal pain.  Genitourinary:  Positive for flank pain.  All other systems reviewed and are negative.   Physical Exam Updated Vital Signs BP (!) 104/55   Pulse 91   Temp 98.3 F (36.8 C) (Oral)   Resp 17   Ht  (1.651 m)   Wt 88.5 kg   SpO2 98%   BMI 32.45 kg/m  Physical Exam Vitals and nursing note reviewed.  Constitutional:      Comments: Uncomfortable   HENT:     Head: Normocephalic.     Mouth/Throat:     Mouth: Mucous membranes are moist.     Pharynx: Oropharynx is clear.  Eyes:  Extraocular Movements: Extraocular movements intact.     Pupils: Pupils are equal, round, and reactive to light.  Cardiovascular:     Rate and Rhythm: Normal rate and regular rhythm.  Pulmonary:     Effort: Pulmonary effort is normal.     Breath sounds: Normal breath sounds.  Abdominal:     General: Abdomen is flat.     Comments: + LLQ tenderness, mild left CVA tenderness as well  Skin:    General: Skin is warm.     Capillary Refill: Capillary refill takes less than 2 seconds.  Neurological:     General: No focal deficit present.     Mental Status: She is oriented to person, place, and time.  Psychiatric:        Mood and Affect: Mood normal.        Behavior: Behavior normal.     ED Results / Procedures / Treatments   Labs (all labs ordered are listed, but only abnormal results are displayed) Labs Reviewed  URINALYSIS, ROUTINE W  REFLEX MICROSCOPIC - Abnormal; Notable for the following components:      Result Value   Color, Urine BIOCHEMICALS MAY BE AFFECTED BY COLOR (*)    APPearance TURBID (*)    Glucose, UA BIOCHEMICALS MAY BE AFFECTED BY COLOR (*)    Hgb urine dipstick BIOCHEMICALS MAY BE AFFECTED BY COLOR (*)    Bilirubin Urine BIOCHEMICALS MAY BE AFFECTED BY COLOR (*)    Ketones, ur BIOCHEMICALS MAY BE AFFECTED BY COLOR (*)    Protein, ur BIOCHEMICALS MAY BE AFFECTED BY COLOR (*)    Nitrite BIOCHEMICALS MAY BE AFFECTED BY COLOR (*)    Leukocytes,Ua BIOCHEMICALS MAY BE AFFECTED BY COLOR (*)    All other components within normal limits  CBC WITH DIFFERENTIAL/PLATELET - Abnormal; Notable for the following components:   RBC 2.15 (*)    Hemoglobin 6.6 (*)    HCT 22.0 (*)    MCV 102.3 (*)    RDW 16.8 (*)    nRBC 0.3 (*)    Neutro Abs 8.2 (*)    Lymphs Abs 0.4 (*)    All other components within normal limits  COMPREHENSIVE METABOLIC PANEL - Abnormal; Notable for the following components:   Sodium 132 (*)    Glucose, Bld 294 (*)    BUN 25 (*)    Creatinine, Ser 1.02 (*)    All other components within normal limits  LIPASE, BLOOD  TYPE AND SCREEN  PREPARE RBC (CROSSMATCH)    EKG None  Radiology CT Renal Stone Study  Result Date: 01/07/2023 CLINICAL DATA:  Left lower quadrant abdominal/flank pain, history of metastatic/recurrent endometrioid adenocarcinoma EXAM: CT ABDOMEN AND PELVIS WITHOUT CONTRAST TECHNIQUE: Multidetector CT imaging of the abdomen and pelvis was performed following the standard protocol without IV contrast. RADIATION DOSE REDUCTION: This exam was performed according to the departmental dose-optimization program which includes automated exposure control, adjustment of the mA and/or kV according to patient size and/or use of iterative reconstruction technique. COMPARISON:  PET-CT dated 12/03/2022 FINDINGS: Lower chest: Mild scarring/atelectasis at the lung bases. Hepatobiliary: Mild  hepatic steatosis focal fatty sparing along the gallbladder fossa. Layering gallbladder sludge (series 2/image 41), without associated inflammatory changes. No intrahepatic or extrahepatic duct dilatation. Pancreas: Within normal limits. Spleen: Within normal limits Adrenals/Urinary Tract: Adrenal glands are within normal limits. Punctate nonobstructing bilateral renal calculi. Additional 12 mm calculus in the right proximal collecting system (coronal image 87). Mild fullness of the left renal collecting system without  frank hydronephrosis. Layering hyperdensity in the right posterolateral bladder (series 2/image 82). Differential considerations include hemorrhage/debris (favored) versus urothelial soft tissue lesion/mass. Stomach/Bowel: Stomach is within normal limits. No evidence of bowel obstruction. Normal appendix (series 2/image 62). Mild to moderate left colonic stool burden. Vascular/Lymphatic: No evidence of abdominal aortic aneurysm. Atherosclerotic calcifications of the abdominal aorta and branch vessels. No suspicious abdominopelvic lymphadenopathy. Reproductive: Status post hysterectomy. Fiducial markers along the surgical bed. No adnexal masses. Other: No abdominopelvic ascites. Musculoskeletal: Status post PLIF at L4-5. Degenerative changes of the visualized thoracolumbar spine. Median sternotomy. IMPRESSION: Layering hyperdensity in the right posterolateral bladder. Differential considerations include hemorrhage/debris (favored) versus urothelial soft tissue lesion/mass. Consider cystoscopy for further evaluation. 12 mm calculus in the right proximal collecting system. Additional punctate nonobstructing bilateral renal calculi. Mild fullness of the left renal collecting system without frank hydronephrosis. Status post hysterectomy with fiducial markers along the surgical bed. No findings specific for recurrent or metastatic disease. Suspected mild constipation, which may account for the patient's  left lower quadrant abdominal pain. Electronically Signed   By: Charline Bills M.D.   On: 01/07/2023 22:13    Procedures Procedures    CRITICAL CARE Performed by: Richardean Canal   Total critical care time: 43 minutes  Critical care time was exclusive of separately billable procedures and treating other patients.  Critical care was necessary to treat or prevent imminent or life-threatening deterioration.  Critical care was time spent personally by me on the following activities: development of treatment plan with patient and/or surrogate as well as nursing, discussions with consultants, evaluation of patient's response to treatment, examination of patient, obtaining history from patient or surrogate, ordering and performing treatments and interventions, ordering and review of laboratory studies, ordering and review of radiographic studies, pulse oximetry and re-evaluation of patient's condition.   Medications Ordered in ED Medications  0.9 %  sodium chloride infusion (Manually program via Guardrails IV Fluids) (has no administration in time range)  morphine (PF) 4 MG/ML injection 4 mg (4 mg Intravenous Given 01/07/23 2114)  ondansetron (ZOFRAN) injection 4 mg (4 mg Intravenous Given 01/07/23 2114)    ED Course/ Medical Decision Making/ A&P                             Medical Decision Making AZARYAH HEATHCOCK is a 67 y.o. female here presenting with left lower quadrant pain.  Ongoing for the last month or so.  Associated with some hematuria.  Patient has a known kidney stone and has been followed with [facility] urology.  Will get CT renal stone to assess for hydro and the size of the stone.  Will also get CBC and CMP and urinalysis.  Will give pain medicine and reassess.  10:26 PM Patient's hemoglobin is 6.6.  Baseline is 12.  Patient is symptomatic from it.  I ordered 1 unit PRBC transfusion.  CT also showed right posterior lateral bladder hyperdensity which is concerning for debris or  hemorrhage or mass.  She also has a right proximal stone with no hydro.  I discussed case with the urologist, Dr. Ronne Binning.  He recommend holding off on Foley since she is not in retention.  They recommend transfusing with blood and also hospitalist admission and they will see patient as a consult.  Problems Addressed: Anemia, unspecified type: acute illness or injury Gross hematuria: acute illness or injury  Amount and/or Complexity of Data Reviewed Labs: ordered. Decision-making details documented  in ED Course. Radiology: ordered and independent interpretation performed. Decision-making details documented in ED Course.  Risk Prescription drug management.    Final Clinical Impression(s) / ED Diagnoses Final diagnoses:  None    Rx / DC Orders ED Discharge Orders     None         Charlynne Pander, MD 01/07/23 2228

## 2023-01-07 NOTE — ED Triage Notes (Signed)
Abdominal pain in LLQ that radiates into flank area. Denies fever, chills, body aches. Has hematuria, but has had this for about a month due to a known kidney stone.

## 2023-01-07 NOTE — ED Triage Notes (Deleted)
Patient presents due to a need for placement. He was brought in by EMS from [facility]. Family does not believe the facility can safely care for the patient so they are seeking a different facility. A&O to self only. He has an indwelling foley.     EMS vitals: 166/78 BP 80 HR 95% SPO2 on room air 16 RR

## 2023-01-07 NOTE — H&P (Incomplete)
History and Physical    Patient: Diana Kennedy UJW:[id number] DOB: 04-20-1956 DOA: 01/07/2023 DOS: the patient was seen and examined on 01/08/2023 PCP: Coralee Rud, PA-C  Patient coming from: Home  Chief Complaint:  Chief Complaint  Patient presents with   Abdominal Pain   Flank Pain   HPI: Diana Kennedy is a 67 y.o. female with medical history significant of hypothyroidism, GERD, recurrent kidney stones, type 2 diabetes mellitus presents to the emergency department due to left lower quadrant pain.  She complained of about 1 month history of hematuria and follows with urology at [facility].  She complained of intermittent left lower abdominal pain which got worse today with severe pain.  Apparently, patient was supposed to get a stent due to a 10 mm stone, she has not been taking any medication for the pain which was associated with nausea without vomiting.  She complained of increased weakness and she continued to have blood in urine.  She presented to the ED for further evaluation and management.  ED Course:  In the emergency department, BP was 146/71 but other vital signs were within normal range.  Workup in the ED showed H/H6.6/22 (this was 10.9/33.2 on 11/27/2022), MCV 102.3.  BMP showed sodium 132, potassium 3.6, chloride 99, bicarb 23, blood glucose 294, BUN 25, creatinine 1.02. CT abdomen and pelvis without contrast showed Layering hyperdensity in the right posterolateral bladder. Differential considerations include hemorrhage/debris (favored) versus urothelial soft tissue lesion/mass. Consider cystoscopy for further evaluation.  12 mm calculus in the right proximal collecting system. Additional punctate nonobstructing bilateral renal calculi. Mild fullness of the left renal collecting system without frank hydronephrosis.  Status post hysterectomy with fiducial markers along the surgical bed. No findings specific for recurrent or metastatic disease. Suspected mild constipation, which  may account for the patient's left lower quadrant abdominal pain. IV morphine 4 mg x 1, Zofran was given.  Allergist on-call (Dr. Ronne Binning) was consulted and recommended holding off on Foley since patient was not having any urinary retention.  Blood transfusion and admission by hospitalist was recommended with plan to follow-up outpatient and consult.  Review of Systems: Review of systems as noted in the HPI. All other systems reviewed and are negative.   Past Medical History:  Diagnosis Date   Carotid artery disease    Coronary artery disease    Depression    Family history of coronary artery disease    Fibromyalgia    Hyperlipidemia    Hypertension    Hypothyroidism    Subclavian artery stenosis, right    Tobacco abuse    Past Surgical History:  Procedure Laterality Date   BACK SURGERY  2010   CAROTID-SUBCLAVIAN BYPASS GRAFT Right 05/09/2014   Procedure: Right Carotid Subclavian bypass with 7 mm hemashield graft.;  Surgeon: Nada Libman, MD;  Location: [facility];  Service: Vascular;  Laterality: Right;   CORONARY ANGIOPLASTY WITH STENT PLACEMENT  2004, 2008?   5 vessel    LEFT HEART CATHETERIZATION WITH CORONARY ANGIOGRAM N/A 04/08/2014   Procedure: LEFT HEART CATHETERIZATION WITH CORONARY ANGIOGRAM;  Surgeon: [name], MD;  Location: [facility] CATH LAB;  Service: Cardiovascular;  Laterality: N/A;   US ECHOCARDIOGRAPHY  12/30/2011   mild LVH, mild AOV sclerosis,trace MR,TR,trace/mild physiologic PI    Social History:  reports that she has been smoking cigarettes. She has a 2.00 pack-year smoking history. She has never used smokeless tobacco. She reports that she does not drink alcohol and does not use  drugs.   No Known Allergies  Family History  Problem Relation Age of Onset   Cancer Father        lung   Heart attack Father      Prior to Admission medications   Medication Sig Start Date End Date Taking? Authorizing Provider  hydrOXYzine (ATARAX) 25 MG tablet Take 25 mg by  mouth every 6 (six) hours as needed for anxiety. 10/08/21  Yes [provider]  levothyroxine (SYNTHROID) 125 MCG tablet Take 125 mcg by mouth daily before breakfast.   Yes [provider]  omeprazole (PRILOSEC) 40 MG capsule Take 40 mg by mouth daily as needed (indigestion).   Yes [provider]  oxyCODONE (ROXICODONE) 15 MG immediate release tablet Take 15 mg by mouth every 6 (six) hours as needed for pain.   Yes [provider]  pregabalin (LYRICA) 200 MG capsule Take 200 mg by mouth 3 (three) times daily.    Yes [provider]  SitaGLIPtin-MetFORMIN HCl (JANUMET XR) 50-1000 MG TB24 Take 1 tablet by mouth daily.   Yes [provider]  XTAMPZA ER 13.5 MG C12A Take 1 capsule by mouth in the morning and at bedtime. 08/13/21  Yes [provider]    Physical Exam: BP (!) 127/54   Pulse 95   Temp 99.7 F (37.6 C) (Oral)   Resp 17   Ht 5\' 5"  (1.651 m)   Wt 88.5 kg   SpO2 94%   BMI 32.45 kg/m   General: 67 y.o. year-old female well developed well nourished in no acute distress.  Alert and oriented x3. HEENT: NCAT, EOMI Neck: Supple, trachea medial Cardiovascular: Regular rate and rhythm with no rubs or gallops.  No thyromegaly or JVD noted.  No lower extremity edema. 2/4 pulses in all 4 extremities. Respiratory: Clear to auscultation with no wheezes or rales. Good inspiratory effort. Abdomen: Soft, tender to palpation of LLQ.  No CVA tenderness.  Nondistended with normal bowel sounds x4 quadrants. Muskuloskeletal: No cyanosis, clubbing or edema noted bilaterally Neuro: CN II-XII intact, strength 5/5 x 4, sensation, reflexes intact Skin: No ulcerative lesions noted or rashes Psychiatry: Judgement and insight appear normal. Mood is appropriate for condition and setting          Labs on Admission:  Basic Metabolic Panel: Recent Labs  Lab 01/07/23 2125  NA 132*  K 3.6  CL 99  CO2 23  GLUCOSE 294*  BUN 25*  CREATININE  1.02*  CALCIUM 9.2   Liver Function Tests: Recent Labs  Lab 01/07/23 2125  AST 38  ALT 28  ALKPHOS 77  BILITOT 0.6  PROT 7.5  ALBUMIN 4.0   Recent Labs  Lab 01/07/23 2125  LIPASE 22   No results for input(s): "AMMONIA" in the last 168 hours. CBC: Recent Labs  Lab 01/07/23 2125  WBC 9.1  NEUTROABS 8.2*  HGB 6.6*  HCT 22.0*  MCV 102.3*  PLT 173   Cardiac Enzymes: No results for input(s): "CKTOTAL", "CKMB", "CKMBINDEX", "TROPONINI" in the last 168 hours.  BNP (last 3 results) No results for input(s): "BNP" in the last 8760 hours.  ProBNP (last 3 results) No results for input(s): "PROBNP" in the last 8760 hours.  CBG: No results for input(s): "GLUCAP" in the last 168 hours.  Radiological Exams on Admission: CT Renal Stone Study  Result Date: 01/07/2023 CLINICAL DATA:  Left lower quadrant abdominal/flank pain, history of metastatic/recurrent endometrioid adenocarcinoma EXAM: CT ABDOMEN AND PELVIS WITHOUT CONTRAST TECHNIQUE: Multidetector CT imaging  of the abdomen and pelvis was performed following the standard protocol without IV contrast. RADIATION DOSE REDUCTION: This exam was performed according to the departmental dose-optimization program which includes automated exposure control, adjustment of the mA and/or kV according to patient size and/or use of iterative reconstruction technique. COMPARISON:  PET-CT dated 12/03/2022 FINDINGS: Lower chest: Mild scarring/atelectasis at the lung bases. Hepatobiliary: Mild hepatic steatosis focal fatty sparing along the gallbladder fossa. Layering gallbladder sludge (series 2/image 41), without associated inflammatory changes. No intrahepatic or extrahepatic duct dilatation. Pancreas: Within normal limits. Spleen: Within normal limits Adrenals/Urinary Tract: Adrenal glands are within normal limits. Punctate nonobstructing bilateral renal calculi. Additional 12 mm calculus in the right proximal collecting system (coronal image 87). Mild  fullness of the left renal collecting system without frank hydronephrosis. Layering hyperdensity in the right posterolateral bladder (series 2/image 82). Differential considerations include hemorrhage/debris (favored) versus urothelial soft tissue lesion/mass. Stomach/Bowel: Stomach is within normal limits. No evidence of bowel obstruction. Normal appendix (series 2/image 62). Mild to moderate left colonic stool burden. Vascular/Lymphatic: No evidence of abdominal aortic aneurysm. Atherosclerotic calcifications of the abdominal aorta and branch vessels. No suspicious abdominopelvic lymphadenopathy. Reproductive: Status post hysterectomy. Fiducial markers along the surgical bed. No adnexal masses. Other: No abdominopelvic ascites. Musculoskeletal: Status post PLIF at L4-5. Degenerative changes of the visualized thoracolumbar spine. Median sternotomy. IMPRESSION: Layering hyperdensity in the right posterolateral bladder. Differential considerations include hemorrhage/debris (favored) versus urothelial soft tissue lesion/mass. Consider cystoscopy for further evaluation. 12 mm calculus in the right proximal collecting system. Additional punctate nonobstructing bilateral renal calculi. Mild fullness of the left renal collecting system without frank hydronephrosis. Status post hysterectomy with fiducial markers along the surgical bed. No findings specific for recurrent or metastatic disease. Suspected mild constipation, which may account for the patient's left lower quadrant abdominal pain. Electronically Signed   By: Charline Bills M.D.   On: 01/07/2023 22:13    EKG: I independently viewed the EKG done and my findings are as followed: EKG was not done in the ED  Assessment/Plan Present on Admission:  Symptomatic anemia  Principal Problem:   Symptomatic anemia Active Problems:   Hematuria   Abdominal pain   Elevated MCV   Uncontrolled type 2 diabetes mellitus with hyperglycemia, without long-term current  use of insulin   Acquired hypothyroidism   GERD without esophagitis   Obesity (BMI 30-39.9)  Symptomatic anemia with hematuria This may be due to hemorrhage/debris's noted in CT abdomen and pelvis without contrast H/H= 6.6/22 (this was 10.9/33.2 on 11/27/2022) Type and crossmatch was done 1 unit of PRBC was ordered to be transfused in the ED Urologist on-call was consulted and will follow-up on patient in the morning  Abdominal pain in the setting of above Continue IV morphine 2 mg every 4 hours as needed  Elevated MCV MCV 102.3, vitamin B12, folate and iron studies will be done  Uncontrolled type 2 diabetes mellitus with hyperglycemia CBG 294, hemoglobin A1c done on 11/27/2022= 9.4 (Care Everywhere) Continue ISS and hypoglycemia protocol  Acquired hypothyroidism Continue Synthroid  GERD Continue Protonix  Obesity (BMI 32.45) Continue diet and lifestyle modification  DVT prophylaxis: SCDs  Code Status: Full code  Consults: Urology (by ED physician)  Family Communication: None at bedside  Severity of Illness: The appropriate patient status for this patient is OBSERVATION. Observation status is judged to be reasonable and necessary in order to provide the required intensity of service to ensure the patient's safety. The patient's presenting symptoms, physical exam findings, and initial  radiographic and laboratory data in the context of their medical condition is felt to place them at decreased risk for further clinical deterioration. Furthermore, it is anticipated that the patient will be medically stable for discharge from the hospital within 2 midnights of admission.   Author: Frankey Shown, DO 01/08/2023 3:06 AM  For on call review [email or web address].

## 2023-01-07 NOTE — ED Notes (Signed)
Hgb 6.6, Yao MD aware

## 2023-01-07 NOTE — ED Notes (Signed)
Bladder scan shows 

## 2023-01-08 ENCOUNTER — Inpatient Hospital Stay (HOSPITAL_COMMUNITY): Payer: Medicare Other

## 2023-01-08 DIAGNOSIS — Z79890 Hormone replacement therapy (postmenopausal): Secondary | ICD-10-CM | POA: Diagnosis not present

## 2023-01-08 DIAGNOSIS — F17210 Nicotine dependence, cigarettes, uncomplicated: Secondary | ICD-10-CM | POA: Diagnosis present

## 2023-01-08 DIAGNOSIS — D649 Anemia, unspecified: Secondary | ICD-10-CM | POA: Diagnosis not present

## 2023-01-08 DIAGNOSIS — N202 Calculus of kidney with calculus of ureter: Secondary | ICD-10-CM | POA: Diagnosis present

## 2023-01-08 DIAGNOSIS — E538 Deficiency of other specified B group vitamins: Secondary | ICD-10-CM | POA: Diagnosis present

## 2023-01-08 DIAGNOSIS — D7589 Other specified diseases of blood and blood-forming organs: Secondary | ICD-10-CM | POA: Diagnosis present

## 2023-01-08 DIAGNOSIS — K219 Gastro-esophageal reflux disease without esophagitis: Secondary | ICD-10-CM | POA: Diagnosis present

## 2023-01-08 DIAGNOSIS — Y842 Radiological procedure and radiotherapy as the cause of abnormal reaction of the patient, or of later complication, without mention of misadventure at the time of the procedure: Secondary | ICD-10-CM | POA: Diagnosis present

## 2023-01-08 DIAGNOSIS — E669 Obesity, unspecified: Secondary | ICD-10-CM | POA: Diagnosis present

## 2023-01-08 DIAGNOSIS — R718 Other abnormality of red blood cells: Secondary | ICD-10-CM | POA: Insufficient documentation

## 2023-01-08 DIAGNOSIS — R109 Unspecified abdominal pain: Secondary | ICD-10-CM | POA: Insufficient documentation

## 2023-01-08 DIAGNOSIS — I2510 Atherosclerotic heart disease of native coronary artery without angina pectoris: Secondary | ICD-10-CM | POA: Diagnosis present

## 2023-01-08 DIAGNOSIS — Z794 Long term (current) use of insulin: Secondary | ICD-10-CM | POA: Diagnosis not present

## 2023-01-08 DIAGNOSIS — E876 Hypokalemia: Secondary | ICD-10-CM | POA: Diagnosis present

## 2023-01-08 DIAGNOSIS — Z8249 Family history of ischemic heart disease and other diseases of the circulatory system: Secondary | ICD-10-CM | POA: Diagnosis not present

## 2023-01-08 DIAGNOSIS — E860 Dehydration: Secondary | ICD-10-CM | POA: Diagnosis present

## 2023-01-08 DIAGNOSIS — E1165 Type 2 diabetes mellitus with hyperglycemia: Secondary | ICD-10-CM | POA: Diagnosis present

## 2023-01-08 DIAGNOSIS — D62 Acute posthemorrhagic anemia: Secondary | ICD-10-CM | POA: Diagnosis present

## 2023-01-08 DIAGNOSIS — E1151 Type 2 diabetes mellitus with diabetic peripheral angiopathy without gangrene: Secondary | ICD-10-CM | POA: Diagnosis not present

## 2023-01-08 DIAGNOSIS — M797 Fibromyalgia: Secondary | ICD-10-CM | POA: Diagnosis present

## 2023-01-08 DIAGNOSIS — I714 Abdominal aortic aneurysm, without rupture, unspecified: Secondary | ICD-10-CM | POA: Diagnosis present

## 2023-01-08 DIAGNOSIS — R319 Hematuria, unspecified: Secondary | ICD-10-CM | POA: Insufficient documentation

## 2023-01-08 DIAGNOSIS — E785 Hyperlipidemia, unspecified: Secondary | ICD-10-CM | POA: Diagnosis present

## 2023-01-08 DIAGNOSIS — R310 Gross hematuria: Secondary | ICD-10-CM | POA: Diagnosis present

## 2023-01-08 DIAGNOSIS — E039 Hypothyroidism, unspecified: Secondary | ICD-10-CM | POA: Insufficient documentation

## 2023-01-08 DIAGNOSIS — Z7984 Long term (current) use of oral hypoglycemic drugs: Secondary | ICD-10-CM | POA: Diagnosis not present

## 2023-01-08 DIAGNOSIS — N3041 Irradiation cystitis with hematuria: Secondary | ICD-10-CM | POA: Diagnosis present

## 2023-01-08 DIAGNOSIS — C679 Malignant neoplasm of bladder, unspecified: Secondary | ICD-10-CM | POA: Diagnosis present

## 2023-01-08 DIAGNOSIS — I10 Essential (primary) hypertension: Secondary | ICD-10-CM | POA: Diagnosis present

## 2023-01-08 DIAGNOSIS — D6959 Other secondary thrombocytopenia: Secondary | ICD-10-CM | POA: Diagnosis present

## 2023-01-08 LAB — HEMOGLOBIN A1C
Hgb A1c MFr Bld: 7.5 % — ABNORMAL HIGH (ref 4.8–5.6)
Mean Plasma Glucose: 168.55 mg/dL

## 2023-01-08 LAB — COMPREHENSIVE METABOLIC PANEL
ALT: 23 U/L (ref 0–44)
ALT: 22 U/L (ref 0–44)
AST: 29 U/L (ref 15–41)
AST: 25 U/L (ref 15–41)
Albumin: 3.7 g/dL (ref 3.5–5.0)
Albumin: 3.3 g/dL — ABNORMAL LOW (ref 3.5–5.0)
Alkaline Phosphatase: 68 U/L (ref 38–126)
Alkaline Phosphatase: 61 U/L (ref 38–126)
Anion gap: 7 (ref 5–15)
Anion gap: 9 (ref 5–15)
BUN: 25 mg/dL — ABNORMAL HIGH (ref 8–23)
BUN: 27 mg/dL — ABNORMAL HIGH (ref 8–23)
CO2: 25 mmol/L (ref 22–32)
CO2: 24 mmol/L (ref 22–32)
Calcium: 8.5 mg/dL — ABNORMAL LOW (ref 8.9–10.3)
Calcium: 8.4 mg/dL — ABNORMAL LOW (ref 8.9–10.3)
Chloride: 103 mmol/L (ref 98–111)
Chloride: 103 mmol/L (ref 98–111)
Creatinine, Ser: 1.1 mg/dL — ABNORMAL HIGH (ref 0.44–1.00)
Creatinine, Ser: 1.07 mg/dL — ABNORMAL HIGH (ref 0.44–1.00)
GFR, Estimated: 55 mL/min — ABNORMAL LOW (ref >60)
GFR, Estimated: 57 mL/min — ABNORMAL LOW (ref >60)
Glucose, Bld: 177 mg/dL — ABNORMAL HIGH (ref 70–99)
Glucose, Bld: 231 mg/dL — ABNORMAL HIGH (ref 70–99)
Potassium: 3.3 mmol/L — ABNORMAL LOW (ref 3.5–5.1)
Potassium: 3.9 mmol/L (ref 3.5–5.1)
Sodium: 135 mmol/L (ref 135–145)
Sodium: 136 mmol/L (ref 135–145)
Total Bilirubin: 0.4 mg/dL (ref 0.3–1.2)
Total Bilirubin: 0.5 mg/dL (ref 0.3–1.2)
Total Protein: 6.8 g/dL (ref 6.5–8.1)
Total Protein: 6.4 g/dL — ABNORMAL LOW (ref 6.5–8.1)

## 2023-01-08 LAB — CBC
HCT: 23.5 % — ABNORMAL LOW (ref 36.0–46.0)
HCT: 22.4 % — ABNORMAL LOW (ref 36.0–46.0)
Hemoglobin: 7.3 g/dL — ABNORMAL LOW (ref 12.0–15.0)
Hemoglobin: 6.9 g/dL — CL (ref 12.0–15.0)
MCH: 30.7 pg (ref 26.0–34.0)
MCH: 30.9 pg (ref 26.0–34.0)
MCHC: 31.1 g/dL (ref 30.0–36.0)
MCHC: 30.8 g/dL (ref 30.0–36.0)
MCV: 98.7 fL (ref 80.0–100.0)
MCV: 100.4 fL — ABNORMAL HIGH (ref 80.0–100.0)
Platelets: 154 10*3/uL (ref 150–400)
Platelets: 136 10*3/uL — ABNORMAL LOW (ref 150–400)
RBC: 2.38 MIL/uL — ABNORMAL LOW (ref 3.87–5.11)
RBC: 2.23 MIL/uL — ABNORMAL LOW (ref 3.87–5.11)
RDW: 17.6 % — ABNORMAL HIGH (ref 11.5–15.5)
RDW: 17.3 % — ABNORMAL HIGH (ref 11.5–15.5)
WBC: 4.8 10*3/uL (ref 4.0–10.5)
WBC: 4.8 10*3/uL (ref 4.0–10.5)
nRBC: 0 % (ref 0.0–0.2)
nRBC: 0 % (ref 0.0–0.2)

## 2023-01-08 LAB — GLUCOSE, CAPILLARY
Glucose-Capillary: 202 mg/dL — ABNORMAL HIGH (ref 70–99)
Glucose-Capillary: 187 mg/dL — ABNORMAL HIGH (ref 70–99)
Glucose-Capillary: 146 mg/dL — ABNORMAL HIGH (ref 70–99)
Glucose-Capillary: 237 mg/dL — ABNORMAL HIGH (ref 70–99)

## 2023-01-08 LAB — FOLATE: Folate: 8.3 ng/mL (ref >5.9)

## 2023-01-08 LAB — IRON AND TIBC
Iron: 22 ug/dL — ABNORMAL LOW (ref 28–170)
Saturation Ratios: 5 % — ABNORMAL LOW (ref 10.4–31.8)
TIBC: 451 ug/dL — ABNORMAL HIGH (ref 250–450)
UIBC: 429 ug/dL

## 2023-01-08 LAB — PREPARE RBC (CROSSMATCH)

## 2023-01-08 LAB — MAGNESIUM: Magnesium: 1.9 mg/dL (ref 1.7–2.4)

## 2023-01-08 LAB — PHOSPHORUS: Phosphorus: 4.9 mg/dL — ABNORMAL HIGH (ref 2.5–4.6)

## 2023-01-08 LAB — HIV ANTIBODY (ROUTINE TESTING W REFLEX): HIV Screen 4th Generation wRfx: NONREACTIVE

## 2023-01-08 LAB — FERRITIN: Ferritin: 29 ng/mL (ref 11–307)

## 2023-01-08 LAB — VITAMIN B12: Vitamin B-12: 145 pg/mL — ABNORMAL LOW (ref 180–914)

## 2023-01-08 MED ORDER — INSULIN ASPART 100 UNIT/ML IJ SOLN
0.0000 [IU] | INTRAMUSCULAR | Status: DC
  Administered 2023-01-08: 3 [IU] via SUBCUTANEOUS

## 2023-01-08 MED ORDER — INSULIN ASPART 100 UNIT/ML IJ SOLN
0.0000 [IU] | Freq: Three times a day (TID) | INTRAMUSCULAR | Status: DC
  Administered 2023-01-08: 2 [IU] via SUBCUTANEOUS
  Administered 2023-01-08: 5 [IU] via SUBCUTANEOUS
  Administered 2023-01-09: 3 [IU] via SUBCUTANEOUS
  Administered 2023-01-09 – 2023-01-10 (×3): 5 [IU] via SUBCUTANEOUS
  Administered 2023-01-10 – 2023-01-11 (×3): 3 [IU] via SUBCUTANEOUS
  Administered 2023-01-11: 2 [IU] via SUBCUTANEOUS
  Administered 2023-01-11 – 2023-01-12 (×4): 3 [IU] via SUBCUTANEOUS
  Administered 2023-01-13: 2 [IU] via SUBCUTANEOUS
  Administered 2023-01-13 – 2023-01-14 (×3): 3 [IU] via SUBCUTANEOUS
  Administered 2023-01-15: 2 [IU] via SUBCUTANEOUS
  Administered 2023-01-15: 3 [IU] via SUBCUTANEOUS
  Administered 2023-01-15: 2 [IU] via SUBCUTANEOUS
  Administered 2023-01-16: 3 [IU] via SUBCUTANEOUS
  Administered 2023-01-16 (×2): 2 [IU] via SUBCUTANEOUS

## 2023-01-08 MED ORDER — ACETAMINOPHEN 650 MG RE SUPP: 650.0000 mg | Freq: Four times a day (QID) | RECTAL | Status: DC | PRN

## 2023-01-08 MED ORDER — LEVOTHYROXINE SODIUM 25 MCG PO TABS
125.0000 ug | ORAL_TABLET | Freq: Every day | ORAL | Status: DC
  Administered 2023-01-08 – 2023-01-16 (×9): 125 ug via ORAL
  Filled 2023-01-08 (×9): qty 1

## 2023-01-08 MED ORDER — MAGNESIUM SULFATE 2 GM/50ML IV SOLN
2.0000 g | Freq: Once | INTRAVENOUS | Status: AC
  Administered 2023-01-08: 2 g via INTRAVENOUS
  Filled 2023-01-08: qty 50

## 2023-01-08 MED ORDER — SODIUM CHLORIDE 0.9% IV SOLUTION: Freq: Once | INTRAVENOUS | Status: AC

## 2023-01-08 MED ORDER — CYANOCOBALAMIN 1000 MCG/ML IJ SOLN
1000.0000 ug | Freq: Once | INTRAMUSCULAR | Status: AC
  Administered 2023-01-09: 1000 ug via SUBCUTANEOUS
  Filled 2023-01-08: qty 1

## 2023-01-08 MED ORDER — SODIUM CHLORIDE 0.9 % IV SOLN
250.0000 mg | Freq: Every day | INTRAVENOUS | Status: AC
  Administered 2023-01-09 (×2): 250 mg via INTRAVENOUS
  Filled 2023-01-08 (×2): qty 20

## 2023-01-08 MED ORDER — CHLORHEXIDINE GLUCONATE CLOTH 2 % EX PADS
6.0000 | MEDICATED_PAD | Freq: Every day | CUTANEOUS | Status: DC
  Administered 2023-01-08 – 2023-01-16 (×9): 6 via TOPICAL

## 2023-01-08 MED ORDER — SODIUM CHLORIDE 0.9 % IV SOLN: INTRAVENOUS | Status: DC

## 2023-01-08 MED ORDER — SODIUM CHLORIDE 0.9% FLUSH
10.0000 mL | Freq: Two times a day (BID) | INTRAVENOUS | Status: DC
  Administered 2023-01-08 – 2023-01-16 (×13): 10 mL

## 2023-01-08 MED ORDER — PANTOPRAZOLE SODIUM 40 MG PO TBEC
80.0000 mg | DELAYED_RELEASE_TABLET | Freq: Every day | ORAL | Status: DC
  Administered 2023-01-08: 80 mg via ORAL
  Filled 2023-01-08: qty 2

## 2023-01-08 MED ORDER — PREGABALIN 100 MG PO CAPS
200.0000 mg | ORAL_CAPSULE | Freq: Three times a day (TID) | ORAL | Status: DC
  Administered 2023-01-08 – 2023-01-16 (×25): 200 mg via ORAL
  Filled 2023-01-08 (×25): qty 2

## 2023-01-08 MED ORDER — SODIUM CHLORIDE 0.9 % IV SOLN
1.0000 g | INTRAVENOUS | Status: DC
  Administered 2023-01-08 – 2023-01-11 (×4): 1 g via INTRAVENOUS
  Filled 2023-01-08 (×4): qty 10

## 2023-01-08 MED ORDER — OXYCODONE HCL 5 MG PO TABS
15.0000 mg | ORAL_TABLET | Freq: Four times a day (QID) | ORAL | Status: DC | PRN
  Administered 2023-01-09 – 2023-01-16 (×19): 15 mg via ORAL
  Filled 2023-01-08 (×19): qty 3

## 2023-01-08 MED ORDER — SODIUM CHLORIDE 0.9 % IR SOLN
3000.0000 mL | Status: DC
  Administered 2023-01-08 – 2023-01-14 (×47): 3000 mL

## 2023-01-08 MED ORDER — POTASSIUM CHLORIDE CRYS ER 20 MEQ PO TBCR
40.0000 meq | EXTENDED_RELEASE_TABLET | ORAL | Status: AC
  Administered 2023-01-08 (×2): 40 meq via ORAL
  Filled 2023-01-08 (×2): qty 2

## 2023-01-08 MED ORDER — MORPHINE SULFATE (PF) 2 MG/ML IV SOLN
2.0000 mg | INTRAVENOUS | Status: DC | PRN
  Administered 2023-01-09 – 2023-01-14 (×10): 2 mg via INTRAVENOUS
  Filled 2023-01-08 (×10): qty 1

## 2023-01-08 MED ORDER — ONDANSETRON HCL 4 MG PO TABS: 4.0000 mg | ORAL_TABLET | Freq: Four times a day (QID) | ORAL | Status: DC | PRN

## 2023-01-08 MED ORDER — MORPHINE SULFATE (PF) 2 MG/ML IV SOLN
2.0000 mg | INTRAVENOUS | Status: DC | PRN
  Administered 2023-01-08: 2 mg via INTRAVENOUS
  Filled 2023-01-08: qty 1

## 2023-01-08 MED ORDER — PANTOPRAZOLE SODIUM 40 MG PO TBEC
40.0000 mg | DELAYED_RELEASE_TABLET | Freq: Every day | ORAL | Status: DC
  Administered 2023-01-09 – 2023-01-16 (×8): 40 mg via ORAL
  Filled 2023-01-08 (×8): qty 1

## 2023-01-08 MED ORDER — SODIUM CHLORIDE 0.9% FLUSH: 10.0000 mL | INTRAVENOUS | Status: DC | PRN

## 2023-01-08 MED ORDER — ONDANSETRON HCL 4 MG/2ML IJ SOLN: 4.0000 mg | Freq: Four times a day (QID) | INTRAMUSCULAR | Status: DC | PRN

## 2023-01-08 MED ORDER — ACETAMINOPHEN 325 MG PO TABS
650.0000 mg | ORAL_TABLET | Freq: Four times a day (QID) | ORAL | Status: DC | PRN
  Administered 2023-01-09: 650 mg via ORAL
  Filled 2023-01-08: qty 2

## 2023-01-08 NOTE — Progress Notes (Addendum)
Pt passing large blood clots, ?? from vagina vs bladder, MD made aware. I/O cath for 600 cc's frank bloody urine, specimen sent for c/s (previous specimen was cc). MD made aware of above and orders received for CBI and tx to progressive. VSS.

## 2023-01-08 NOTE — ED Notes (Signed)
Blood consent signed

## 2023-01-08 NOTE — Consult Note (Signed)
Urology Consult  Referring physician: Dr. Thomes Dinning Reason for referral: gross hematuria  Chief Complaint: gross hematuria  History of Present Illness: [name] is a with a history of CAD who was admitted with gross hematuria and anemia. She has been having gross painless hematuria with clots for over 6 weeks. She has not hx of nephrolithiasis. She has an extensive tobacco abuse history. She denies any significant LUTS. NO fevers. No pelvic or abdominal pain. She denies flank pain. Hemoglobin on admission was 6.6. Ct on admission showed a right 12mm UPJ calculus and layering hyperdensity in the bladder consistent with clot.   Past Medical History:  Diagnosis Date   Carotid artery disease    Coronary artery disease    Depression    Family history of coronary artery disease    Fibromyalgia    Hyperlipidemia    Hypertension    Hypothyroidism    Subclavian artery stenosis, right    Tobacco abuse    Past Surgical History:  Procedure Laterality Date   BACK SURGERY  2010   CAROTID-SUBCLAVIAN BYPASS GRAFT Right 05/09/2014   Procedure: Right Carotid Subclavian bypass with 7 mm hemashield graft.;  Surgeon: Nada Libman, MD;  Location: [facility] OR;  Service: Vascular;  Laterality: Right;   CORONARY ANGIOPLASTY WITH STENT PLACEMENT  2004, 2008?   5 vessel    LEFT HEART CATHETERIZATION WITH CORONARY ANGIOGRAM N/A 04/08/2014   Procedure: LEFT HEART CATHETERIZATION WITH CORONARY ANGIOGRAM;  Surgeon: [name], MD;  Location: [facility] CATH LAB;  Service: Cardiovascular;  Laterality: N/A;   US ECHOCARDIOGRAPHY  12/30/2011   mild LVH, mild AOV sclerosis,trace MR,TR,trace/mild physiologic PI    Medications: I have reviewed the patient's current medications. Allergies: No Known Allergies  Family History  Problem Relation Age of Onset   Cancer Father        lung   Heart attack Father    Social History:  reports that she has been smoking cigarettes. She has a 2.00 pack-year smoking history. She has  never used smokeless tobacco. She reports that she does not drink alcohol and does not use drugs.  Review of Systems  Genitourinary:  Positive for hematuria.  All other systems reviewed and are negative.   Physical Exam:  Vital signs in last 24 hours: Temp:  [98.1 F (36.7 C)-99.8 F (37.7 C)] 98.7 F (37.1 C) (04/13 0952) Pulse Rate:  [81-95] 81 (04/13 0952) Resp:  [12-18] 12 (04/13 1112) BP: (95-146)/(51-71) 101/58 (04/13 1112) SpO2:  [84 %-98 %] 92 % (04/13 0557) Weight:  [88.5 kg] 88.5 kg (04/12 1844) Physical Exam Vitals reviewed.  Constitutional:      Appearance: She is well-developed.  HENT:     Head: Normocephalic and atraumatic.  Eyes:     Extraocular Movements: Extraocular movements intact.     Pupils: Pupils are equal, round, and reactive to light.  Cardiovascular:     Rate and Rhythm: Normal rate and regular rhythm.  Pulmonary:     Effort: Pulmonary effort is normal. No respiratory distress.  Abdominal:     General: Abdomen is flat. There is no distension.  Skin:    General: Skin is warm and dry.  Neurological:     General: No focal deficit present.     Mental Status: She is alert and oriented to person, place, and time.  Psychiatric:        Mood and Affect: Mood normal.        Behavior: Behavior normal.     Laboratory  Data:  Results for orders placed or performed during the hospital encounter of 01/07/23 (from the past 72 hour(s))  Urinalysis, Routine w reflex microscopic -Urine, Clean Catch     Status: Abnormal   Collection Time: 01/07/23  6:47 PM  Result Value Ref Range   Color, Urine BIOCHEMICALS MAY BE AFFECTED BY COLOR (A) YELLOW   APPearance TURBID (A) CLEAR   Specific Gravity, Urine BIOCHEMICALS MAY BE AFFECTED BY COLOR 1.005 - 1.030   pH BIOCHEMICALS MAY BE AFFECTED BY COLOR 5.0 - 8.0   Glucose, UA BIOCHEMICALS MAY BE AFFECTED BY COLOR (A) NEGATIVE mg/dL   Hgb urine dipstick BIOCHEMICALS MAY BE AFFECTED BY COLOR (A) NEGATIVE   Bilirubin  Urine BIOCHEMICALS MAY BE AFFECTED BY COLOR (A) NEGATIVE   Ketones, ur BIOCHEMICALS MAY BE AFFECTED BY COLOR (A) NEGATIVE mg/dL   Protein, ur BIOCHEMICALS MAY BE AFFECTED BY COLOR (A) NEGATIVE mg/dL   Nitrite BIOCHEMICALS MAY BE AFFECTED BY COLOR (A) NEGATIVE   Leukocytes,Ua BIOCHEMICALS MAY BE AFFECTED BY COLOR (A) NEGATIVE   RBC / HPF >50 0 - 5 RBC/hpf   WBC, UA 0-5 0 - 5 WBC/hpf   Bacteria, UA NONE SEEN NONE SEEN   Squamous Epithelial / HPF 0-5 0 - 5 /HPF    Comment: Performed at [facility], [address], [place], [place] [address]  CBC with Differential     Status: Abnormal   Collection Time: 01/07/23  9:25 PM  Result Value Ref Range   WBC 9.1 4.0 - 10.5 K/uL   RBC 2.15 (L) 3.87 - 5.11 MIL/uL   Hemoglobin 6.6 (LL) 12.0 - 15.0 g/dL    Comment: REPEATED TO VERIFY THIS CRITICAL RESULT HAS VERIFIED AND BEEN CALLED TO ABBY WOODY , RN BY MEGAN HAYES ON 04 12 2024 AT 2135, AND HAS BEEN READ BACK. CRITICAL RESULT VERIFIED    HCT 22.0 (L) 36.0 - 46.0 %   MCV 102.3 (H) 80.0 - 100.0 fL   MCH 30.7 26.0 - 34.0 pg   MCHC 30.0 30.0 - 36.0 g/dL   RDW 60.4 (H) 54.0 - 98.1 %   Platelets 173 150 - 400 K/uL   nRBC 0.3 (H) 0.0 - 0.2 %   Neutrophils Relative % 91 %   Neutro Abs 8.2 (H) 1.7 - 7.7 K/uL   Lymphocytes Relative 4 %   Lymphs Abs 0.4 (L) 0.7 - 4.0 K/uL   Monocytes Relative 4 %   Monocytes Absolute 0.4 0.1 - 1.0 K/uL   Eosinophils Relative 0 %   Eosinophils Absolute 0.0 0.0 - 0.5 K/uL   Basophils Relative 0 %   Basophils Absolute 0.0 0.0 - 0.1 K/uL   Immature Granulocytes 1 %   Abs Immature Granulocytes 0.07 0.00 - 0.07 K/uL    Comment: Performed at [facility], [address], [place], [place] [address]  Comprehensive metabolic panel     Status: Abnormal   Collection Time: 01/07/23  9:25 PM  Result Value Ref Range   Sodium 132 (L) 135 - 145 mmol/L   Potassium 3.6 3.5 - 5.1 mmol/L   Chloride 99 98 - 111 mmol/L   CO2 23 22 - 32 mmol/L    Glucose, Bld 294 (H) 70 - 99 mg/dL    Comment: Glucose reference range applies only to samples taken after fasting for at least 8 hours.   BUN 25 (H) 8 - 23 mg/dL   Creatinine, Ser 8.29 (H) 0.44 - 1.00 mg/dL   Calcium 9.2  8.9 - 10.3 mg/dL   Total Protein 7.5 6.5 - 8.1 g/dL   Albumin 4.0 3.5 - 5.0 g/dL   AST 38 15 - 41 U/L   ALT 28 0 - 44 U/L   Alkaline Phosphatase 77 38 - 126 U/L   Total Bilirubin 0.6 0.3 - 1.2 mg/dL   GFR, Estimated >78 >29 mL/min    Comment: (NOTE) Calculated using the CKD-EPI Creatinine Equation (2021)    Anion gap 10 5 - 15    Comment: Performed at [facility], [address], [place], [place] [address]  Lipase, blood     Status: None   Collection Time: 01/07/23  9:25 PM  Result Value Ref Range   Lipase 22 11 - 51 U/L    Comment: Performed at [facility], [address], [place], [place] [address]  Type and screen [facility]     Status: None (Preliminary result)   Collection Time: 01/07/23  9:51 PM  Result Value Ref Range   ABO/RH(D) A NEG    Antibody Screen NEG    Sample Expiration 01/10/2023,2359    Unit Number [id number]    Blood Component Type RED CELLS,LR    Unit division 00    Status of Unit ISSUED    Transfusion Status OK TO TRANSFUSE    Crossmatch Result      Compatible Performed at [facility], [address] [place], [place] [address]   Prepare RBC (crossmatch)     Status: None   Collection Time: 01/07/23  9:51 PM  Result Value Ref Range   Order Confirmation      ORDER PROCESSED BY BLOOD BANK Performed at [facility], [address] [place], [place] [address]   Glucose, capillary     Status: Abnormal   Collection Time: 01/08/23  6:18 AM  Result Value Ref Range   Glucose-Capillary 202 (H) 70 - 99 mg/dL    Comment: Glucose reference range applies only to samples taken after fasting for at least 8 hours.  Glucose, capillary     Status:  Abnormal   Collection Time: 01/08/23  7:32 AM  Result Value Ref Range   Glucose-Capillary 187 (H) 70 - 99 mg/dL    Comment: Glucose reference range applies only to samples taken after fasting for at least 8 hours.  Vitamin B12     Status: Abnormal   Collection Time: 01/08/23  9:12 AM  Result Value Ref Range   Vitamin B-12 145 (L) 180 - 914 pg/mL    Comment: (NOTE) This assay is not validated for testing neonatal or myeloproliferative syndrome specimens for Vitamin B12 levels. Performed at [facility], [address], [place], [place] [address]   Iron and TIBC     Status: Abnormal   Collection Time: 01/08/23  9:12 AM  Result Value Ref Range   Iron 22 (L) 28 - 170 ug/dL   TIBC 034 (H) 742 - 595 ug/dL   Saturation Ratios 5 (L) 10.4 - 31.8 %   UIBC 429 ug/dL    Comment: Performed at [facility], [address], [place], [place] [address]  Ferritin     Status: None   Collection Time: 01/08/23  9:12 AM  Result Value Ref Range   Ferritin 29 11 - 307 ng/mL    Comment: Performed at [facility], [address], [place], [place] [address]  Folate     Status: None  Collection Time: 01/08/23  9:12 AM  Result Value Ref Range   Folate 8.3 >5.9 ng/mL    Comment: Performed at [facility], [address], [place], [place] [address]  CBC     Status: Abnormal   Collection Time: 01/08/23  9:12 AM  Result Value Ref Range   WBC 4.8 4.0 - 10.5 K/uL   RBC 2.38 (L) 3.87 - 5.11 MIL/uL   Hemoglobin 7.3 (L) 12.0 - 15.0 g/dL   HCT 60.4 (L) 54.0 - 98.1 %   MCV 98.7 80.0 - 100.0 fL   MCH 30.7 26.0 - 34.0 pg   MCHC 31.1 30.0 - 36.0 g/dL   RDW 19.1 (H) 47.8 - 29.5 %   Platelets 154 150 - 400 K/uL   nRBC 0.0 0.0 - 0.2 %    Comment: Performed at [facility], [address] [place], [place] [address]  Comprehensive metabolic panel     Status: Abnormal   Collection Time: 01/08/23  9:12 AM  Result Value  Ref Range   Sodium 135 135 - 145 mmol/L   Potassium 3.3 (L) 3.5 - 5.1 mmol/L   Chloride 103 98 - 111 mmol/L   CO2 25 22 - 32 mmol/L   Glucose, Bld 177 (H) 70 - 99 mg/dL    Comment: Glucose reference range applies only to samples taken after fasting for at least 8 hours.   BUN 25 (H) 8 - 23 mg/dL   Creatinine, Ser 8.65 (H) 0.44 - 1.00 mg/dL   Calcium 8.5 (L) 8.9 - 10.3 mg/dL   Total Protein 6.8 6.5 - 8.1 g/dL   Albumin 3.7 3.5 - 5.0 g/dL   AST 29 15 - 41 U/L   ALT 23 0 - 44 U/L   Alkaline Phosphatase 68 38 - 126 U/L   Total Bilirubin 0.4 0.3 - 1.2 mg/dL   GFR, Estimated 55 (L) >60 mL/min    Comment: (NOTE) Calculated using the CKD-EPI Creatinine Equation (2021)    Anion gap 7 5 - 15    Comment: Performed at [facility], [address], [place], [place] [address]  Magnesium     Status: None   Collection Time: 01/08/23  9:12 AM  Result Value Ref Range   Magnesium 1.9 1.7 - 2.4 mg/dL    Comment: Performed at [facility], [address] [place], [place] [address]  Phosphorus     Status: Abnormal   Collection Time: 01/08/23  9:12 AM  Result Value Ref Range   Phosphorus 4.9 (H) 2.5 - 4.6 mg/dL    Comment: Performed at [facility], [address] [place], [place] [address]  Glucose, capillary     Status: Abnormal   Collection Time: 01/08/23 11:18 AM  Result Value Ref Range   Glucose-Capillary 146 (H) 70 - 99 mg/dL    Comment: Glucose reference range applies only to samples taken after fasting for at least 8 hours.   No results found for this or any previous visit (from the past 240 hour(s)). Creatinine: Recent Labs    01/07/23 2125 01/08/23 0912  CREATININE 1.02* 1.10*   Baseline Creatinine: 1  Impression/Assessment:  66yo with gross hematuria and right ureteral calculus  Plan:  Gross hematuria: We discussed the natural history of gross hematuria and the various causes of hematuria. Her hematuria is likely  related to the large right UPJ calculus. Her urine will be sent for culture and she will be started on antibiotics  if necessary. Urology to continue to follow  Wilkie Aye 01/08/2023, 1:12 PM

## 2023-01-08 NOTE — Progress Notes (Signed)
       Overnight   NAME: Diana Kennedy MRN: [id number] DOB : 09-29-1955    Date of Service   01/08/2023   HPI/Events of Note   Notified by RN for a portable CXR obtained prior to overnight shift.  The CXR read is, in part:  "FINDINGS: Lungs are clear.  No pleural effusion or pneumothorax.   The heart is normal in size. Postsurgical changes related to prior CABG.   Right chest port terminates cavoatrial junction.  Median sternotomy.   IMPRESSION: No acute cardiopulmonary disease.     Electronically Signed   By: Charline Bills M.D.   On: 01/08/2023 21:10 "   RN demonstrated blood draw and flush with minimal effort and no obvious or stated difficulty.    Interventions/ Plan   Use port as previously ordered Assess use as usual.      Chinita Greenland BSN MSNA MSN ACNPC-AG Acute Care Nurse Practitioner [facility]

## 2023-01-08 NOTE — Progress Notes (Signed)
Pt was pended to fourth floor progressive. Writing RN spoke with MD and ICU CN regarding pt. MD gave verbal order to place transfer orders for stepdown. Orders placed per V by this RN.

## 2023-01-08 NOTE — Progress Notes (Signed)
[facility] Progress Note  Patient: Diana Kennedy     JYN:[id number]  DOA: 01/07/2023   PCP: Coralee Rud, PA-C       Brief hospital course: This is a 67 year old female with endometrial cancer status post chemo and radiation, nephrolithiasis, diabetes mellitus type 2, hypothyroidism who presents to the hospital for left lower quadrant pain.  She admits to hematuria for about 1 month and has been following with urology in [facility].  Apparently, she had a 10 mm stone and is supposed to be getting a stent for this. In the ED she was found to have a hemoglobin of 6.6 and a hematocrit of 22-hemoglobin 10.9/hematocrit 33.2 on 11/27/2022 MCV 102.3 Sodium 132, potassium 3.6 BUN 25 and creatinine 1.02 CT scan of the abdomen pelvis without contrast: Layering hyperdensity in the right posterolateral bladder, 12 mm calculus in right proximal collecting system, additional punctate nonobstructing bilateral renal calculi, mild fullness of the over the left renal collecting system without frank hydronephrosis Status post hysterectomy with fiducial markers along the surgical bed no findings specific for recurrence or for metastatic disease She was ordered 1 unit of packed red blood cells  Subjective:  The patient states that she is been having blood only when she attempts to urinate.  I was called later in the day by the RN who stated that the patient was passing clots and pure red blood.  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 but urology has  recommended we start ceftriaxone after sending another culture - A urine culture was sent when the patient was in the ED as well   Active Problems:   -Hypokalemia - Continue to replace and follow -Magnesium 1.9-will give 2 g of magnesium now  Dehydration - Normal saline at 75 cc an hour-should be held while blood transfusion is being given -Follow continuous pulse ox to see if patient becomes hypoxic (fluid overload)  Elevated MCV - Vitamin B12 level is 145-will give 1000 mcg of vitamin B12 now subcu    Uncontrolled type 2 diabetes mellitus with hyperglycemia, without long-term current use of insulin -Sliding scale insulin ordered with meals    Acquired hypothyroidism -Continue Synthroid    GERD without esophagitis -Continue Protonix    Obesity (BMI 30-39.9) Body mass index is 32.45 kg/m.      Code Status: Full Code Consultants: Urology Level of Care: Level of care: Progressive Total time on patient care: 40 minutes DVT prophylaxis: SCDs  Objective:   Vitals:   01/08/23 0817 01/08/23 0952 01/08/23 1112 01/08/23 1401  BP:  (!) 95/51 (!) 101/58 (!) 118/41  Pulse:  81  84  Resp: Temp:  98.7 F (37.1 C)  98.1 F (36.7 C)  TempSrc:  Oral  Oral  SpO2:    94%  Weight:      Height:       Filed Weights   01/07/23 1844  Weight: 88.5 kg   Exam: General exam: Appears comfortable  HEENT: oral mucosa moist Respiratory system: Clear to auscultation.  Cardiovascular system:  S1 & S2 heard  Gastrointestinal system: Abdomen soft, non-tender, nondistended. Normal bowel sounds   Extremities: No cyanosis, clubbing or edema Psychiatry:  Mood & affect appropriate.      CBC: Recent Labs  Lab 01/07/23 2125 01/08/23 0912  WBC 9.1 4.8  NEUTROABS 8.2*  --   HGB 6.6* 7.3*  HCT 22.0* 23.5*  MCV 102.3* 98.7  PLT 173 154   Basic Metabolic Panel: Recent Labs  Lab 01/07/23 2125 01/08/23 0912  NA 132* 135  K 3.6 3.3*  CL 99 103  CO2 23 25  GLUCOSE 294*  177*  BUN 25* 25*  CREATININE 1.02* 1.10*  CALCIUM 9.2 8.5*  MG  --  1.9  PHOS  --  4.9*   GFR: Estimated Creatinine Clearance: 55.3 mL/min (A) (by C-G formula based on SCr of 1.1 mg/dL (H)).  Scheduled Meds:  sodium chloride   Intravenous Once   insulin aspart  0-15 Units Subcutaneous TID WC   levothyroxine  125 mcg Oral QAC breakfast   [START ON 01/09/2023] pantoprazole  40 mg Oral Daily   potassium chloride  40 mEq Oral Q4H   pregabalin  200 mg Oral TID   Continuous Infusions:  sodium chloride 75 mL/hr at 01/08/23 1106   cefTRIAXone (ROCEPHIN)  IV     ferric gluconate (FERRLECIT) IVPB     sodium chloride irrigation     Imaging and lab data was personally reviewed CT Renal Stone Study  Result Date: 01/07/2023 CLINICAL DATA:  Left lower quadrant abdominal/flank pain, history of metastatic/recurrent endometrioid adenocarcinoma EXAM: CT ABDOMEN AND PELVIS WITHOUT CONTRAST TECHNIQUE: Multidetector CT imaging of the abdomen and pelvis was performed following the standard protocol without IV contrast. RADIATION DOSE REDUCTION: This exam was performed according to the departmental dose-optimization program which includes automated exposure control, adjustment of the mA and/or kV according to patient size and/or use of iterative reconstruction technique. COMPARISON:  PET-CT dated 12/03/2022 FINDINGS: Lower chest: Mild scarring/atelectasis at the lung bases. Hepatobiliary: Mild hepatic steatosis focal fatty sparing along the gallbladder fossa. Layering gallbladder sludge (series 2/image 41), without associated inflammatory changes. No intrahepatic or extrahepatic duct dilatation. Pancreas: Within normal limits. Spleen: Within normal limits Adrenals/Urinary Tract: Adrenal glands are within normal limits. Punctate nonobstructing bilateral renal calculi. Additional 12 mm calculus in the right proximal collecting system (coronal image 87). Mild fullness of the left renal collecting system without  frank hydronephrosis. Layering hyperdensity in the right posterolateral bladder (series 2/image 82). Differential considerations include hemorrhage/debris (favored) versus urothelial soft tissue lesion/mass. Stomach/Bowel: Stomach is within normal limits. No evidence of bowel obstruction. Normal appendix (series 2/image 62). Mild to moderate left colonic stool burden. Vascular/Lymphatic: No evidence of abdominal aortic aneurysm. Atherosclerotic calcifications of the abdominal aorta and branch vessels. No suspicious abdominopelvic lymphadenopathy. Reproductive: Status post hysterectomy. Fiducial markers along the surgical bed. No adnexal masses. Other: No abdominopelvic ascites. Musculoskeletal: Status post PLIF at L4-5. Degenerative changes of the visualized thoracolumbar spine. Median sternotomy. IMPRESSION: Layering hyperdensity in the right posterolateral bladder. Differential considerations include hemorrhage/debris (favored) versus urothelial soft tissue lesion/mass. Consider cystoscopy for further evaluation. 12 mm calculus in the right proximal collecting system. Additional punctate nonobstructing bilateral renal calculi. Mild fullness of the left renal collecting system without frank hydronephrosis. Status post hysterectomy with fiducial markers along the surgical bed. No findings specific for recurrent or metastatic disease. Suspected mild constipation, which may account for the patient's left lower quadrant abdominal pain. Electronically Signed   By: Roselie Awkward.D.  On: 01/07/2023 22:13    LOS: 0 days   Author: Calvert Cantor  01/08/2023 5:18 PM  To contact [facility]>   Check the care team in [facility] and look for the attending/consulting [facility] provider listed  Log into [email or web address] and use [place]'s universal password   Go to> "[facility]"  and find provider  If you still have difficulty reaching the provider, please page the [facility] (Director on Call) for the Hospitalists  listed on amion

## 2023-01-09 DIAGNOSIS — D649 Anemia, unspecified: Secondary | ICD-10-CM | POA: Diagnosis not present

## 2023-01-09 LAB — CBC
HCT: 26.1 % — ABNORMAL LOW (ref 36.0–46.0)
HCT: 25.8 % — ABNORMAL LOW (ref 36.0–46.0)
HCT: 26.2 % — ABNORMAL LOW (ref 36.0–46.0)
Hemoglobin: 8.2 g/dL — ABNORMAL LOW (ref 12.0–15.0)
Hemoglobin: 8 g/dL — ABNORMAL LOW (ref 12.0–15.0)
Hemoglobin: 8.2 g/dL — ABNORMAL LOW (ref 12.0–15.0)
MCH: 30 pg (ref 26.0–34.0)
MCH: 30 pg (ref 26.0–34.0)
MCH: 30.5 pg (ref 26.0–34.0)
MCHC: 31.4 g/dL (ref 30.0–36.0)
MCHC: 31 g/dL (ref 30.0–36.0)
MCHC: 31.3 g/dL (ref 30.0–36.0)
MCV: 95.6 fL (ref 80.0–100.0)
MCV: 96.6 fL (ref 80.0–100.0)
MCV: 97.4 fL (ref 80.0–100.0)
Platelets: 120 10*3/uL — ABNORMAL LOW (ref 150–400)
Platelets: 127 10*3/uL — ABNORMAL LOW (ref 150–400)
Platelets: 130 10*3/uL — ABNORMAL LOW (ref 150–400)
RBC: 2.73 MIL/uL — ABNORMAL LOW (ref 3.87–5.11)
RBC: 2.67 MIL/uL — ABNORMAL LOW (ref 3.87–5.11)
RBC: 2.69 MIL/uL — ABNORMAL LOW (ref 3.87–5.11)
RDW: 17.2 % — ABNORMAL HIGH (ref 11.5–15.5)
RDW: 17.6 % — ABNORMAL HIGH (ref 11.5–15.5)
RDW: 17.5 % — ABNORMAL HIGH (ref 11.5–15.5)
WBC: 4.6 10*3/uL (ref 4.0–10.5)
WBC: 5.1 10*3/uL (ref 4.0–10.5)
WBC: 6.2 10*3/uL (ref 4.0–10.5)
nRBC: 0 % (ref 0.0–0.2)
nRBC: 0.4 % — ABNORMAL HIGH (ref 0.0–0.2)
nRBC: 0.6 % — ABNORMAL HIGH (ref 0.0–0.2)

## 2023-01-09 LAB — GLUCOSE, CAPILLARY
Glucose-Capillary: 206 mg/dL — ABNORMAL HIGH (ref 70–99)
Glucose-Capillary: 209 mg/dL — ABNORMAL HIGH (ref 70–99)
Glucose-Capillary: 174 mg/dL — ABNORMAL HIGH (ref 70–99)

## 2023-01-09 LAB — BASIC METABOLIC PANEL
Anion gap: 8 (ref 5–15)
BUN: 21 mg/dL (ref 8–23)
CO2: 26 mmol/L (ref 22–32)
Calcium: 8.1 mg/dL — ABNORMAL LOW (ref 8.9–10.3)
Chloride: 105 mmol/L (ref 98–111)
Creatinine, Ser: 0.9 mg/dL (ref 0.44–1.00)
GFR, Estimated: >60 mL/min (ref >60)
Glucose, Bld: 202 mg/dL — ABNORMAL HIGH (ref 70–99)
Potassium: 4 mmol/L (ref 3.5–5.1)
Sodium: 139 mmol/L (ref 135–145)

## 2023-01-09 LAB — MRSA NEXT GEN BY PCR, NASAL: MRSA by PCR Next Gen: NOT DETECTED

## 2023-01-09 MED ORDER — INSULIN GLARGINE-YFGN 100 UNIT/ML ~~LOC~~ SOLN
6.0000 [IU] | Freq: Every day | SUBCUTANEOUS | Status: DC
  Administered 2023-01-09 – 2023-01-15 (×7): 6 [IU] via SUBCUTANEOUS
  Filled 2023-01-09 (×8): qty 0.06

## 2023-01-09 MED ORDER — ORAL CARE MOUTH RINSE: 15.0000 mL | OROMUCOSAL | Status: DC | PRN

## 2023-01-09 NOTE — Progress Notes (Signed)
[facility] Progress Note  Patient: Diana Kennedy     LGX:[id number]  DOA: 01/07/2023   PCP: Coralee Rud, PA-C       Brief hospital course: This is a 67 year old female with endometrial cancer status post chemo and radiation, nephrolithiasis, diabetes mellitus type 2, hypothyroidism who presents to the hospital for left lower quadrant pain.  She admits to hematuria for about 1 month and has been following with urology in [facility].  Apparently, she had a 10 mm stone and is supposed to be getting a stent for this. In the ED she was found to have a hemoglobin of 6.6 and a hematocrit of 22-hemoglobin 10.9/hematocrit 33.2 on 11/27/2022 MCV 102.3 Sodium 132, potassium 3.6 BUN 25 and creatinine 1.02 CT scan of the abdomen pelvis without contrast: Layering hyperdensity in the right posterolateral bladder, 12 mm calculus in right proximal collecting system, additional punctate nonobstructing bilateral renal calculi, mild fullness of the over the left renal collecting system without frank hydronephrosis Status post hysterectomy with fiducial markers along the surgical bed no findings specific for recurrence or for metastatic disease She was ordered 1 unit of packed red blood cells  Subjective:  She has no complaints today.   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 but urology has recommended we start ceftriaxone after sending another culture - A urine culture was sent when the patient was in the ED as well - no  results on culture - some clotting of CB I overnight -  cont to follow in SDU as BP still low   Active Problems:   -Hypokalemia -replaced  Dehydration - Normal saline at 75 cc an hour-  -Follow continuous pulse ox to see if patient becomes hypoxic (fluid overload)  Elevated MCV - Vitamin B12 level is 145-  1000 mcg of vitamin B12 subcu given on 4/13    Uncontrolled type 2 diabetes mellitus with hyperglycemia, without long-term current use of insulin -Sliding scale insulin ordered with meals - Semglee resumed    Acquired hypothyroidism -Continue Synthroid    GERD without esophagitis -Continue Protonix    Obesity (BMI 30-39.9) Body mass index is 32.45 kg/m.      Code Status: Full Code Consultants: Urology Level of Care: Level of care: Stepdown Total time on patient care: 30 minutes DVT prophylaxis: SCDs  Objective:   Vitals:   01/09/23 1100 01/09/23 1200 01/09/23 1300 01/09/23 1500  BP: (!) 91/50 (!) 111/59 125/77 (!) 98/44  Pulse: 71 71 79 75  Resp: 17 11 19 11   Temp:  98 F (36.7 C)    TempSrc:  Oral    SpO2: 97% 94% 100% 94%  Weight:      Height:       Filed Weights   01/07/23 1844  Weight: 88.5 kg   Exam: General exam: Appears comfortable  HEENT: oral mucosa moist Respiratory system: Clear to auscultation.  Cardiovascular system: S1 & S2 heard  Gastrointestinal system: Abdomen soft, non-tender, nondistended. Normal bowel sounds   Extremities: No cyanosis, clubbing  or edema Psychiatry:  Mood & affect appropriate.      CBC: Recent Labs  Lab 01/07/23 2125 01/08/23 0912 01/08/23 1756 01/09/23 0448 01/09/23 1242  WBC 9.1 4.8 4.8 4.6 5.1  NEUTROABS 8.2*  --   --   --   --   HGB 6.6* 7.3* 6.9* 8.2* 8.0*  HCT 22.0* 23.5* 22.4* 26.1* 25.8*  MCV 102.3* 98.7 100.4* 95.6 96.6  PLT 173 154 136* 120* 127*    Basic Metabolic Panel: Recent Labs  Lab 01/07/23 2125 01/08/23 0912 01/08/23 1756 01/09/23 0448  NA 132* 135 136 139  K 3.6 3.3* 3.9  4.0  CL 99 103 103 105  CO2 GLUCOSE 294* 177* 231* 202*  BUN 25* 25* 27* 21  CREATININE 1.02* 1.10* 1.07* 0.90  CALCIUM 9.2 8.5* 8.4* 8.1*  MG  --  1.9  --   --   PHOS  --  4.9*  --   --     GFR: Estimated Creatinine Clearance: 67.6 mL/min (by C-G formula based on SCr of 0.9 mg/dL).  Scheduled Meds:  Chlorhexidine Gluconate Cloth  6 each Topical Daily   insulin aspart  0-15 Units Subcutaneous TID WC   insulin glargine-yfgn  6 Units Subcutaneous QHS   levothyroxine  125 mcg Oral QAC breakfast   pantoprazole  40 mg Oral Daily   pregabalin  200 mg Oral TID   sodium chloride flush  10-40 mL Intracatheter Q12H   Continuous Infusions:  sodium chloride 75 mL/hr at 01/09/23 1347   cefTRIAXone (ROCEPHIN)  IV Stopped (01/08/23 2230)   sodium chloride irrigation     Imaging and lab data was personally reviewed DG CHEST PORT 1 VIEW  Result Date: 01/08/2023 CLINICAL DATA:  Cough EXAM: PORTABLE CHEST 1 VIEW COMPARISON:  09/25/2022 FINDINGS: Lungs are clear.  No pleural effusion or pneumothorax. The heart is normal in size. Postsurgical changes related to prior CABG. Right chest port terminates cavoatrial junction.  Median sternotomy. IMPRESSION: No acute cardiopulmonary disease. Electronically Signed   By: Charline Bills M.D.   On: 01/08/2023 21:10   CT Renal Stone Study  Result Date: 01/07/2023 CLINICAL DATA:  Left lower quadrant abdominal/flank pain, history of metastatic/recurrent endometrioid adenocarcinoma EXAM: CT ABDOMEN AND PELVIS WITHOUT CONTRAST TECHNIQUE: Multidetector CT imaging of the abdomen and pelvis was performed following the standard protocol without IV contrast. RADIATION DOSE REDUCTION: This exam was performed according to the departmental dose-optimization program which includes automated exposure control, adjustment of the mA and/or kV according to patient size and/or use of iterative reconstruction technique. COMPARISON:  PET-CT dated 12/03/2022 FINDINGS:  Lower chest: Mild scarring/atelectasis at the lung bases. Hepatobiliary: Mild hepatic steatosis focal fatty sparing along the gallbladder fossa. Layering gallbladder sludge (series 2/image 41), without associated inflammatory changes. No intrahepatic or extrahepatic duct dilatation. Pancreas: Within normal limits. Spleen: Within normal limits Adrenals/Urinary Tract: Adrenal glands are within normal limits. Punctate nonobstructing bilateral renal calculi. Additional 12 mm calculus in the right proximal collecting system (coronal image 87). Mild fullness of the left renal collecting system without frank hydronephrosis. Layering hyperdensity in the right posterolateral bladder (series 2/image 82). Differential considerations include hemorrhage/debris (favored) versus urothelial soft tissue lesion/mass. Stomach/Bowel: Stomach is within normal limits. No evidence of bowel obstruction. Normal appendix (series 2/image 62). Mild to moderate left colonic stool burden. Vascular/Lymphatic: No evidence of abdominal aortic aneurysm. Atherosclerotic calcifications of the abdominal aorta and branch vessels. No suspicious abdominopelvic lymphadenopathy. Reproductive: Status post hysterectomy. Fiducial markers along  the surgical bed. No adnexal masses. Other: No abdominopelvic ascites. Musculoskeletal: Status post PLIF at L4-5. Degenerative changes of the visualized thoracolumbar spine. Median sternotomy. IMPRESSION: Layering hyperdensity in the right posterolateral bladder. Differential considerations include hemorrhage/debris (favored) versus urothelial soft tissue lesion/mass. Consider cystoscopy for further evaluation. 12 mm calculus in the right proximal collecting system. Additional punctate nonobstructing bilateral renal calculi. Mild fullness of the left renal collecting system without frank hydronephrosis. Status post hysterectomy with fiducial markers along the surgical bed. No findings specific for recurrent or metastatic  disease. Suspected mild constipation, which may account for the patient's left lower quadrant abdominal pain. Electronically Signed   By: Charline Bills M.D.   On: 01/07/2023 22:13    LOS: 1 day   Author: Calvert Cantor  01/09/2023 3:52 PM  To contact [facility]>   Check the care team in [facility] and look for the attending/consulting [facility] provider listed  Log into [email or web address] and use [place]'s universal password   Go to> "[facility]"  and find provider  If you still have difficulty reaching the provider, please page the [facility] (Director on Call) for the Hospitalists listed on amion

## 2023-01-09 NOTE — Progress Notes (Signed)
  Transition of Care [facility]) Screening Note   Patient Details  Name: Diana Kennedy Date of Birth: June 22, 1956   [facility] of [facility]) CM/SW Contact:    Adrian Prows, RN Phone Number: 01/09/2023, 10:08 AM    Transition of Care Department [facility]) has reviewed patient and no TOC needs have been identified at this time. We will continue to monitor patient advancement through interdisciplinary progression rounds. If new patient transition needs arise, please place a TOC consult.

## 2023-01-09 NOTE — Progress Notes (Signed)
Subjective: Patient started on CBI overnight. Urine currently light pink on fast drip CBI. Urine culture pending  Objective: Vital signs in last 24 hours: Temp:  [97.6 F (36.4 C)-99 F (37.2 C)] 98 F (36.7 C) (04/14 1200) Pulse Rate:  [66-88] 79 (04/14 1300) Resp:  [10-19] 19 (04/14 1300) BP: (83-125)/(39-77) 125/77 (04/14 1300) SpO2:  [88 %-100 %] 100 % (04/14 1300)  Intake/Output from previous day: 04/13 0701 - 04/14 0700 In: 25596.6 [P.O.:300; I.V.:245; Blood:584; IV Piggyback:367.6] Out: 26834 [Urine:27100] Intake/Output this shift: Total I/O In: 12699.7 [I.V.:429.7; Other:12000; IV Piggyback:270] Out: 19622 [Urine:13550]  Physical Exam:  General:alert, cooperative, and appears stated age GI: soft, non tender, normal bowel sounds, no palpable masses, no organomegaly, no inguinal hernia Female genitalia: not done Extremities: extremities normal, atraumatic, no cyanosis or edema  Lab Results: Recent Labs    01/08/23 1756 01/09/23 0448 01/09/23 1242  HGB 6.9* 8.2* 8.0*  HCT 22.4* 26.1* 25.8*   BMET Recent Labs    01/08/23 1756 01/09/23 0448  NA 136 139  K 3.9 4.0  CL 103 105  CO2 24 26  GLUCOSE 231* 202*  BUN 27* 21  CREATININE 1.07* 0.90  CALCIUM 8.4* 8.1*   No results for input(s): "LABPT", "INR" in the last 72 hours. No results for input(s): "LABURIN" in the last 72 hours. Results for orders placed or performed during the hospital encounter of 01/07/23  MRSA Next Gen by PCR, Nasal     Status: None   Collection Time: 01/09/23  4:37 AM   Specimen: Nasal Mucosa; Nasal Swab  Result Value Ref Range Status   MRSA by PCR Next Gen NOT DETECTED NOT DETECTED Final    Comment: (NOTE) The GeneXpert MRSA Assay (FDA approved for NASAL specimens only), is one component of a comprehensive MRSA colonization surveillance program. It is not intended to diagnose MRSA infection nor to guide or monitor treatment for MRSA infections. Test performance is not FDA  approved in patients less than 56 years old. Performed at [facility], [address] [place], [place] [address]     Studies/Results: DG CHEST PORT 1 VIEW  Result Date: 01/08/2023 CLINICAL DATA:  Cough EXAM: PORTABLE CHEST 1 VIEW COMPARISON:  09/25/2022 FINDINGS: Lungs are clear.  No pleural effusion or pneumothorax. The heart is normal in size. Postsurgical changes related to prior CABG. Right chest port terminates cavoatrial junction.  Median sternotomy. IMPRESSION: No acute cardiopulmonary disease. Electronically Signed   By: Charline Bills M.D.   On: 01/08/2023 21:10   CT Renal Stone Study  Result Date: 01/07/2023 CLINICAL DATA:  Left lower quadrant abdominal/flank pain, history of metastatic/recurrent endometrioid adenocarcinoma EXAM: CT ABDOMEN AND PELVIS WITHOUT CONTRAST TECHNIQUE: Multidetector CT imaging of the abdomen and pelvis was performed following the standard protocol without IV contrast. RADIATION DOSE REDUCTION: This exam was performed according to the departmental dose-optimization program which includes automated exposure control, adjustment of the mA and/or kV according to patient size and/or use of iterative reconstruction technique. COMPARISON:  PET-CT dated 12/03/2022 FINDINGS: Lower chest: Mild scarring/atelectasis at the lung bases. Hepatobiliary: Mild hepatic steatosis focal fatty sparing along the gallbladder fossa. Layering gallbladder sludge (series 2/image 41), without associated inflammatory changes. No intrahepatic or extrahepatic duct dilatation. Pancreas: Within normal limits. Spleen: Within normal limits Adrenals/Urinary Tract: Adrenal glands are within normal limits. Punctate nonobstructing bilateral renal calculi. Additional 12 mm calculus in the right proximal collecting system (coronal image 87). Mild fullness of the left renal collecting system without frank hydronephrosis.  Layering hyperdensity in the right posterolateral bladder (series  2/image 82). Differential considerations include hemorrhage/debris (favored) versus urothelial soft tissue lesion/mass. Stomach/Bowel: Stomach is within normal limits. No evidence of bowel obstruction. Normal appendix (series 2/image 62). Mild to moderate left colonic stool burden. Vascular/Lymphatic: No evidence of abdominal aortic aneurysm. Atherosclerotic calcifications of the abdominal aorta and branch vessels. No suspicious abdominopelvic lymphadenopathy. Reproductive: Status post hysterectomy. Fiducial markers along the surgical bed. No adnexal masses. Other: No abdominopelvic ascites. Musculoskeletal: Status post PLIF at L4-5. Degenerative changes of the visualized thoracolumbar spine. Median sternotomy. IMPRESSION: Layering hyperdensity in the right posterolateral bladder. Differential considerations include hemorrhage/debris (favored) versus urothelial soft tissue lesion/mass. Consider cystoscopy for further evaluation. 12 mm calculus in the right proximal collecting system. Additional punctate nonobstructing bilateral renal calculi. Mild fullness of the left renal collecting system without frank hydronephrosis. Status post hysterectomy with fiducial markers along the surgical bed. No findings specific for recurrent or metastatic disease. Suspected mild constipation, which may account for the patient's left lower quadrant abdominal pain. Electronically Signed   By: Charline Bills M.D.   On: 01/07/2023 22:13    Assessment/Plan: 40JW with hematuria and nephrolithiasis Please continue to wean CBI. The patient should remain on broad spectrum antibiotics pending her urine culture. Urology to continue to follow   LOS: 1 day   Wilkie Aye 01/09/2023, 2:39 PM

## 2023-01-10 DIAGNOSIS — D649 Anemia, unspecified: Secondary | ICD-10-CM | POA: Diagnosis not present

## 2023-01-10 LAB — GLUCOSE, CAPILLARY
Glucose-Capillary: 179 mg/dL — ABNORMAL HIGH (ref 70–99)
Glucose-Capillary: 152 mg/dL — ABNORMAL HIGH (ref 70–99)
Glucose-Capillary: 223 mg/dL — ABNORMAL HIGH (ref 70–99)

## 2023-01-10 LAB — BASIC METABOLIC PANEL
Anion gap: 7 (ref 5–15)
BUN: 19 mg/dL (ref 8–23)
CO2: 25 mmol/L (ref 22–32)
Calcium: 7.7 mg/dL — ABNORMAL LOW (ref 8.9–10.3)
Chloride: 105 mmol/L (ref 98–111)
Creatinine, Ser: 0.79 mg/dL (ref 0.44–1.00)
GFR, Estimated: >60 mL/min (ref >60)
Glucose, Bld: 170 mg/dL — ABNORMAL HIGH (ref 70–99)
Potassium: 3.8 mmol/L (ref 3.5–5.1)
Sodium: 137 mmol/L (ref 135–145)

## 2023-01-10 LAB — CBC
HCT: 25.3 % — ABNORMAL LOW (ref 36.0–46.0)
Hemoglobin: 7.7 g/dL — ABNORMAL LOW (ref 12.0–15.0)
MCH: 29.8 pg (ref 26.0–34.0)
MCHC: 30.4 g/dL (ref 30.0–36.0)
MCV: 98.1 fL (ref 80.0–100.0)
Platelets: 120 10*3/uL — ABNORMAL LOW (ref 150–400)
RBC: 2.58 MIL/uL — ABNORMAL LOW (ref 3.87–5.11)
RDW: 17.5 % — ABNORMAL HIGH (ref 11.5–15.5)
WBC: 5 10*3/uL (ref 4.0–10.5)
nRBC: 0.6 % — ABNORMAL HIGH (ref 0.0–0.2)

## 2023-01-10 LAB — URINE CULTURE

## 2023-01-10 LAB — PREPARE RBC (CROSSMATCH)

## 2023-01-10 MED ORDER — SODIUM CHLORIDE 0.9% IV SOLUTION: Freq: Once | INTRAVENOUS | Status: AC

## 2023-01-10 NOTE — Progress Notes (Signed)
[facility] Progress Note  Patient: Diana Kennedy     ZOX:[id number]  DOA: 01/07/2023   PCP: Coralee Rud, PA-C       Brief hospital course: This is a 67 year old female with endometrial cancer status post chemo and radiation, nephrolithiasis, diabetes mellitus type 2, hypothyroidism who presents to the hospital for left lower quadrant pain.  She admits to hematuria for about 1 month and has been following with urology in [facility].  Apparently, she had a 10 mm stone and is supposed to be getting a stent for this. In the ED she was found to have a hemoglobin of 6.6 and a hematocrit of 22-hemoglobin 10.9/hematocrit 33.2 on 11/27/2022 MCV 102.3 Sodium 132, potassium 3.6 BUN 25 and creatinine 1.02 CT scan of the abdomen pelvis without contrast: Layering hyperdensity in the right posterolateral bladder, 12 mm calculus in right proximal collecting system, additional punctate nonobstructing bilateral renal calculi, mild fullness of the over the left renal collecting system without frank hydronephrosis Status post hysterectomy with fiducial markers along the surgical bed no findings specific for recurrence or for metastatic disease She was ordered 1 unit of packed red blood cells  Subjective:  No complaints. States she is eating/drinking well. No dyspnea or cough.   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 but urology has recommended we start ceftriaxone after sending another culture - A urine culture was sent when the  patient was in the ED as well - culture from 4/12> Multiple species - still having pink tinged urine -  cont to follow in SDU as BP still low - Hgb 7.7 today- transfuse 1 more U PRBC as hematuria not resolved yet   Active Problems:  Thrombocytopenia - due to acute blood loss- following   -Hypokalemia -replaced  Dehydration - Normal saline at 75 cc an hour-  -Follow continuous pulse ox to see if patient becomes hypoxic (fluid overload)  Elevated MCV - Vitamin B12 level is 145-  1000 mcg of vitamin B12 subcu given on 4/13    Uncontrolled type 2 diabetes mellitus with hyperglycemia, without long-term current use of insulin -Sliding scale insulin ordered with meals - Semglee resumed    Acquired hypothyroidism -Continue Synthroid    GERD without esophagitis -Continue Protonix    Obesity (BMI 30-39.9) Body mass index is 32.45 kg/m.      Code Status: Full Code Consultants: Urology Level of Care: Level of care: Stepdown Total time on patient care: 30 minutes DVT prophylaxis: SCDs  Objective:   Vitals:   01/10/23 1000 01/10/23 1200 01/10/23 1300 01/10/23 1400  BP: (!) 90/39 113/64 (!) 96/58 (!) 109/46  Pulse: 74 74 75 73  Resp: Temp:  98.2 F (36.8 C)    TempSrc:  Oral    SpO2: 95% 92% 93% 95%  Weight:      Height:       Filed Weights   01/07/23 1844  Weight: 88.5 kg   Exam: General exam: Appears comfortable  HEENT: oral mucosa  moist Respiratory system: Clear to auscultation.  Cardiovascular system: S1 & S2 heard  Gastrointestinal system: Abdomen soft, non-tender, nondistended. Normal bowel sounds   Extremities: No cyanosis, clubbing or edema Psychiatry:  Mood & affect appropriate.        CBC: Recent Labs  Lab 01/07/23 2125 01/08/23 0912 01/08/23 1756 01/09/23 0448 01/09/23 1242 01/09/23 2045 01/10/23 0439  WBC 9.1   < > 4.8 4.6 5.1 6.2 5.0  NEUTROABS 8.2*  --   --   --   --   --   --   HGB 6.6*   < > 6.9* 8.2* 8.0* 8.2* 7.7*   HCT 22.0*   < > 22.4* 26.1* 25.8* 26.2* 25.3*  MCV 102.3*   < > 100.4* 95.6 96.6 97.4 98.1  PLT 173   < > 136* 120* 127* 130* 120*   < > = values in this interval not displayed.    Basic Metabolic Panel: Recent Labs  Lab 01/07/23 2125 01/08/23 0912 01/08/23 1756 01/09/23 0448 01/10/23 0439  NA 132* 135 136 139 137  K 3.6 3.3* 3.9 4.0 3.8  CL 99 103 103 105 105  CO2 23 25 24 26 25   GLUCOSE 294* 177* 231* 202* 170*  BUN 25* 25* 27* 21 19  CREATININE 1.02* 1.10* 1.07* 0.90 0.79  CALCIUM 9.2 8.5* 8.4* 8.1* 7.7*  MG  --  1.9  --   --   --   PHOS  --  4.9*  --   --   --     GFR: Estimated Creatinine Clearance: 76 mL/min (by C-G formula based on SCr of 0.79 mg/dL).  Scheduled Meds:  Chlorhexidine Gluconate Cloth  6 each Topical Daily   insulin aspart  0-15 Units Subcutaneous TID WC   insulin glargine-yfgn  6 Units Subcutaneous QHS   levothyroxine  125 mcg Oral QAC breakfast   pantoprazole  40 mg Oral Daily   pregabalin  200 mg Oral TID   sodium chloride flush  10-40 mL Intracatheter Q12H   Continuous Infusions:  sodium chloride 75 mL/hr at 01/10/23 0400   cefTRIAXone (ROCEPHIN)  IV Stopped (01/09/23 1743)   sodium chloride irrigation     Imaging and lab data was personally reviewed DG CHEST PORT 1 VIEW  Result Date: 01/08/2023 CLINICAL DATA:  Cough EXAM: PORTABLE CHEST 1 VIEW COMPARISON:  09/25/2022 FINDINGS: Lungs are clear.  No pleural effusion or pneumothorax. The heart is normal in size. Postsurgical changes related to prior CABG. Right chest port terminates cavoatrial junction.  Median sternotomy. IMPRESSION: No acute cardiopulmonary disease. Electronically Signed   By: Charline Bills M.D.   On: 01/08/2023 21:10    LOS: 2 days   Author: Calvert Cantor  01/10/2023 2:48 PM  To contact [facility]>   Check the care team in [facility] and look for the attending/consulting [facility] provider listed  Log into [email or web address] and use [place]'s universal password   Go to>  "[facility]"  and find provider  If you still have difficulty reaching the provider, please page the [facility] (Director on Call) for the Hospitalists listed on amion

## 2023-01-10 NOTE — Progress Notes (Signed)
MD notified of last BP 90/43 MAP 53. Pt asymptomatic. Will continue to monitor and await any new orders if any.

## 2023-01-10 NOTE — Progress Notes (Signed)
Admitted with hematuria and UTI and anemia Gross with clots for 6 weeks CT: 12 mm stone in right kidney and blood or mass in bladder and no hydro/not much clot on CT Urine c/s negative  Hb 6.6 on admission; today 7.7 (Hb was 14 in Dec 2023) Cr .79 Patient not on blood thinners and no rectal bleeding Received two units yesterday and one later today Vitals improved and now good Some clots irrigated but then pink urine or almost clear with relatively fast CBI Continue to manage with CBI No outside urologist

## 2023-01-11 DIAGNOSIS — D649 Anemia, unspecified: Secondary | ICD-10-CM | POA: Diagnosis not present

## 2023-01-11 LAB — TYPE AND SCREEN
ABO/RH(D): A NEG
Antibody Screen: NEGATIVE
Unit division: 0
Unit division: 0
Unit division: 0
Unit division: 0

## 2023-01-11 LAB — BASIC METABOLIC PANEL
Anion gap: 6 (ref 5–15)
BUN: 16 mg/dL (ref 8–23)
CO2: 27 mmol/L (ref 22–32)
Calcium: 8.1 mg/dL — ABNORMAL LOW (ref 8.9–10.3)
Chloride: 106 mmol/L (ref 98–111)
Creatinine, Ser: 0.68 mg/dL (ref 0.44–1.00)
GFR, Estimated: >60 mL/min (ref >60)
Glucose, Bld: 136 mg/dL — ABNORMAL HIGH (ref 70–99)
Potassium: 4 mmol/L (ref 3.5–5.1)
Sodium: 139 mmol/L (ref 135–145)

## 2023-01-11 LAB — BPAM RBC
Blood Product Expiration Date: 202405022359
Blood Product Expiration Date: 202405042359
Blood Product Expiration Date: 202405042359
Blood Product Expiration Date: 202405092359
ISSUE DATE / TIME: 202404130249
ISSUE DATE / TIME: 202404132040
ISSUE DATE / TIME: 202404132318
ISSUE DATE / TIME: 202404151741
Unit Type and Rh: 600
Unit Type and Rh: 600
Unit Type and Rh: 600
Unit Type and Rh: 600

## 2023-01-11 LAB — GLUCOSE, CAPILLARY
Glucose-Capillary: 198 mg/dL — ABNORMAL HIGH (ref 70–99)
Glucose-Capillary: 139 mg/dL — ABNORMAL HIGH (ref 70–99)
Glucose-Capillary: 199 mg/dL — ABNORMAL HIGH (ref 70–99)
Glucose-Capillary: 196 mg/dL — ABNORMAL HIGH (ref 70–99)

## 2023-01-11 LAB — CBC
HCT: 27.8 % — ABNORMAL LOW (ref 36.0–46.0)
Hemoglobin: 8.6 g/dL — ABNORMAL LOW (ref 12.0–15.0)
MCH: 29.9 pg (ref 26.0–34.0)
MCHC: 30.9 g/dL (ref 30.0–36.0)
MCV: 96.5 fL (ref 80.0–100.0)
Platelets: 100 10*3/uL — ABNORMAL LOW (ref 150–400)
RBC: 2.88 MIL/uL — ABNORMAL LOW (ref 3.87–5.11)
RDW: 17.5 % — ABNORMAL HIGH (ref 11.5–15.5)
WBC: 4.8 10*3/uL (ref 4.0–10.5)
nRBC: 0.6 % — ABNORMAL HIGH (ref 0.0–0.2)

## 2023-01-11 MED ORDER — INSULIN ASPART 100 UNIT/ML IJ SOLN
2.0000 [IU] | Freq: Three times a day (TID) | INTRAMUSCULAR | Status: DC
  Administered 2023-01-11 – 2023-01-16 (×14): 2 [IU] via SUBCUTANEOUS

## 2023-01-11 NOTE — Progress Notes (Signed)
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 Once time did not drain well and hard to irrigate Clinically not distended I changed out new 24 Fr cath from 22 Fr with re 40 cc pink return and one clot Hard to irrigate but not distended suspecting small spastic bladder CBI would run re clear to pink and no overdistension Bladder scan twice saw no bladder I think small contracted bladder with small clots now and again Will check with CT to confirm and check bladder for clots

## 2023-01-11 NOTE — Progress Notes (Signed)
[facility] Progress Note  Patient: Diana Kennedy     WUJ:[id number]  DOA: 01/07/2023   PCP: Coralee Rud, PA-C       Brief hospital course: This is a 67 year old female with endometrial cancer status post chemo and radiation, nephrolithiasis, diabetes mellitus type 2, hypothyroidism who presents to the hospital for left lower quadrant pain.  She admits to hematuria for about 1 month and has been following with urology in [facility].  Apparently, she had a 10 mm stone and is supposed to be getting a stent for this. In the ED she was found to have a hemoglobin of 6.6 and a hematocrit of 22-hemoglobin 10.9/hematocrit 33.2 on 11/27/2022 MCV 102.3 Sodium 132, potassium 3.6 BUN 25 and creatinine 1.02 CT scan of the abdomen pelvis without contrast: Layering hyperdensity in the right posterolateral bladder, 12 mm calculus in right proximal collecting system, additional punctate nonobstructing bilateral renal calculi, mild fullness of the over the left renal collecting system without frank hydronephrosis Status post hysterectomy with fiducial markers along the surgical bed no findings specific for recurrence or for metastatic disease    Subjective:  Still no complaints  Assessment and Plan: Principal Problem:   Symptomatic anemia-acute blood loss anemia secondary to gross hematuria- Hematuria painless Hypotension - 4/13> I and O cath performed revealed 600 cc of pure blood & CBI initiated - Hemoglobin improved to 7.3 with 1 unit of blood-  2 more units of blood on 1/14 (Hgb was aroiund 6.9 when checked) - Ferritin is 29 and iron saturation is 5-TIBC is slightly elevated at 451- - Feraheme given x 1 3/13 - Appreciate urology follow-up - 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 but urology has recommended we start ceftriaxone after sending another culture - A urine culture was sent when the patient was in the ED as well - culture from 4/12> Multiple species - still having pink  tinged urine - 4/15- Hgb 7.7 today- transfuse 1 more U PRBC   - Hgb 8.6  - BP much better but would like to follow in SDU again tonight  Active Problems:  Thrombocytopenia - due to acute blood loss- following   -Hypokalemia -replaced  Dehydration - Normal saline at 75 cc an hour-  -Follow continuous pulse ox to see if patient becomes hypoxic (fluid overload)  Elevated MCV - Vitamin B12 level is 145-  1000 mcg of vitamin B12 subcu given on 4/13    Uncontrolled type 2 diabetes mellitus with hyperglycemia, without long-term current use of insulin -Sliding scale insulin ordered with meals - Semglee   - Add novolog with meals - 2 U    Acquired hypothyroidism -Continue Synthroid    GERD without esophagitis -Continue Protonix    Obesity (BMI 30-39.9) Body mass index is 32.45 kg/m.      Code Status: Full Code Consultants: Urology Level of Care: Level of care: Stepdown Total time on patient care: 30 minutes DVT prophylaxis: SCDs  Objective:   Vitals:   01/11/23 0800 01/11/23 0900 01/11/23 1000 01/11/23 1125  BP: 135/81 (!) 116/54 (!) 105/55   Pulse: 81 85 79   Resp: Temp: 97.6 F (36.4 C)   97.9 F (36.6 C)  TempSrc: Oral   Oral  SpO2: 95% 93% 96%   Weight:      Height:       Filed Weights   01/07/23 1844  Weight: 88.5 kg   Exam: General exam: Appears comfortable  HEENT: oral mucosa  moist Respiratory system: Clear to auscultation.  Cardiovascular system: S1 & S2 heard  Gastrointestinal system: Abdomen soft, non-tender, nondistended. Normal bowel sounds   Extremities: No cyanosis, clubbing or edema Psychiatry:  Mood & affect appropriate.        CBC: Recent Labs  Lab 01/07/23 2125 01/08/23 0912 01/09/23 0448 01/09/23 1242 01/09/23 2045 01/10/23 0439 01/11/23 0558  WBC 9.1   < > 4.6 5.1 6.2 5.0 4.8  NEUTROABS 8.2*  --   --   --   --   --   --   HGB 6.6*   < > 8.2* 8.0* 8.2* 7.7* 8.6*  HCT 22.0*   < > 26.1* 25.8* 26.2* 25.3* 27.8*   MCV 102.3*   < > 95.6 96.6 97.4 98.1 96.5  PLT 173   < > 120* 127* 130* 120* 100*   < > = values in this interval not displayed.    Basic Metabolic Panel: Recent Labs  Lab 01/08/23 0912 01/08/23 1756 01/09/23 0448 01/10/23 0439 01/11/23 0558  NA 135 136 139 137 139  K 3.3* 3.9 4.0 3.8 4.0  CL 103 103 105 105 106  CO2 GLUCOSE 177* 231* 202* 170* 136*  BUN 25* 27* CREATININE 1.10* 1.07* 0.90 0.79 0.68  CALCIUM 8.5* 8.4* 8.1* 7.7* 8.1*  MG 1.9  --   --   --   --   PHOS 4.9*  --   --   --   --     GFR: Estimated Creatinine Clearance: 76 mL/min (by C-G formula based on SCr of 0.68 mg/dL).  Scheduled Meds:  Chlorhexidine Gluconate Cloth  6 each Topical Daily   insulin aspart  0-15 Units Subcutaneous TID WC   insulin glargine-yfgn  6 Units Subcutaneous QHS   levothyroxine  125 mcg Oral QAC breakfast   pantoprazole  40 mg Oral Daily   pregabalin  200 mg Oral TID   sodium chloride flush  10-40 mL Intracatheter Q12H   Continuous Infusions:  sodium chloride 75 mL/hr at 01/11/23 0800   cefTRIAXone (ROCEPHIN)  IV Stopped (01/10/23 1720)   sodium chloride irrigation     Imaging and lab data was personally reviewed No results found.  LOS: 3 days   Author: Calvert Cantor  01/11/2023 3:40 PM  To contact [facility]>   Check the care team in [facility] and look for the attending/consulting [facility] provider listed  Log into [email or web address] and use [place]'s universal password   Go to> "[facility]"  and find provider  If you still have difficulty reaching the provider, please page the [facility] (Director on Call) for the Hospitalists listed on amion

## 2023-01-12 ENCOUNTER — Inpatient Hospital Stay (HOSPITAL_COMMUNITY): Payer: Medicare Other

## 2023-01-12 DIAGNOSIS — D649 Anemia, unspecified: Secondary | ICD-10-CM | POA: Diagnosis not present

## 2023-01-12 LAB — CBC
HCT: 27 % — ABNORMAL LOW (ref 36.0–46.0)
Hemoglobin: 8.3 g/dL — ABNORMAL LOW (ref 12.0–15.0)
MCH: 30 pg (ref 26.0–34.0)
MCHC: 30.7 g/dL (ref 30.0–36.0)
MCV: 97.5 fL (ref 80.0–100.0)
Platelets: 116 10*3/uL — ABNORMAL LOW (ref 150–400)
RBC: 2.77 MIL/uL — ABNORMAL LOW (ref 3.87–5.11)
RDW: 17.2 % — ABNORMAL HIGH (ref 11.5–15.5)
WBC: 4.9 10*3/uL (ref 4.0–10.5)
nRBC: 0.8 % — ABNORMAL HIGH (ref 0.0–0.2)

## 2023-01-12 LAB — BASIC METABOLIC PANEL
Anion gap: 8 (ref 5–15)
BUN: 17 mg/dL (ref 8–23)
CO2: 27 mmol/L (ref 22–32)
Calcium: 8.2 mg/dL — ABNORMAL LOW (ref 8.9–10.3)
Chloride: 103 mmol/L (ref 98–111)
Creatinine, Ser: 0.8 mg/dL (ref 0.44–1.00)
GFR, Estimated: >60 mL/min (ref >60)
Glucose, Bld: 167 mg/dL — ABNORMAL HIGH (ref 70–99)
Potassium: 4.2 mmol/L (ref 3.5–5.1)
Sodium: 138 mmol/L (ref 135–145)

## 2023-01-12 LAB — GLUCOSE, CAPILLARY
Glucose-Capillary: 166 mg/dL — ABNORMAL HIGH (ref 70–99)
Glucose-Capillary: 172 mg/dL — ABNORMAL HIGH (ref 70–99)
Glucose-Capillary: 180 mg/dL — ABNORMAL HIGH (ref 70–99)
Glucose-Capillary: 186 mg/dL — ABNORMAL HIGH (ref 70–99)

## 2023-01-12 MED ORDER — SENNOSIDES-DOCUSATE SODIUM 8.6-50 MG PO TABS
1.0000 | ORAL_TABLET | Freq: Two times a day (BID) | ORAL | Status: DC
  Administered 2023-01-12 – 2023-01-16 (×9): 1 via ORAL
  Filled 2023-01-12 (×8): qty 1

## 2023-01-12 MED ORDER — VITAMIN B-12 1000 MCG PO TABS
1000.0000 ug | ORAL_TABLET | Freq: Every day | ORAL | Status: DC
  Administered 2023-01-12 – 2023-01-16 (×5): 1000 ug via ORAL
  Filled 2023-01-12 (×5): qty 1

## 2023-01-12 MED ORDER — SODIUM CHLORIDE 0.9 % IV SOLN
INTRAVENOUS | Status: AC
  Filled 2023-01-12: qty 250

## 2023-01-12 MED ORDER — TAMSULOSIN HCL 0.4 MG PO CAPS
0.4000 mg | ORAL_CAPSULE | Freq: Every day | ORAL | Status: DC
  Administered 2023-01-12 – 2023-01-16 (×5): 0.4 mg via ORAL
  Filled 2023-01-12 (×5): qty 1

## 2023-01-12 MED ORDER — POLYETHYLENE GLYCOL 3350 17 G PO PACK
17.0000 g | PACK | Freq: Every day | ORAL | Status: DC
  Administered 2023-01-13 – 2023-01-16 (×4): 17 g via ORAL
  Filled 2023-01-12 (×5): qty 1

## 2023-01-12 MED ORDER — OXYBUTYNIN CHLORIDE 5 MG PO TABS
5.0000 mg | ORAL_TABLET | Freq: Three times a day (TID) | ORAL | Status: DC | PRN
  Administered 2023-01-12 – 2023-01-15 (×5): 5 mg via ORAL
  Filled 2023-01-12 (×5): qty 1

## 2023-01-12 MED ORDER — SODIUM CHLORIDE (PF) 0.9 % IJ SOLN
INTRAMUSCULAR | Status: AC
  Filled 2023-01-12: qty 50

## 2023-01-12 MED ORDER — IOHEXOL 350 MG/ML SOLN
100.0000 mL | Freq: Once | INTRAVENOUS | Status: AC | PRN
  Administered 2023-01-12: 100 mL via INTRAVENOUS

## 2023-01-12 MED ORDER — SODIUM CHLORIDE 0.9 % IV SOLN
1.0000 g | INTRAVENOUS | Status: AC
  Administered 2023-01-12: 1 g via INTRAVENOUS
  Filled 2023-01-12: qty 10

## 2023-01-12 NOTE — Progress Notes (Signed)
  Transition of Care [facility]) Screening Note   Patient Details  Name: Diana Kennedy Date of Birth: Aug 23, 1956   Transition of Care [facility]) CM/SW Contact:    Lavenia Atlas, RN Phone Number: 01/12/2023, 1:12 PM  Per chart review patient being treated for symptomatic anemia, no current TOC needs.  Transition of Care Department [facility]) has reviewed patient and no TOC needs have been identified at this time. We will continue to monitor patient advancement through interdisciplinary progression rounds. If new patient transition needs arise, please place a TOC consult.

## 2023-01-12 NOTE — Progress Notes (Signed)
PROGRESS NOTE  Diana Kennedy  ZOX:[id number] DOB: 10/03/1955 DOA: 01/07/2023 PCP: Coralee Rud, PA-C   Brief Narrative:  Patient is a 67 year old female with history of endometrial cancer status post chemo/radiation, nephrolithiasis, diabetes type 2, hypothyroidism who presented here with complaint of left lower quadrant pain, blood in the urine.  On presentation her hemoglobin was 6.6.  CT abdomen/pelvis showed layering hyperdensity in the right posterolateral bladder, 12 mm calculus in the right proximal collecting system.  Urology consulted for gross hematuria.  Plan for follow-up CT abdomen/pelvis.  Currently hemoglobin in the range of 8.  Assessment & Plan:  Principal Problem:   Symptomatic anemia Active Problems:   Hematuria   Abdominal pain   Elevated MCV   Uncontrolled type 2 diabetes mellitus with hyperglycemia, without long-term current use of insulin   Acquired hypothyroidism   GERD without esophagitis   Obesity (BMI 30-39.9)   Acute blood loss anemia   Acute blood loss anemia secondary to gross hematuria: Presented with red urine.  I and O cath performed on 4/13 drained 600 cc of pure blood.  CBI initiated. Status post total of 4 units of PRBC transfusion.  Given IV iron infusion. Continue to monitor hemoglobin.  Currently stable in the 8.  Urine is still pinkish/red in color.  Urology following.  Plan for follow-up CT to confirm/check clots.  Recommended discomfort twice a day.  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  Also started on empiric ceftriaxone.  Urine culture showed multiple sepsis.  Will complete 5 days course of antibiotics today.  History of endometrial cancer: Status post hysterectomy on 2020, had pelvic recurrence, treated with radiation.  Will recommend to follow-up with oncology as an outpatient  Thrombocytopenia: Mild.  Continue to monitor.  Hypokalemia: Supplemented and  corrected  Vitamin B12 deficiency: Has macrocytosis.  Vitamin B12 of 145.  Given a dose of subcutaneous vitamin B12 injection.  Continue oral supplementation  Diabetes type 2 with hyperglycemia: Continue current insulin regimen.  Monitor blood sugars.  A1c of 7.5 as per 4/13  Hypothyroidism: Continue Synthyroid  History of GERD: Continue Protonix  Constipation: Continue bowel regimen  Obesity: BMI 32.4          DVT prophylaxis:SCDs Start: 01/08/23 0300     Code Status: Full Code  Family Communication: None at bedside  Patient status:Inpatient  Patient is from :Home  Anticipated discharge WJ:XBJY  Estimated DC date:1-2 days, needs clearance from urology   Consultants: Urology  Procedures: None  Antimicrobials:  Anti-infectives (From admission, onward)    Start     Dose/Rate Route Frequency Ordered Stop   01/08/23 1800  cefTRIAXone (ROCEPHIN) 1 g in sodium chloride 0.9 % 100 mL IVPB        1 g 200 mL/hr over 30 Minutes Intravenous Every 24 hours 01/08/23 1656         Subjective: Patient seen and examined at the bedside today.  Hemodynamically stable.  Comfortable, lying in bed.  Complains of some bladder spasms.  Foley catheter is still draining pinkish/red urine.  Objective: Vitals:   01/12/23 0400 01/12/23 0500 01/12/23 0533 01/12/23 0600  BP: 115/69 133/73  127/79  Pulse: 66 70 72 64  Resp: Temp:      TempSrc:      SpO2: 97% 97% 97% 99%  Weight:      Height:        Intake/Output Summary (Last 24 hours) at  01/12/2023 0808 Last data filed at 01/12/2023 0729 Gross per 24 hour  Intake 14774.88 ml  Output 16109 ml  Net -16975.12 ml   Filed Weights   01/07/23 1844  Weight: 88.5 kg    Examination:  General exam: Overall comfortable, not in distress, obese HEENT: PERRL Respiratory system:  no wheezes or crackles  Cardiovascular system: S1 & S2 heard, RRR.  Gastrointestinal system: Abdomen is nondistended, soft and  nontender. Central nervous system: Alert and oriented Extremities: No edema, no clubbing ,no cyanosis Skin: No rashes, no ulcers,no icterus   GU: Foley with pink urine  Data Reviewed: I have personally reviewed following labs and imaging studies  CBC: Recent Labs  Lab 01/07/23 2125 01/08/23 0912 01/09/23 1242 01/09/23 2045 01/10/23 0439 01/11/23 0558 01/12/23 0112  WBC 9.1   < > 5.1 6.2 5.0 4.8 4.9  NEUTROABS 8.2*  --   --   --   --   --   --   HGB 6.6*   < > 8.0* 8.2* 7.7* 8.6* 8.3*  HCT 22.0*   < > 25.8* 26.2* 25.3* 27.8* 27.0*  MCV 102.3*   < > 96.6 97.4 98.1 96.5 97.5  PLT 173   < > 127* 130* 120* 100* 116*   < > = values in this interval not displayed.   Basic Metabolic Panel: Recent Labs  Lab 01/08/23 0912 01/08/23 1756 01/09/23 0448 01/10/23 0439 01/11/23 0558 01/12/23 0112  NA 135 136 139 137 139 138  K 3.3* 3.9 4.0 3.8 4.0 4.2  CL 103 103 105 105 106 103  CO2 25 24 26 25 27 27   GLUCOSE 177* 231* 202* 170* 136* 167*  BUN 25* 27* 21 19 16 17   CREATININE 1.10* 1.07* 0.90 0.79 0.68 0.80  CALCIUM 8.5* 8.4* 8.1* 7.7* 8.1* 8.2*  MG 1.9  --   --   --   --   --   PHOS 4.9*  --   --   --   --   --      Recent Results (from the past 240 hour(s))  Urine Culture (for pregnant, neutropenic or urologic patients or patients with an indwelling urinary catheter)     Status: Abnormal   Collection Time: 01/07/23  6:47 PM   Specimen: Urine, Clean Catch  Result Value Ref Range Status   Specimen Description   Final    URINE, CLEAN CATCH Performed at [facility], [address], [place], [place] [address]    Special Requests   Final    NONE Performed at [facility], [address] [place], [place] [address]    Culture MULTIPLE SPECIES PRESENT, SUGGEST RECOLLECTION (A)  Final   Report Status 01/10/2023 FINAL  Final  MRSA Next Gen by PCR, Nasal     Status: None   Collection Time: 01/09/23  4:37 AM   Specimen: Nasal Mucosa; Nasal  Swab  Result Value Ref Range Status   MRSA by PCR Next Gen NOT DETECTED NOT DETECTED Final    Comment: (NOTE) The GeneXpert MRSA Assay (FDA approved for NASAL specimens only), is one component of a comprehensive MRSA colonization surveillance program. It is not intended to diagnose MRSA infection nor to guide or monitor treatment for MRSA infections. Test performance is not FDA approved in patients less than 55 years old. Performed at [facility], [address] [place], [place] [address]      Radiology Studies: No results found.  Scheduled Meds:  Chlorhexidine Gluconate Cloth  6 each Topical Daily   insulin aspart  0-15 Units Subcutaneous TID WC   insulin aspart  2 Units Subcutaneous TID WC   insulin glargine-yfgn  6 Units Subcutaneous QHS   levothyroxine  125 mcg Oral QAC breakfast   pantoprazole  40 mg Oral Daily   pregabalin  200 mg Oral TID   sodium chloride flush  10-40 mL Intracatheter Q12H   Continuous Infusions:  sodium chloride 75 mL/hr at 01/12/23 0210   cefTRIAXone (ROCEPHIN)  IV Stopped (01/11/23 1842)   sodium chloride irrigation       LOS: 4 days   Burnadette Pop, MD [facility] P4/17/2024, 8:08 AM

## 2023-01-12 NOTE — Progress Notes (Signed)
1700 Pt had a severe episode of pain, & not time for pain meds. RN with a 2nd nurse had to do a significant amount of flushing to clear 3-way of clots. Multiple clots returned. Flushed until clear. Pt's pain somewhat subsided. Paged & received call from Dr. Pete Glatter, who verbalized understanding event. He also ordered ditropan for spasms. Pt is settling down at this time. Will admin prn ditropan & oxycotin.

## 2023-01-12 NOTE — Progress Notes (Signed)
Pt transferred here from ICU. A&O x 4. IV flushed & patent. Called & spoke with Dr. Pete Glatter & requested order to flush 3-way foley. Pt c/o pain & was unable to offer pain medication immediately after transfer. Flushed foley after order given & obtained two clots. Pt stated she felt much better after flush.

## 2023-01-12 NOTE — Progress Notes (Signed)
HB stable  CBI clear with rare clot CT: might have small tumor in bladder and still some clot Spoke to Dr Berneice Heinrich Will see in am and might recommend cysto on Friday

## 2023-01-13 ENCOUNTER — Other Ambulatory Visit: Payer: Self-pay | Admitting: Urology

## 2023-01-13 DIAGNOSIS — D649 Anemia, unspecified: Secondary | ICD-10-CM | POA: Diagnosis not present

## 2023-01-13 LAB — CBC
HCT: 24.5 % — ABNORMAL LOW (ref 36.0–46.0)
Hemoglobin: 7.5 g/dL — ABNORMAL LOW (ref 12.0–15.0)
MCH: 30.2 pg (ref 26.0–34.0)
MCHC: 30.6 g/dL (ref 30.0–36.0)
MCV: 98.8 fL (ref 80.0–100.0)
Platelets: 118 10*3/uL — ABNORMAL LOW (ref 150–400)
RBC: 2.48 MIL/uL — ABNORMAL LOW (ref 3.87–5.11)
RDW: 17.2 % — ABNORMAL HIGH (ref 11.5–15.5)
WBC: 5.2 10*3/uL (ref 4.0–10.5)
nRBC: 0.4 % — ABNORMAL HIGH (ref 0.0–0.2)

## 2023-01-13 LAB — GLUCOSE, CAPILLARY
Glucose-Capillary: 162 mg/dL — ABNORMAL HIGH (ref 70–99)
Glucose-Capillary: 149 mg/dL — ABNORMAL HIGH (ref 70–99)
Glucose-Capillary: 176 mg/dL — ABNORMAL HIGH (ref 70–99)
Glucose-Capillary: 139 mg/dL — ABNORMAL HIGH (ref 70–99)

## 2023-01-13 NOTE — Progress Notes (Signed)
Mobility Specialist - Progress Note   01/13/23 1348  Mobility  Activity Ambulated with assistance in hallway  Level of Assistance Standby assist, set-up cues, supervision of patient - no hands on  Assistive Device Front wheel walker  Distance Ambulated (ft) 160 ft  Activity Response Tolerated well  Mobility Referral Yes  $Mobility charge 1 Mobility   Pt received in bed and agreeable to mobility. No complaints during session. Pt to bed for meal after session with all needs met.    [facility]

## 2023-01-13 NOTE — Progress Notes (Signed)
I reviewed the medical record.  I spoke to the nursing staff. There may have been a few times that they irrigated the small clot by the nurse that has been draining well.  Urine clear to pink.  Hemoglobin 7.5 which is trended down some but vitals normal.  Patient not having pain in alert and stable  CT scan reviewed with my partner Dr. Berneice Heinrich.  The plan is for the patient to have anesthesia tomorrow with cystoscopy and irrigate small amount of clot.  She would have a bladder biopsy and or resection and/or fulguration as needed and I consented this.  I reviewed risks pros and cons and even bladder perforation with sequelae.  Consent signed.  I will make her n.p.o. tonight.  I think this is best for her in terms of bladder management.  Medical team will continue to follow and provide supportive care

## 2023-01-13 NOTE — Care Management Important Message (Signed)
Important Message  Patient Details IM Letter given. Name: Diana Kennedy MRN: [id number] Date of Birth: Jan 25, 1956   Medicare Important Message Given:  Yes     Caren Macadam 01/13/2023, 10:54 AM

## 2023-01-13 NOTE — Progress Notes (Addendum)
PROGRESS NOTE  Diana Kennedy  EAV:[id number] DOB: 1956/04/24 DOA: 01/07/2023 PCP: Coralee Rud, PA-C   Brief Narrative:  Patient is a 67 year old female with history of endometrial cancer status post chemo/radiation, nephrolithiasis, diabetes type 2, hypothyroidism who presented here with complaint of left lower quadrant pain, blood in the urine.  On presentation, her hemoglobin was 6.6.  CT abdomen/pelvis showed layering hyperdensity in the right posterolateral bladder, 12 mm calculus in the right proximal collecting system.  Urology consulted for gross hematuria.  Plan for cystoscopy tomorrow.  Currently on CBI  Assessment & Plan:  Principal Problem:   Symptomatic anemia Active Problems:   Hematuria   Abdominal pain   Elevated MCV   Uncontrolled type 2 diabetes mellitus with hyperglycemia, without long-term current use of insulin   Acquired hypothyroidism   GERD without esophagitis   Obesity (BMI 30-39.9)   Acute blood loss anemia   Acute blood loss anemia secondary to gross hematuria: Presented with red urine.  I and O cath performed on 4/13 drained 600 cc of pure blood.  CBI initiated. Status post total of 4 units of PRBC transfusion.  Given IV iron infusion. Continue to monitor hemoglobin.  Currently stable in the 7-8.  Urine is clearing now. Urology following.   Complained of bladder spasms, started on tamsulosin, ditropan CT abd/pelvis done for follow up showed presumed hematoma in the bladder, small area of nodular enhancement along the wall along the left side of the bladder base measuring 7 mm,bilateral nonobstructing renal stones.    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  Also started on empiric ceftriaxone.  Urine culture showed multiple sepsis.  Completed  5 days course of antibiotics today.  History of endometrial cancer: Status post hysterectomy on 2020, had pelvic recurrence, treated with radiation.  Will recommend to follow-up with oncology as an  outpatient.  Follows with Dr. Janann August  at [facility]  Cystic lesion on the body of pancreas: Incidentally noted on CT abdomen/pelvis which showed 10 mm cystic lesion along the body of the pancreas.We recommend follow-up with MRCP in a year.  Abdominal aortic aneurysm: 3.3 cm abdominal aortic aneurysm seem. Recommend follow-up ultrasound every 3years.   Thrombocytopenia: Mild.  Continue to monitor.  Hypokalemia: Supplemented and corrected  Vitamin B12 deficiency: Has macrocytosis.  Vitamin B12 of 145.  Given a dose of subcutaneous vitamin B12 injection.  Continue oral supplementation  Diabetes type 2 with hyperglycemia: Continue current insulin regimen.  Monitor blood sugars.  A1c of 7.5 as per 4/13  Hypothyroidism: Continue Synthyroid  History of GERD: Continue Protonix  Constipation: Continue bowel regimen  Obesity: BMI 32.4          DVT prophylaxis:SCDs Start: 01/08/23 0300     Code Status: Full Code  Family Communication: Called daughter Engineer, manufacturing on phone on 4/18,call not received  Patient status:Inpatient  Patient is from :Home  Anticipated discharge NW:GNFA  Estimated DC date:1-2 days, needs clearance from urology   Consultants: Urology  Procedures: None  Antimicrobials:  Anti-infectives (From admission, onward)    Start     Dose/Rate Route Frequency Ordered Stop   01/12/23 1800  cefTRIAXone (ROCEPHIN) 1 g in sodium chloride 0.9 % 100 mL IVPB        1 g 200 mL/hr over 30 Minutes Intravenous Every 24 hours 01/12/23 0818 01/12/23 1900   01/08/23 1800  cefTRIAXone (ROCEPHIN) 1 g in sodium chloride 0.9 % 100 mL IVPB  Status:  Discontinued  1 g 200 mL/hr over 30 Minutes Intravenous Every 24 hours 01/08/23 1656 01/12/23 0818       Subjective: Patient seen and examined at bedside today.  She looks more comfortable today.  Bladder spasms better after starting on tamsulosin/oxybutynin.  Urine is clearing up.   Objective: Vitals:   01/13/23 0251  01/13/23 0551 01/13/23 0850 01/13/23 1207  BP: 103/75 (!) 100/54 114/62 (!) 117/58  Pulse: 80 75 76 71  Resp: Temp: 98.1 F (36.7 C) 98 F (36.7 C) 98.1 F (36.7 C) 98.1 F (36.7 C)  TempSrc: Oral Oral Oral Oral  SpO2: 95% 94% 97% 97%  Weight:      Height:        Intake/Output Summary (Last 24 hours) at 01/13/2023 1252 Last data filed at 01/13/2023 1200 Gross per 24 hour  Intake 6480 ml  Output 09811 ml  Net -14465 ml   Filed Weights   01/07/23 1844  Weight: 88.5 kg    Examination:  General exam: Overall comfortable, not in distress,obese HEENT: PERRL Respiratory system:  no wheezes or crackles  Cardiovascular system: S1 & S2 heard, RRR.  Gastrointestinal system: Abdomen is mildly distended, soft and nontender. Central nervous system: Alert and oriented Extremities: No edema, no clubbing ,no cyanosis Skin: No rashes, no ulcers,no icterus   GU: Foley  Data Reviewed: I have personally reviewed following labs and imaging studies  CBC: Recent Labs  Lab 01/07/23 2125 01/08/23 0912 01/09/23 2045 01/10/23 0439 01/11/23 0558 01/12/23 0112 01/13/23 0329  WBC 9.1   < > 6.2 5.0 4.8 4.9 5.2  NEUTROABS 8.2*  --   --   --   --   --   --   HGB 6.6*   < > 8.2* 7.7* 8.6* 8.3* 7.5*  HCT 22.0*   < > 26.2* 25.3* 27.8* 27.0* 24.5*  MCV 102.3*   < > 97.4 98.1 96.5 97.5 98.8  PLT 173   < > 130* 120* 100* 116* 118*   < > = values in this interval not displayed.   Basic Metabolic Panel: Recent Labs  Lab 01/08/23 0912 01/08/23 1756 01/09/23 0448 01/10/23 0439 01/11/23 0558 01/12/23 0112  NA 135 136 139 137 139 138  K 3.3* 3.9 4.0 3.8 4.0 4.2  CL 103 103 105 105 106 103  CO2 GLUCOSE 177* 231* 202* 170* 136* 167*  BUN 25* 27* CREATININE 1.10* 1.07* 0.90 0.79 0.68 0.80  CALCIUM 8.5* 8.4* 8.1* 7.7* 8.1* 8.2*  MG 1.9  --   --   --   --   --   PHOS 4.9*  --   --   --   --   --      Recent Results (from the past 240 hour(s))   Urine Culture (for pregnant, neutropenic or urologic patients or patients with an indwelling urinary catheter)     Status: Abnormal   Collection Time: 01/07/23  6:47 PM   Specimen: Urine, Clean Catch  Result Value Ref Range Status   Specimen Description   Final    URINE, CLEAN CATCH Performed at [facility], [address] [place], [place] [address]    Special Requests   Final    NONE Performed at [facility], [address], [place], [place] [address]    Culture MULTIPLE SPECIES PRESENT, SUGGEST RECOLLECTION (A)  Final   Report Status 01/10/2023 FINAL  Final  MRSA Next Gen by PCR, Nasal     Status: None   Collection Time: 01/09/23  4:37 AM   Specimen: Nasal Mucosa; Nasal Swab  Result Value Ref Range Status   MRSA by PCR Next Gen NOT DETECTED NOT DETECTED Final    Comment: (NOTE) The GeneXpert MRSA Assay (FDA approved for NASAL specimens only), is one component of a comprehensive MRSA colonization surveillance program. It is not intended to diagnose MRSA infection nor to guide or monitor treatment for MRSA infections. Test performance is not FDA approved in patients less than 73 years old. Performed at [facility], [address], [place], [place] [address]      Radiology Studies: CT ABDOMEN PELVIS W WO CONTRAST  Result Date: 01/12/2023 CLINICAL DATA:  Gross hematuria. History of endometrioid adenocarcinoma EXAM: CT ABDOMEN AND PELVIS WITHOUT AND WITH CONTRAST TECHNIQUE: Multidetector CT imaging of the abdomen and pelvis was performed following the standard protocol before and following the bolus administration of intravenous contrast. RADIATION DOSE REDUCTION: This exam was performed according to the departmental dose-optimization program which includes automated exposure control, adjustment of the mA and/or kV according to patient size and/or use of iterative reconstruction technique. CONTRAST:  OMNIPAQUE IOHEXOL  350 MG/ML SOLN COMPARISON:  Noncontrast CT 01/07/2023 and older FINDINGS: Lower chest: Status post median sternotomy. Coronary artery calcifications are seen. There is some basilar atelectasis. Heart is slightly enlarged. Breathing motion. Hepatobiliary: Diffuse fatty liver infiltration identified with some focal fat deposition additionally along the posterior aspect of the segment 4. Patent portal vein. The gallbladder is nondilated. Pancreas: small cystic lesion along the body/tail of the pancreas measuring 10 mm. Best seen on series 10, image 40 of the coronal data set. Recommend continued surveillance. Spleen: Normal in size without focal abnormality. Adrenals/Urinary Tract: The adrenal glands are preserved. There is moderate atrophy of the right kidney. Once again there is a 12 mm stone in the right renal pelvis. Additional punctate foci again noted as on prior. Left kidney also has some lower pole punctate foci. No ureteral stones. No enhancing renal mass. Foley catheter in bladder with once again dependent ill-defined high density which may represent hemorrhage. There is punctate calcification as well in the bladder which is new on series 4, image 83. Possible stone. There is a questionable area of nodular enhancement along left side of the bladder base best seen on series 4 image 88 and series 10 image 49 measuring 7 mm. A subtle bladder lesion is possible. Further workup when clinically appropriate. Stomach/Bowel: Moderate diffuse colonic stool. Normal appendix. Scattered debris in the stomach. No free air or free fluid. Moderate nonspecific presacral and perirectal fat stranding, slightly increased from previous. Vascular/Lymphatic: Diffuse vascular calcifications identified. Once again there is an infrarenal abdominal aortic aneurysm measuring 3.3 x 3.1 cm. Areas of severe stenosis suggested along the common iliac vessels. Please correlate with particular symptoms and further workup when appropriate. No  specific abnormal lymph node enlargement identified in the abdomen and pelvis. Reproductive: Status post hysterectomy. No adnexal masses. Other: No free air or free fluid. Musculoskeletal: Curvature of the spine with degenerative changes. There is also fixation hardware along the lumbar spine. IMPRESSION: Once again evidence of presumed hematoma in the bladder dependently. There is a small area of nodular enhancement along the wall along the left side of the bladder base measuring 7 mm. Intrinsic bladder lesion is possible recommend further workup when appropriate. Bilateral nonobstructing renal stones. Also new small punctate calcification  in the bladder adjacent to the catheter of the Foley. This could represent a small bladder stone which was not clearly seen previously. 10 mm cystic lesion along the body of the pancreas. Based on size and appearance would recommend additional evaluation in 1 year. This could be performed with MRCP. Fatty liver infiltration. Diffuse vascular calcifications identified with potential significant stenosis along the common iliac arteries. Please correlate with any particular symptoms. In addition there is a 3.3 cm abdominal aortic aneurysm. Recommend follow-up ultrasound every 3 years. This recommendation follows ACR consensus guidelines: White Paper of the ACR Incidental Findings Committee II on Vascular Findings. J Am Coll Radiol 2013; 10:789-794. Electronically Signed   By: Karen Kays M.D.   On: 01/12/2023 12:43    Scheduled Meds:  Chlorhexidine Gluconate Cloth  6 each Topical Daily   vitamin B-12  1,000 mcg Oral Daily   insulin aspart  0-15 Units Subcutaneous TID WC   insulin aspart  2 Units Subcutaneous TID WC   insulin glargine-yfgn  6 Units Subcutaneous QHS   levothyroxine  125 mcg Oral QAC breakfast   pantoprazole  40 mg Oral Daily   polyethylene glycol  17 g Oral Daily   pregabalin  200 mg Oral TID   senna-docusate  1 tablet Oral BID   sodium chloride flush   10-40 mL Intracatheter Q12H   tamsulosin  0.4 mg Oral Daily   Continuous Infusions:  sodium chloride irrigation       LOS: 5 days   Burnadette Pop, MD [facility] P4/18/2024, 12:52 PM

## 2023-01-14 ENCOUNTER — Inpatient Hospital Stay (HOSPITAL_COMMUNITY): Payer: Medicare Other | Admitting: Certified Registered Nurse Anesthetist

## 2023-01-14 ENCOUNTER — Inpatient Hospital Stay (HOSPITAL_COMMUNITY): Admission: RE | Admit: 2023-01-14 | Payer: Medicare Other | Source: Home / Self Care | Admitting: Urology

## 2023-01-14 ENCOUNTER — Inpatient Hospital Stay (HOSPITAL_COMMUNITY): Payer: Medicare Other

## 2023-01-14 ENCOUNTER — Encounter (HOSPITAL_COMMUNITY): Payer: Self-pay | Admitting: Internal Medicine

## 2023-01-14 ENCOUNTER — Encounter (HOSPITAL_COMMUNITY)
Admission: EM | Disposition: A | Discharge status: Home / Self Care | Payer: Self-pay | Source: Home / Self Care | Attending: Internal Medicine

## 2023-01-14 DIAGNOSIS — E1151 Type 2 diabetes mellitus with diabetic peripheral angiopathy without gangrene: Secondary | ICD-10-CM

## 2023-01-14 DIAGNOSIS — Z7984 Long term (current) use of oral hypoglycemic drugs: Secondary | ICD-10-CM

## 2023-01-14 DIAGNOSIS — D638 Anemia in other chronic diseases classified elsewhere: Secondary | ICD-10-CM

## 2023-01-14 DIAGNOSIS — R310 Gross hematuria: Secondary | ICD-10-CM

## 2023-01-14 DIAGNOSIS — D649 Anemia, unspecified: Secondary | ICD-10-CM | POA: Diagnosis not present

## 2023-01-14 DIAGNOSIS — E039 Hypothyroidism, unspecified: Secondary | ICD-10-CM

## 2023-01-14 HISTORY — PX: CYSTOSCOPY W/ URETERAL STENT PLACEMENT: SHX1429

## 2023-01-14 HISTORY — PX: TRANSURETHRAL RESECTION OF BLADDER TUMOR: SHX2575

## 2023-01-14 LAB — BASIC METABOLIC PANEL
Anion gap: 8 (ref 5–15)
BUN: 15 mg/dL (ref 8–23)
CO2: 29 mmol/L (ref 22–32)
Calcium: 8.6 mg/dL — ABNORMAL LOW (ref 8.9–10.3)
Chloride: 99 mmol/L (ref 98–111)
Creatinine, Ser: 0.75 mg/dL (ref 0.44–1.00)
GFR, Estimated: >60 mL/min (ref >60)
Glucose, Bld: 162 mg/dL — ABNORMAL HIGH (ref 70–99)
Potassium: 3.9 mmol/L (ref 3.5–5.1)
Sodium: 136 mmol/L (ref 135–145)

## 2023-01-14 LAB — CBC
HCT: 24.3 % — ABNORMAL LOW (ref 36.0–46.0)
Hemoglobin: 7.5 g/dL — ABNORMAL LOW (ref 12.0–15.0)
MCH: 30.5 pg (ref 26.0–34.0)
MCHC: 30.9 g/dL (ref 30.0–36.0)
MCV: 98.8 fL (ref 80.0–100.0)
Platelets: 112 10*3/uL — ABNORMAL LOW (ref 150–400)
RBC: 2.46 MIL/uL — ABNORMAL LOW (ref 3.87–5.11)
RDW: 17.2 % — ABNORMAL HIGH (ref 11.5–15.5)
WBC: 5.8 10*3/uL (ref 4.0–10.5)
nRBC: 0.5 % — ABNORMAL HIGH (ref 0.0–0.2)

## 2023-01-14 LAB — GLUCOSE, CAPILLARY
Glucose-Capillary: 137 mg/dL — ABNORMAL HIGH (ref 70–99)
Glucose-Capillary: 116 mg/dL — ABNORMAL HIGH (ref 70–99)
Glucose-Capillary: 97 mg/dL (ref 70–99)
Glucose-Capillary: 224 mg/dL — ABNORMAL HIGH (ref 70–99)

## 2023-01-14 SURGERY — TURBT (TRANSURETHRAL RESECTION OF BLADDER TUMOR)
Anesthesia: General

## 2023-01-14 MED ORDER — MEPERIDINE HCL 50 MG/ML IJ SOLN: 6.2500 mg | INTRAMUSCULAR | Status: DC | PRN

## 2023-01-14 MED ORDER — FENTANYL CITRATE (PF) 100 MCG/2ML IJ SOLN
INTRAMUSCULAR | Status: DC | PRN
  Administered 2023-01-14: 25 ug via INTRAVENOUS

## 2023-01-14 MED ORDER — PROMETHAZINE HCL 25 MG/ML IJ SOLN: 6.2500 mg | INTRAMUSCULAR | Status: DC | PRN

## 2023-01-14 MED ORDER — MIDAZOLAM HCL 2 MG/2ML IJ SOLN
INTRAMUSCULAR | Status: DC | PRN
  Administered 2023-01-14: 2 mg via INTRAVENOUS

## 2023-01-14 MED ORDER — DEXAMETHASONE SODIUM PHOSPHATE 10 MG/ML IJ SOLN
INTRAMUSCULAR | Status: AC
  Filled 2023-01-14: qty 1

## 2023-01-14 MED ORDER — DEXAMETHASONE SODIUM PHOSPHATE 10 MG/ML IJ SOLN
INTRAMUSCULAR | Status: DC | PRN
  Administered 2023-01-14: 5 mg via INTRAVENOUS

## 2023-01-14 MED ORDER — OXYCODONE HCL 5 MG PO TABS: 5.0000 mg | ORAL_TABLET | Freq: Once | ORAL | Status: DC | PRN

## 2023-01-14 MED ORDER — LACTATED RINGERS IV SOLN: INTRAVENOUS | Status: DC

## 2023-01-14 MED ORDER — ONDANSETRON HCL 4 MG/2ML IJ SOLN
INTRAMUSCULAR | Status: AC
  Filled 2023-01-14: qty 2

## 2023-01-14 MED ORDER — PHENYLEPHRINE 80 MCG/ML (10ML) SYRINGE FOR IV PUSH (FOR BLOOD PRESSURE SUPPORT)
PREFILLED_SYRINGE | INTRAVENOUS | Status: AC
  Filled 2023-01-14: qty 10

## 2023-01-14 MED ORDER — FENTANYL CITRATE (PF) 100 MCG/2ML IJ SOLN
INTRAMUSCULAR | Status: AC
  Filled 2023-01-14: qty 2

## 2023-01-14 MED ORDER — STERILE WATER FOR IRRIGATION IR SOLN
Status: DC | PRN
  Administered 2023-01-14: 1000 mL

## 2023-01-14 MED ORDER — CEFAZOLIN SODIUM-DEXTROSE 2-3 GM-%(50ML) IV SOLR
INTRAVENOUS | Status: DC | PRN
  Administered 2023-01-14: 2 g via INTRAVENOUS

## 2023-01-14 MED ORDER — OXYCODONE HCL 5 MG/5ML PO SOLN: 5.0000 mg | Freq: Once | ORAL | Status: DC | PRN

## 2023-01-14 MED ORDER — PROPOFOL 10 MG/ML IV BOLUS
INTRAVENOUS | Status: AC
  Filled 2023-01-14: qty 20

## 2023-01-14 MED ORDER — BISACODYL 10 MG RE SUPP
10.0000 mg | Freq: Once | RECTAL | Status: AC
  Administered 2023-01-14: 10 mg via RECTAL
  Filled 2023-01-14: qty 1

## 2023-01-14 MED ORDER — MIDAZOLAM HCL 2 MG/2ML IJ SOLN
INTRAMUSCULAR | Status: AC
  Filled 2023-01-14: qty 2

## 2023-01-14 MED ORDER — CHLORHEXIDINE GLUCONATE 0.12 % MT SOLN
15.0000 mL | Freq: Once | OROMUCOSAL | Status: AC
  Administered 2023-01-14: 15 mL via OROMUCOSAL

## 2023-01-14 MED ORDER — IOHEXOL 300 MG/ML  SOLN
INTRAMUSCULAR | Status: DC | PRN
  Administered 2023-01-14: 20 mL

## 2023-01-14 MED ORDER — MIDAZOLAM HCL 2 MG/2ML IJ SOLN: 0.5000 mg | Freq: Once | INTRAMUSCULAR | Status: DC | PRN

## 2023-01-14 MED ORDER — SODIUM CHLORIDE 0.9 % IR SOLN
Status: DC | PRN
  Administered 2023-01-14: 9000 mL via INTRAVESICAL

## 2023-01-14 MED ORDER — PROPOFOL 500 MG/50ML IV EMUL
INTRAVENOUS | Status: DC | PRN
  Administered 2023-01-14: 120 mg via INTRAVENOUS
  Administered 2023-01-14: 80 mg via INTRAVENOUS

## 2023-01-14 MED ORDER — HYDROMORPHONE HCL 1 MG/ML IJ SOLN: 0.2500 mg | INTRAMUSCULAR | Status: DC | PRN

## 2023-01-14 MED ORDER — CEFAZOLIN SODIUM 1 G IJ SOLR
INTRAMUSCULAR | Status: AC
  Filled 2023-01-14: qty 20

## 2023-01-14 MED ORDER — LIDOCAINE 2% (20 MG/ML) 5 ML SYRINGE
INTRAMUSCULAR | Status: DC | PRN
  Administered 2023-01-14: 40 mg via INTRAVENOUS

## 2023-01-14 MED ORDER — PHENYLEPHRINE 80 MCG/ML (10ML) SYRINGE FOR IV PUSH (FOR BLOOD PRESSURE SUPPORT)
PREFILLED_SYRINGE | INTRAVENOUS | Status: DC | PRN
  Administered 2023-01-14: 160 ug via INTRAVENOUS
  Administered 2023-01-14: 80 ug via INTRAVENOUS
  Administered 2023-01-14: 160 ug via INTRAVENOUS

## 2023-01-14 SURGICAL SUPPLY — 26 items
BAG DRN RND TRDRP ANRFLXCHMBR (UROLOGICAL SUPPLIES) ×3
BAG URINE DRAIN 2000ML AR STRL (UROLOGICAL SUPPLIES) ×1 IMPLANT
BAG URO CATCHER STRL LF (MISCELLANEOUS) ×3 IMPLANT
CATH HEMA 3WAY 30CC 24FR COUDE (CATHETERS) ×1 IMPLANT
CATH URETL OPEN END 6FR 70 (CATHETERS) ×1 IMPLANT
CATH UROLOGY TORQUE 40 (MISCELLANEOUS) ×1 IMPLANT
DRAPE FOOT SWITCH (DRAPES) ×3 IMPLANT
ELECT REM PT RETURN 15FT ADLT (MISCELLANEOUS) ×3 IMPLANT
EVACUATOR MICROVAS BLADDER (UROLOGICAL SUPPLIES) IMPLANT
GLOVE SURG LX STRL 7.5 STRW (GLOVE) ×3 IMPLANT
GOWN SRG XL LVL 4 BRTHBL STRL (GOWNS) ×3 IMPLANT
GOWN STRL NON-REIN XL LVL4 (GOWNS) ×3
GUIDEWIRE ANG ZIPWIRE 038X150 (WIRE) ×1 IMPLANT
HOLDER FOLEY CATH W/STRAP (MISCELLANEOUS) ×1 IMPLANT
KIT TURNOVER KIT A (KITS) IMPLANT
LOOP CUT BIPOLAR 24F LRG (ELECTROSURGICAL) ×1 IMPLANT
MANIFOLD NEPTUNE II (INSTRUMENTS) ×3 IMPLANT
PACK CYSTO (CUSTOM PROCEDURE TRAY) ×3 IMPLANT
PAD TELFA 2X3 NADH STRL (GAUZE/BANDAGES/DRESSINGS) ×1 IMPLANT
PLUG CATH AND CAP STER (CATHETERS) ×1 IMPLANT
STENT POLARIS 5FRX24 (STENTS) ×1 IMPLANT
SYR 30ML LL (SYRINGE) ×1 IMPLANT
SYR TOOMEY IRRIG 70ML (MISCELLANEOUS)
SYRINGE TOOMEY IRRIG 70ML (MISCELLANEOUS) IMPLANT
TUBING CONNECTING 10 (TUBING) ×3 IMPLANT
TUBING UROLOGY SET (TUBING) ×3 IMPLANT

## 2023-01-14 NOTE — Anesthesia Preprocedure Evaluation (Addendum)
Anesthesia Evaluation  Patient identified by MRN, date of birth, ID band Patient awake    Reviewed: Allergy & Precautions, NPO status , Patient's Chart, lab work & pertinent test results  History of Anesthesia Complications Negative for: history of anesthetic complications  Airway Mallampati: I  TM Distance: >3 FB Neck ROM: Full    Dental  (+) Edentulous Upper, Edentulous Lower   Pulmonary Current Smoker and Patient abstained from smoking.   breath sounds clear to auscultation       Cardiovascular hypertension (no longer on meds), (-) angina + CAD, + Cardiac Stents, + CABG and + Peripheral Vascular Disease   Rhythm:Regular Rate:Normal  '19 post CABG TEE:  Left ventricular systolic function is normal. Ejection Fraction = >55%. The right ventricle is normal in size and function. There is trace mitral regurgitation.     Neuro/Psych    Depression    negative neurological ROS     GI/Hepatic Neg liver ROS,GERD  Medicated and Controlled,,  Endo/Other  diabetes (glu 116), Oral Hypoglycemic AgentsHypothyroidism  BMI 32.5  Renal/GU negative Renal ROS     Musculoskeletal  (+)  Fibromyalgia -  Abdominal   Peds  Hematology  (+) Blood dyscrasia (Hb 7.5, plt 112k), anemia   Anesthesia Other Findings   Reproductive/Obstetrics                             Anesthesia Physical Anesthesia Plan  ASA: 4  Anesthesia Plan: General   Post-op Pain Management: Tylenol PO (pre-op)*   Induction: Intravenous  PONV Risk Score and Plan: 2 and Ondansetron, Dexamethasone and Treatment may vary due to age or medical condition  Airway Management Planned: LMA  Additional Equipment: None  Intra-op Plan:   Post-operative Plan:   Informed Consent: I have reviewed the patients History and Physical, chart, labs and discussed the procedure including the risks, benefits and alternatives for the proposed anesthesia  with the patient or authorized representative who has indicated his/her understanding and acceptance.       Plan Discussed with: CRNA and Surgeon  Anesthesia Plan Comments:        Anesthesia Quick Evaluation

## 2023-01-14 NOTE — Brief Op Note (Signed)
01/14/2023  4:00 PM  PATIENT:  Diana Kennedy  67 y.o. female  PRE-OPERATIVE DIAGNOSIS:  GROSS HEMATURIA  POST-OPERATIVE DIAGNOSIS:  GROSS HEMATURIA  PROCEDURE:  Procedure(s) with comments: TRANSURETHRAL RESECTION OF BLADDER TUMOR (TURBT) (N/A) - 45 MINS CYSTOSCOPY WITH BILATERAL RETROGRADE PYELOGRAM/ RIGHT URETERAL STENT PLACEMENT  SURGEON:  Surgeon(s) and Role:    * Akacia Boltz, Delbert Phenix., MD - Primary  PHYSICIAN ASSISTANT:   ASSISTANTS: none   ANESTHESIA:   general  EBL:  abtou Old clot, 50ml new blood   BLOOD ADMINISTERED:none  DRAINS:  27F 3 way foley to straight drain, irrigation port plugged.     LOCAL MEDICATIONS USED:  NONE  SPECIMEN:  Source of Specimen:  1 - bladder tumor; 2 - base of bladder tumor  DISPOSITION OF SPECIMEN:  PATHOLOGY  COUNTS:  YES  TOURNIQUET:  * No tourniquets in log *  DICTATION: .Other Dictation: Dictation Number [id number]  PLAN OF CARE: Admit to inpatient   PATIENT DISPOSITION:  PACU - hemodynamically stable.   Delay start of Pharmacological VTE agent (>24hrs) due to surgical blood loss or risk of bleeding: yes

## 2023-01-14 NOTE — Anesthesia Procedure Notes (Signed)
Procedure Name: LMA Insertion Date/Time: 01/14/2023 3:16 PM  Performed by: Basilio Cairo, CRNAPre-anesthesia Checklist: Patient identified, Patient being monitored, Timeout performed, Emergency Drugs available and Suction available Patient Re-evaluated:Patient Re-evaluated prior to induction Oxygen Delivery Method: Circle system utilized Preoxygenation: Pre-oxygenation with 100% oxygen Induction Type: IV induction Ventilation: Mask ventilation without difficulty LMA: LMA inserted LMA Size: 4.0 Tube type: Oral Number of attempts: 1 Placement Confirmation: positive ETCO2 and breath sounds checked- equal and bilateral Tube secured with: Tape Dental Injury: Teeth and Oropharynx as per pre-operative assessment

## 2023-01-14 NOTE — Transfer of Care (Signed)
Immediate Anesthesia Transfer of Care Note  Patient: Diana Kennedy  Procedure(s) Performed: Procedure(s) with comments: TRANSURETHRAL RESECTION OF BLADDER TUMOR (TURBT) (N/A) - 45 MINS CYSTOSCOPY WITH BILATERAL RETROGRADE PYELOGRAM/ RIGHT URETERAL STENT PLACEMENT  Patient Location: PACU  Anesthesia Type:General  Level of Consciousness: Alert, Awake, Oriented  Airway & Oxygen Therapy: Patient Spontanous Breathing  Post-op Assessment: Report given to RN  Post vital signs: Reviewed and stable  Last Vitals:  Vitals:   01/14/23 0515 01/14/23 1303  BP: (!) 103/54 110/60  Pulse: 78 75  Resp: 18 17  Temp: 36.4 C (!) 36.3 C  SpO2: 97% 94%    Complications: No apparent anesthesia complications

## 2023-01-14 NOTE — Anesthesia Postprocedure Evaluation (Signed)
Anesthesia Post Note  Patient: Diana Kennedy  Procedure(s) Performed: TRANSURETHRAL RESECTION OF BLADDER TUMOR (TURBT) CYSTOSCOPY WITH BILATERAL RETROGRADE PYELOGRAM/ RIGHT URETERAL STENT PLACEMENT     Patient location during evaluation: PACU Anesthesia Type: General Level of consciousness: sedated, oriented and patient cooperative Pain management: pain level controlled Vital Signs Assessment: post-procedure vital signs reviewed and stable Respiratory status: spontaneous breathing, nonlabored ventilation and respiratory function stable Cardiovascular status: blood pressure returned to baseline and stable Postop Assessment: no apparent nausea or vomiting Anesthetic complications: no   No notable events documented.  Last Vitals:  Vitals:   01/14/23 1618 01/14/23 1630  BP: 96/67 (!) 99/48  Pulse: 83 75  Resp: 10 11  Temp:    SpO2: 96% 98%    Last Pain:  Vitals:   01/14/23 1630  TempSrc:   PainSc: 0-No pain                 Tyson Parkison,E. Anniebell Bedore

## 2023-01-14 NOTE — Progress Notes (Signed)
Subjective/Chief Complaint:  1 - Gross Hematuria / Rule Out Bladder Neoplasm - problematic gross hematuria with clots 2024. She has h/o metastaic endometrial cancer with extensive pelvic radiation. CT this admission with ?left sided bladder neoplasm.   2 - Right Renal Stone - 12mm Rt renal stone incidetnal on imaging x several w/o sig hydro.   PMH sig for metastatic / recurrent endometrial cancer (TAH,BSO, chemo, XRT, then vaginal cuff brach for cuff recurrence managed by Lia F[facility] at [facility]), L spine fusion, obestiy, DM2 (A1c 7s), CAD/Stent, Carotid bypass.  Today "Diana Kennedy" is seen to proceed with endoscopic eval to r/o bladder neoplasm.    Objective: Vital signs in last 24 hours: Temp:  [97.6 F (36.4 C)-98.1 F (36.7 C)] 97.6 F (36.4 C) (04/19 0515) Pulse Rate:  [71-85] 78 (04/19 0515) Resp:  [17-18] 18 (04/19 0515) BP: (103-117)/(54-58) 103/54 (04/19 0515) SpO2:  [96 %-97 %] 97 % (04/19 0515) Last BM Date : 01/11/23  Intake/Output from previous day: 04/18 0701 - 04/19 0700 In: 6940 [P.O.:940] Out: 16109 [Urine:13845] Intake/Output this shift: Total I/O In: 6000 [Other:6000] Out: 0   NAD, Pleasant Non-labored breathing on RA RRR Obese abdomen, no CVAT No c/c/e  Lab Results:  Recent Labs    01/13/23 0329 01/14/23 0420  WBC 5.2 5.8  HGB 7.5* 7.5*  HCT 24.5* 24.3*  PLT 118* 112*   BMET Recent Labs    01/12/23 0112 01/14/23 0420  NA 138 136  K 4.2 3.9  CL 103 99  CO2 27 29  GLUCOSE 167* 162*  BUN 17 15  CREATININE 0.80 0.75  CALCIUM 8.2* 8.6*   PT/INR No results for input(s): "LABPROT", "INR" in the last 72 hours. ABG No results for input(s): "PHART", "HCO3" in the last 72 hours.  Invalid input(s): "PCO2", "PO2"  Studies/Results: CT ABDOMEN PELVIS W WO CONTRAST  Result Date: 01/12/2023 CLINICAL DATA:  Gross hematuria. History of endometrioid adenocarcinoma EXAM: CT ABDOMEN AND PELVIS WITHOUT AND WITH CONTRAST TECHNIQUE: Multidetector  CT imaging of the abdomen and pelvis was performed following the standard protocol before and following the bolus administration of intravenous contrast. RADIATION DOSE REDUCTION: This exam was performed according to the departmental dose-optimization program which includes automated exposure control, adjustment of the mA and/or kV according to patient size and/or use of iterative reconstruction technique. CONTRAST:  OMNIPAQUE IOHEXOL 350 MG/ML SOLN COMPARISON:  Noncontrast CT 01/07/2023 and older FINDINGS: Lower chest: Status post median sternotomy. Coronary artery calcifications are seen. There is some basilar atelectasis. Heart is slightly enlarged. Breathing motion. Hepatobiliary: Diffuse fatty liver infiltration identified with some focal fat deposition additionally along the posterior aspect of the segment 4. Patent portal vein. The gallbladder is nondilated. Pancreas: small cystic lesion along the body/tail of the pancreas measuring 10 mm. Best seen on series 10, image 40 of the coronal data set. Recommend continued surveillance. Spleen: Normal in size without focal abnormality. Adrenals/Urinary Tract: The adrenal glands are preserved. There is moderate atrophy of the right kidney. Once again there is a 12 mm stone in the right renal pelvis. Additional punctate foci again noted as on prior. Left kidney also has some lower pole punctate foci. No ureteral stones. No enhancing renal mass. Foley catheter in bladder with once again dependent ill-defined high density which may represent hemorrhage. There is punctate calcification as well in the bladder which is new on series 4, image 83. Possible stone. There is a questionable area of nodular enhancement along left side of the bladder  base best seen on series 4 image 88 and series 10 image 49 measuring 7 mm. A subtle bladder lesion is possible. Further workup when clinically appropriate. Stomach/Bowel: Moderate diffuse colonic stool. Normal appendix.  Scattered debris in the stomach. No free air or free fluid. Moderate nonspecific presacral and perirectal fat stranding, slightly increased from previous. Vascular/Lymphatic: Diffuse vascular calcifications identified. Once again there is an infrarenal abdominal aortic aneurysm measuring 3.3 x 3.1 cm. Areas of severe stenosis suggested along the common iliac vessels. Please correlate with particular symptoms and further workup when appropriate. No specific abnormal lymph node enlargement identified in the abdomen and pelvis. Reproductive: Status post hysterectomy. No adnexal masses. Other: No free air or free fluid. Musculoskeletal: Curvature of the spine with degenerative changes. There is also fixation hardware along the lumbar spine. IMPRESSION: Once again evidence of presumed hematoma in the bladder dependently. There is a small area of nodular enhancement along the wall along the left side of the bladder base measuring 7 mm. Intrinsic bladder lesion is possible recommend further workup when appropriate. Bilateral nonobstructing renal stones. Also new small punctate calcification in the bladder adjacent to the catheter of the Foley. This could represent a small bladder stone which was not clearly seen previously. 10 mm cystic lesion along the body of the pancreas. Based on size and appearance would recommend additional evaluation in 1 year. This could be performed with MRCP. Fatty liver infiltration. Diffuse vascular calcifications identified with potential significant stenosis along the common iliac arteries. Please correlate with any particular symptoms. In addition there is a 3.3 cm abdominal aortic aneurysm. Recommend follow-up ultrasound every 3 years. This recommendation follows ACR consensus guidelines: White Paper of the ACR Incidental Findings Committee II on Vascular Findings. J Am Coll Radiol 2013; 10:789-794. Electronically Signed   By: Karen Kays M.D.   On: 01/12/2023 12:43     Anti-infectives: Anti-infectives (From admission, onward)    Start     Dose/Rate Route Frequency Ordered Stop   01/12/23 1800  cefTRIAXone (ROCEPHIN) 1 g in sodium chloride 0.9 % 100 mL IVPB        1 g 200 mL/hr over 30 Minutes Intravenous Every 24 hours 01/12/23 0818 01/12/23 1900   01/08/23 1800  cefTRIAXone (ROCEPHIN) 1 g in sodium chloride 0.9 % 100 mL IVPB  Status:  Discontinued        1 g 200 mL/hr over 30 Minutes Intravenous Every 24 hours 01/08/23 1656 01/12/23 0818       Assessment/Plan:  Proceed as planned with cysto, retrogrades, possilbe TURBT, Possilbe stenting. Main goal is to r/o bladder neoplasm (or recurrent endometrial CA) v. Radiation cystitis.     Loletta Parish. 01/14/2023

## 2023-01-14 NOTE — Progress Notes (Signed)
PROGRESS NOTE  Diana Kennedy  ZOX:[id number] DOB: 1956-05-16 DOA: 01/07/2023 PCP: Coralee Rud, PA-C   Brief Narrative:  Patient is a 67 year old female with history of endometrial cancer status post chemo/radiation, nephrolithiasis, diabetes type 2, hypothyroidism who presented here with complaint of left lower quadrant pain, blood in the urine.  On presentation, her hemoglobin was 6.6.  CT abdomen/pelvis showed layering hyperdensity in the right posterolateral bladder, 12 mm calculus in the right proximal collecting system.  Urology consulted for gross hematuria.  Plan for cystoscopy today.  Currently on CBI  Assessment & Plan:  Principal Problem:   Symptomatic anemia Active Problems:   Hematuria   Abdominal pain   Elevated MCV   Uncontrolled type 2 diabetes mellitus with hyperglycemia, without long-term current use of insulin   Acquired hypothyroidism   GERD without esophagitis   Obesity (BMI 30-39.9)   Acute blood loss anemia   Acute blood loss anemia secondary to gross hematuria: Presented with red urine.  I and O cath performed on 4/13 drained 600 cc of pure blood.  CBI initiated. Status post total of 4 units of PRBC transfusion.  Given IV iron infusion. Continue to monitor hemoglobin.  Currently stable in the 7-8.  Urine is still pink. Urology following.   Bladder spasms improved with  tamsulosin, ditropan CT abd/pelvis done for follow up showed presumed hematoma in the bladder, small area of nodular enhancement along the wall along the left side of the bladder base measuring 7 mm,bilateral nonobstructing renal stones.  Urology planning for cystoscopy today.   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  Also started on empiric ceftriaxone.  Urine culture showed multiple sepsis.  Completed  5 days course of antibiotics today.  History of endometrial cancer: Status post hysterectomy on 2020, had pelvic recurrence, treated with radiation.  Will recommend to follow-up  with oncology as an outpatient.  Follows with Dr. Janann August  at [facility]  Cystic lesion on the body of pancreas: Incidentally noted on CT abdomen/pelvis which showed 10 mm cystic lesion along the body of the pancreas.We recommend follow-up with MRCP in a year.  Abdominal aortic aneurysm: 3.3 cm abdominal aortic aneurysm seem. Recommend follow-up ultrasound every 3years.   Thrombocytopenia: Mild.  Continue to monitor.  Hypokalemia: Supplemented and corrected  Vitamin B12 deficiency: Has macrocytosis.  Vitamin B12 of 145.  Given a dose of subcutaneous vitamin B12 injection.  Continue oral supplementation  Diabetes type 2 with hyperglycemia: Continue current insulin regimen.  Monitor blood sugars.  A1c of 7.5 as per 4/13  Hypothyroidism: Continue Synthyroid  History of GERD: Continue Protonix  Constipation: Continue bowel regimen  Obesity: BMI 32.4          DVT prophylaxis:SCD's Start: 01/13/23 1510 SCDs Start: 01/08/23 0300     Code Status: Full Code  Family Communication: Called daughter Crystal on phone on 4/19  Patient status:Inpatient  Patient is from :Home  Anticipated discharge WJ:XBJY  Estimated DC date:1-2 days, needs clearance from urology   Consultants: Urology  Procedures: None  Antimicrobials:  Anti-infectives (From admission, onward)    Start     Dose/Rate Route Frequency Ordered Stop   01/12/23 1800  cefTRIAXone (ROCEPHIN) 1 g in sodium chloride 0.9 % 100 mL IVPB        1 g 200 mL/hr over 30 Minutes Intravenous Every 24 hours 01/12/23 0818 01/12/23 1900   01/08/23 1800  cefTRIAXone (ROCEPHIN) 1 g in sodium chloride 0.9 % 100 mL IVPB  Status:  Discontinued  1 g 200 mL/hr over 30 Minutes Intravenous Every 24 hours 01/08/23 1656 01/12/23 0818       Subjective: Patient seen and examined at bedside today.  She looks more comfortable today.  Bladder spasms better after starting on tamsulosin/oxybutynin.  Urine is clearing  up.   Objective: Vitals:   01/13/23 0850 01/13/23 1207 01/13/23 2147 01/14/23 0515  BP: 114/62 (!) 117/58 (!) 110/57 (!) 103/54  Pulse: 76 71 85 78  Resp: 16 17 17 18   Temp: 98.1 F (36.7 C) 98.1 F (36.7 C) 98 F (36.7 C) 97.6 F (36.4 C)  TempSrc: Oral Oral Oral Oral  SpO2: 97% 97% 96% 97%  Weight:      Height:        Intake/Output Summary (Last 24 hours) at 01/14/2023 1153 Last data filed at 01/14/2023 0824 Gross per 24 hour  Intake 40981 ml  Output 9525 ml  Net 2955 ml   Filed Weights   01/07/23 1844  Weight: 88.5 kg    Examination:  General exam: Overall comfortable, not in distress HEENT: PERRL Respiratory system:  no wheezes or crackles  Cardiovascular system: S1 & S2 heard, RRR.  Gastrointestinal system: Abdomen is mildly distended, soft and nontender. Central nervous system: Alert and oriented Extremities: No edema, no clubbing ,no cyanosis Skin: No rashes, no ulcers,no icterus GU: CBI with pink urine    Data Reviewed: I have personally reviewed following labs and imaging studies  CBC: Recent Labs  Lab 01/07/23 2125 01/08/23 0912 01/10/23 0439 01/11/23 0558 01/12/23 0112 01/13/23 0329 01/14/23 0420  WBC 9.1   < > 5.0 4.8 4.9 5.2 5.8  NEUTROABS 8.2*  --   --   --   --   --   --   HGB 6.6*   < > 7.7* 8.6* 8.3* 7.5* 7.5*  HCT 22.0*   < > 25.3* 27.8* 27.0* 24.5* 24.3*  MCV 102.3*   < > 98.1 96.5 97.5 98.8 98.8  PLT 173   < > 120* 100* 116* 118* 112*   < > = values in this interval not displayed.   Basic Metabolic Panel: Recent Labs  Lab 01/08/23 0912 01/08/23 1756 01/09/23 0448 01/10/23 0439 01/11/23 0558 01/12/23 0112 01/14/23 0420  NA 135   < > 139 137 139 138 136  K 3.3*   < > 4.0 3.8 4.0 4.2 3.9  CL 103   < > 105 105 106 103 99  CO2 25   < > 26 25 27 27 29   GLUCOSE 177*   < > 202* 170* 136* 167* 162*  BUN 25*   < > 21 19 16 17 15   CREATININE 1.10*   < > 0.90 0.79 0.68 0.80 0.75  CALCIUM 8.5*   < > 8.1* 7.7* 8.1* 8.2* 8.6*  MG  1.9  --   --   --   --   --   --   PHOS 4.9*  --   --   --   --   --   --    < > = values in this interval not displayed.     Recent Results (from the past 240 hour(s))  Urine Culture (for pregnant, neutropenic or urologic patients or patients with an indwelling urinary catheter)     Status: Abnormal   Collection Time: 01/07/23  6:47 PM   Specimen: Urine, Clean Catch  Result Value Ref Range Status   Specimen Description   Final    URINE, CLEAN CATCH Performed  at [facility], [address], [place], [place] [address]    Special Requests   Final    NONE Performed at [facility], [address], [place], [place] [address]    Culture MULTIPLE SPECIES PRESENT, SUGGEST RECOLLECTION (A)  Final   Report Status 01/10/2023 FINAL  Final  MRSA Next Gen by PCR, Nasal     Status: None   Collection Time: 01/09/23  4:37 AM   Specimen: Nasal Mucosa; Nasal Swab  Result Value Ref Range Status   MRSA by PCR Next Gen NOT DETECTED NOT DETECTED Final    Comment: (NOTE) The GeneXpert MRSA Assay (FDA approved for NASAL specimens only), is one component of a comprehensive MRSA colonization surveillance program. It is not intended to diagnose MRSA infection nor to guide or monitor treatment for MRSA infections. Test performance is not FDA approved in patients less than 66 years old. Performed at [facility], [address] [place], [place] [address]      Radiology Studies: CT ABDOMEN PELVIS W WO CONTRAST  Result Date: 01/12/2023 CLINICAL DATA:  Gross hematuria. History of endometrioid adenocarcinoma EXAM: CT ABDOMEN AND PELVIS WITHOUT AND WITH CONTRAST TECHNIQUE: Multidetector CT imaging of the abdomen and pelvis was performed following the standard protocol before and following the bolus administration of intravenous contrast. RADIATION DOSE REDUCTION: This exam was performed according to the departmental dose-optimization program which  includes automated exposure control, adjustment of the mA and/or kV according to patient size and/or use of iterative reconstruction technique. CONTRAST:  OMNIPAQUE IOHEXOL 350 MG/ML SOLN COMPARISON:  Noncontrast CT 01/07/2023 and older FINDINGS: Lower chest: Status post median sternotomy. Coronary artery calcifications are seen. There is some basilar atelectasis. Heart is slightly enlarged. Breathing motion. Hepatobiliary: Diffuse fatty liver infiltration identified with some focal fat deposition additionally along the posterior aspect of the segment 4. Patent portal vein. The gallbladder is nondilated. Pancreas: small cystic lesion along the body/tail of the pancreas measuring 10 mm. Best seen on series 10, image 40 of the coronal data set. Recommend continued surveillance. Spleen: Normal in size without focal abnormality. Adrenals/Urinary Tract: The adrenal glands are preserved. There is moderate atrophy of the right kidney. Once again there is a 12 mm stone in the right renal pelvis. Additional punctate foci again noted as on prior. Left kidney also has some lower pole punctate foci. No ureteral stones. No enhancing renal mass. Foley catheter in bladder with once again dependent ill-defined high density which may represent hemorrhage. There is punctate calcification as well in the bladder which is new on series 4, image 83. Possible stone. There is a questionable area of nodular enhancement along left side of the bladder base best seen on series 4 image 88 and series 10 image 49 measuring 7 mm. A subtle bladder lesion is possible. Further workup when clinically appropriate. Stomach/Bowel: Moderate diffuse colonic stool. Normal appendix. Scattered debris in the stomach. No free air or free fluid. Moderate nonspecific presacral and perirectal fat stranding, slightly increased from previous. Vascular/Lymphatic: Diffuse vascular calcifications identified. Once again there is an infrarenal abdominal aortic  aneurysm measuring 3.3 x 3.1 cm. Areas of severe stenosis suggested along the common iliac vessels. Please correlate with particular symptoms and further workup when appropriate. No specific abnormal lymph node enlargement identified in the abdomen and pelvis. Reproductive: Status post hysterectomy. No adnexal masses. Other: No free air or free fluid. Musculoskeletal: Curvature of the spine with degenerative changes. There is also fixation hardware  along the lumbar spine. IMPRESSION: Once again evidence of presumed hematoma in the bladder dependently. There is a small area of nodular enhancement along the wall along the left side of the bladder base measuring 7 mm. Intrinsic bladder lesion is possible recommend further workup when appropriate. Bilateral nonobstructing renal stones. Also new small punctate calcification in the bladder adjacent to the catheter of the Foley. This could represent a small bladder stone which was not clearly seen previously. 10 mm cystic lesion along the body of the pancreas. Based on size and appearance would recommend additional evaluation in 1 year. This could be performed with MRCP. Fatty liver infiltration. Diffuse vascular calcifications identified with potential significant stenosis along the common iliac arteries. Please correlate with any particular symptoms. In addition there is a 3.3 cm abdominal aortic aneurysm. Recommend follow-up ultrasound every 3 years. This recommendation follows ACR consensus guidelines: White Paper of the ACR Incidental Findings Committee II on Vascular Findings. J Am Coll Radiol 2013; 10:789-794. Electronically Signed   By: Karen Kays M.D.   On: 01/12/2023 12:43    Scheduled Meds:  bisacodyl  10 mg Rectal Once   Chlorhexidine Gluconate Cloth  6 each Topical Daily   vitamin B-12  1,000 mcg Oral Daily   insulin aspart  0-15 Units Subcutaneous TID WC   insulin aspart  2 Units Subcutaneous TID WC   insulin glargine-yfgn  6 Units Subcutaneous QHS    levothyroxine  125 mcg Oral QAC breakfast   pantoprazole  40 mg Oral Daily   polyethylene glycol  17 g Oral Daily   pregabalin  200 mg Oral TID   senna-docusate  1 tablet Oral BID   sodium chloride flush  10-40 mL Intracatheter Q12H   tamsulosin  0.4 mg Oral Daily   Continuous Infusions:  sodium chloride irrigation       LOS: 6 days   Burnadette Pop, MD [facility] P4/19/2024, 11:53 AM

## 2023-01-15 DIAGNOSIS — D649 Anemia, unspecified: Secondary | ICD-10-CM | POA: Diagnosis not present

## 2023-01-15 LAB — CBC
HCT: 25.2 % — ABNORMAL LOW (ref 36.0–46.0)
Hemoglobin: 7.8 g/dL — ABNORMAL LOW (ref 12.0–15.0)
MCH: 30.6 pg (ref 26.0–34.0)
MCHC: 31 g/dL (ref 30.0–36.0)
MCV: 98.8 fL (ref 80.0–100.0)
Platelets: 152 10*3/uL (ref 150–400)
RBC: 2.55 MIL/uL — ABNORMAL LOW (ref 3.87–5.11)
RDW: 17.1 % — ABNORMAL HIGH (ref 11.5–15.5)
WBC: 8.1 10*3/uL (ref 4.0–10.5)
nRBC: 0.4 % — ABNORMAL HIGH (ref 0.0–0.2)

## 2023-01-15 LAB — GLUCOSE, CAPILLARY
Glucose-Capillary: 123 mg/dL — ABNORMAL HIGH (ref 70–99)
Glucose-Capillary: 192 mg/dL — ABNORMAL HIGH (ref 70–99)
Glucose-Capillary: 131 mg/dL — ABNORMAL HIGH (ref 70–99)

## 2023-01-15 NOTE — Progress Notes (Signed)
Mobility Specialist - Progress Note   01/15/23 1141  Mobility  Activity Ambulated independently in hallway  Level of Assistance Standby assist, set-up cues, supervision of patient - no hands on  Assistive Device None  Distance Ambulated (ft) 210 ft  Activity Response Tolerated well  Mobility Referral Yes  $Mobility charge 1 Mobility   Pt received in bed and agreeable to mobility. No complaints during session. Pt to bed after session with all needs met.     [facility]

## 2023-01-15 NOTE — Progress Notes (Signed)
Urology Progress Note   1 Day Post-Op s/p TURBT, right ureteral stent placement  Subjective: No acute events overnight.  Patient afebrile and stable.  Pain controlled.  Adequate urine output via Foley which is clear yellow.  Reports bladder spasms.  Hemoglobin 7.8 and stable.  Objective: Vital signs in last 24 hours: Temp:  [97.4 F (36.3 C)-98.3 F (36.8 C)] 98 F (36.7 C) (04/20 0508) Pulse Rate:  [73-96] 77 (04/20 0508) Resp:  [10-17] 12 (04/20 0508) BP: (91-136)/(48-72) 108/68 (04/20 0508) SpO2:  [89 %-99 %] 96 % (04/20 0508) Weight:  [88.5 kg] 88.5 kg (04/19 1315)  Intake/Output from previous day: 04/19 0701 - 04/20 0700 In: 6750 [I.V.:700; IV Piggyback:50] Out: 2050 [Urine:2050] Intake/Output this shift: Total I/O In: -  Out: 150 [Urine:150]  Physical Exam:  General: Alert and oriented CV: Regular rate Lungs: Normal work of breathing on room air Abdomen: Soft, nondistended, nontender GU: Foley in place draining clear yellow urine Ext: NT, No erythema  Lab Results: Recent Labs    01/13/23 0329 01/14/23 0420 01/15/23 0300  HGB 7.5* 7.5* 7.8*  HCT 24.5* 24.3* 25.2*   BMET Recent Labs    01/14/23 0420  NA 136  K 3.9  CL 99  CO2 29  GLUCOSE 162*  BUN 15  CREATININE 0.75  CALCIUM 8.6*     Studies/Results: DG C-Arm 1-60 Min-No Report  Result Date: 01/14/2023 Fluoroscopy was utilized by the requesting physician.  No radiographic interpretation.    Assessment/Plan:  67 y.o. female with hematuria s/p TURBT, right ureteral stent placement on 01/14/2023 for small bladder tumor and incidental stone.  -Continue Foley catheter to gravity drainage. -Surgical pathology still in process  Please page urology with any questions or concerns   LOS: 7 days   Diana Kennedy 01/15/2023, 12:17 PM

## 2023-01-15 NOTE — Progress Notes (Signed)
PROGRESS NOTE  Diana Kennedy  RUE:[id number] DOB: [birth year] DOA: 01/07/2023 PCP: Coralee Rud, PA-C   Brief Narrative:  Patient is a 67 year old female with history of endometrial cancer status post chemo/radiation, nephrolithiasis, diabetes type 2, hypothyroidism who presented here with complaint of left lower quadrant pain, blood in the urine.  On presentation, her hemoglobin was 6.6.  CT abdomen/pelvis showed layering hyperdensity in the right posterolateral bladder, 12 mm calculus in the right proximal collecting system.  Urology consulted for gross hematuria.  Underwent cystoscopy on 4/19 with transurethral resection of bladder tumor, right ureteral stent placement  Assessment & Plan:  Principal Problem:   Symptomatic anemia Active Problems:   Hematuria   Abdominal pain   Elevated MCV   Uncontrolled type 2 diabetes mellitus with hyperglycemia, without long-term current use of insulin   Acquired hypothyroidism   GERD without esophagitis   Obesity (BMI 30-39.9)   Acute blood loss anemia   Acute blood loss anemia secondary to gross hematuria: Presented with red urine.  I and O cath performed on 4/13 drained 600 cc of pure blood.  CBI initiated. Status post total of 4 units of PRBC transfusion.  Given IV iron infusion. CT abd/pelvis done for follow up showed presumed hematoma in the bladder, small area of nodular enhancement along the wall along the left side of the bladder base measuring 7 mm,bilateral nonobstructing renal stones.   Underwent cystoscopy on 4/19 with transurethral resection of bladder tumor, right ureteral stent placement. Continue to monitor hemoglobin.  Currently stable in the 7-8.  Urine has cleared. Urology following.   Bladder spasms improved with  tamsulosin, ditropan.  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  Also started on empiric ceftriaxone.  Urine culture showed multiple sepsis.  Completed  5 days  course of antibiotics today.  Urine has cleared now.  History of endometrial cancer: Status post hysterectomy on 2020, had pelvic recurrence, treated with radiation.  Will recommend to follow-up with oncology as an outpatient.  Follows with Dr. Janann August  at [facility].  Has a Chemo-Port on the right chest  Cystic lesion on the body of pancreas: Incidentally noted on CT abdomen/pelvis which showed 10 mm cystic lesion along the body of the pancreas.We recommend follow-up with MRCP in a year.  Abdominal aortic aneurysm: 3.3 cm abdominal aortic aneurysm seem. Recommend follow-up ultrasound every 3years.   Thrombocytopenia: Mild.  Continue to monitor.  Hypokalemia: Supplemented and corrected  Vitamin B12 deficiency: Has macrocytosis.  Vitamin B12 of 145.  Given a dose of subcutaneous vitamin B12 injection.  Continue oral supplementation  Diabetes type 2 with hyperglycemia: Continue current insulin regimen.  Monitor blood sugars.  A1c of 7.5 as per 4/13  Hypothyroidism: Continue Synthyroid  History of GERD: Continue Protonix  Constipation: Continue bowel regimen  Obesity: BMI 32.4          DVT prophylaxis:SCDs Start: 01/08/23 0300     Code Status: Full Code  Family Communication: Called daughter Crystal on phone on 4/19  Patient status:Inpatient  Patient is from :Home  Anticipated discharge JY:NWGN  Estimated DC date:1-2 days, needs clearance from urology   Consultants: Urology  Procedures: Cystoscopy Antimicrobials:  Anti-infectives (From admission, onward)    Start     Dose/Rate Route Frequency Ordered Stop   01/12/23 1800  cefTRIAXone (ROCEPHIN) 1 g in sodium chloride 0.9 % 100 mL IVPB        1 g 200 mL/hr over 30 Minutes  Intravenous Every 24 hours 01/12/23 0818 01/12/23 1900   01/08/23 1800  cefTRIAXone (ROCEPHIN) 1 g in sodium chloride 0.9 % 100 mL IVPB  Status:  Discontinued        1 g 200 mL/hr over 30 Minutes Intravenous Every 24 hours 01/08/23 1656  01/12/23 0818       Subjective: Patient seen and examined at bedside today.  Hemodynamically stable comfortable.  Denies any abdominal pain.  CBI has been discontinued.  Foley draining clear urine.  Still having some bladder discomfort because of spasm  Objective: Vitals:   01/14/23 1645 01/14/23 1708 01/14/23 2059 01/15/23 0508  BP: (!) 113/54 (!) 133/59 136/72 108/68  Pulse: 73 75 96 77  Resp: 12   12  Temp: 97.6 F (36.4 C) 97.6 F (36.4 C) 98.3 F (36.8 C) 98 F (36.7 C)  TempSrc:  Oral Oral Oral  SpO2: 96% 99% 91% 96%  Weight:      Height:        Intake/Output Summary (Last 24 hours) at 01/15/2023 1146 Last data filed at 01/15/2023 0800 Gross per 24 hour  Intake 750 ml  Output 2200 ml  Net -1450 ml   Filed Weights   01/07/23 1844 01/14/23 1315  Weight: 88.5 kg 88.5 kg    Examination:  General exam: Overall comfortable, not in distress HEENT: PERRL Respiratory system:  no wheezes or crackles  Cardiovascular system: S1 & S2 heard, RRR.  Gastrointestinal system: Abdomen is nondistended, soft and nontender. Central nervous system: Alert and oriented Extremities: No edema, no clubbing ,no cyanosis Skin: No rashes, no ulcers,no icterus   GU: foley with clear urine  Data Reviewed: I have personally reviewed following labs and imaging studies  CBC: Recent Labs  Lab 01/11/23 0558 01/12/23 0112 01/13/23 0329 01/14/23 0420 01/15/23 0300  WBC 4.8 4.9 5.2 5.8 8.1  HGB 8.6* 8.3* 7.5* 7.5* 7.8*  HCT 27.8* 27.0* 24.5* 24.3* 25.2*  MCV 96.5 97.5 98.8 98.8 98.8  PLT 100* 116* 118* 112* 152   Basic Metabolic Panel: Recent Labs  Lab 01/09/23 0448 01/10/23 0439 01/11/23 0558 01/12/23 0112 01/14/23 0420  NA 139 137 139 138 136  K 4.0 3.8 4.0 4.2 3.9  CL 105 105 106 103 99  CO2 GLUCOSE 202* 170* 136* 167* 162*  BUN CREATININE 0.90 0.79 0.68 0.80 0.75  CALCIUM 8.1* 7.7* 8.1* 8.2* 8.6*     Recent Results (from the past 240  hour(s))  Urine Culture (for pregnant, neutropenic or urologic patients or patients with an indwelling urinary catheter)     Status: Abnormal   Collection Time: 01/07/23  6:47 PM   Specimen: Urine, Clean Catch  Result Value Ref Range Status   Specimen Description   Final    URINE, CLEAN CATCH Performed at [facility], [address], [place], [place] [address]    Special Requests   Final    NONE Performed at [facility], [address], [place], [place] [address]    Culture MULTIPLE SPECIES PRESENT, SUGGEST RECOLLECTION (A)  Final   Report Status 01/10/2023 FINAL  Final  MRSA Next Gen by PCR, Nasal     Status: None   Collection Time: 01/09/23  4:37 AM   Specimen: Nasal Mucosa; Nasal Swab  Result Value Ref Range Status   MRSA by PCR Next Gen NOT DETECTED NOT DETECTED Final    Comment: (NOTE) The GeneXpert MRSA Assay (  FDA approved for NASAL specimens only), is one component of a comprehensive MRSA colonization surveillance program. It is not intended to diagnose MRSA infection nor to guide or monitor treatment for MRSA infections. Test performance is not FDA approved in patients less than 58 years old. Performed at [facility], [address] [place], [place] [address]      Radiology Studies: DG C-Arm 1-60 Min-No Report  Result Date: 01/14/2023 Fluoroscopy was utilized by the requesting physician.  No radiographic interpretation.    Scheduled Meds:  Chlorhexidine Gluconate Cloth  6 each Topical Daily   vitamin B-12  1,000 mcg Oral Daily   insulin aspart  0-15 Units Subcutaneous TID WC   insulin aspart  2 Units Subcutaneous TID WC   insulin glargine-yfgn  6 Units Subcutaneous QHS   levothyroxine  125 mcg Oral QAC breakfast   pantoprazole  40 mg Oral Daily   polyethylene glycol  17 g Oral Daily   pregabalin  200 mg Oral TID   senna-docusate  1 tablet Oral BID   sodium chloride flush  10-40 mL Intracatheter Q12H    tamsulosin  0.4 mg Oral Daily   Continuous Infusions:  sodium chloride irrigation       LOS: 7 days   Burnadette Pop, MD [facility] P4/20/2024, 11:46 AM

## 2023-01-16 DIAGNOSIS — D649 Anemia, unspecified: Secondary | ICD-10-CM | POA: Diagnosis not present

## 2023-01-16 LAB — BASIC METABOLIC PANEL
Anion gap: 8 (ref 5–15)
BUN: 17 mg/dL (ref 8–23)
CO2: 30 mmol/L (ref 22–32)
Calcium: 8.4 mg/dL — ABNORMAL LOW (ref 8.9–10.3)
Chloride: 99 mmol/L (ref 98–111)
Creatinine, Ser: 0.83 mg/dL (ref 0.44–1.00)
GFR, Estimated: >60 mL/min (ref >60)
Glucose, Bld: 130 mg/dL — ABNORMAL HIGH (ref 70–99)
Potassium: 3.6 mmol/L (ref 3.5–5.1)
Sodium: 137 mmol/L (ref 135–145)

## 2023-01-16 LAB — CBC
HCT: 23.7 % — ABNORMAL LOW (ref 36.0–46.0)
Hemoglobin: 7.1 g/dL — ABNORMAL LOW (ref 12.0–15.0)
MCH: 30.5 pg (ref 26.0–34.0)
MCHC: 30 g/dL (ref 30.0–36.0)
MCV: 101.7 fL — ABNORMAL HIGH (ref 80.0–100.0)
Platelets: 132 10*3/uL — ABNORMAL LOW (ref 150–400)
RBC: 2.33 MIL/uL — ABNORMAL LOW (ref 3.87–5.11)
RDW: 17.4 % — ABNORMAL HIGH (ref 11.5–15.5)
WBC: 5.5 10*3/uL (ref 4.0–10.5)
nRBC: 0 % (ref 0.0–0.2)

## 2023-01-16 LAB — TYPE AND SCREEN: Antibody Screen: NEGATIVE

## 2023-01-16 LAB — GLUCOSE, CAPILLARY
Glucose-Capillary: 122 mg/dL — ABNORMAL HIGH (ref 70–99)
Glucose-Capillary: 154 mg/dL — ABNORMAL HIGH (ref 70–99)
Glucose-Capillary: 173 mg/dL — ABNORMAL HIGH (ref 70–99)

## 2023-01-16 LAB — PREPARE RBC (CROSSMATCH)

## 2023-01-16 MED ORDER — FERROUS SULFATE 325 (65 FE) MG PO TABS: 325.0000 mg | ORAL_TABLET | Freq: Every day | ORAL | Status: DC

## 2023-01-16 MED ORDER — SODIUM CHLORIDE 0.9% IV SOLUTION: Freq: Once | INTRAVENOUS | Status: AC

## 2023-01-16 MED ORDER — POLYETHYLENE GLYCOL 3350 17 G PO PACK: 17.0000 g | PACK | Freq: Every day | ORAL | 0 refills | Status: AC

## 2023-01-16 MED ORDER — FERROUS SULFATE 325 (65 FE) MG PO TABS: 325.0000 mg | ORAL_TABLET | Freq: Every day | ORAL | 1 refills | Status: DC

## 2023-01-16 MED ORDER — FERROUS SULFATE 325 (65 FE) MG PO TABS
325.0000 mg | ORAL_TABLET | Freq: Every day | ORAL | Status: DC
  Administered 2023-01-16: 325 mg via ORAL
  Filled 2023-01-16: qty 1

## 2023-01-16 MED ORDER — HEPARIN SOD (PORK) LOCK FLUSH 100 UNIT/ML IV SOLN
500.0000 [IU] | INTRAVENOUS | Status: AC | PRN
  Administered 2023-01-16: 500 [IU]

## 2023-01-16 MED ORDER — OXYBUTYNIN CHLORIDE 5 MG PO TABS: 5.0000 mg | ORAL_TABLET | Freq: Three times a day (TID) | ORAL | 0 refills | Status: DC | PRN

## 2023-01-16 MED ORDER — CYANOCOBALAMIN 1000 MCG PO TABS: 1000.0000 ug | ORAL_TABLET | Freq: Every day | ORAL | 1 refills | Status: DC

## 2023-01-16 NOTE — Discharge Instructions (Signed)
Be sure to follow-up with Dr. Berneice Heinrich to plan stent/stone removal.  [facility] specialist-[phone]

## 2023-01-16 NOTE — Discharge Summary (Signed)
Physician Discharge Summary  Diana Kennedy ZOX:[id number] DOB: 09/22/1956 DOA: 01/07/2023  PCP: Coralee Rud, PA-C  Admit date: 01/07/2023 Discharge date: 01/16/2023  Admitted From: Home Disposition:  Home  Discharge Condition:Stable CODE STATUS:FULL Diet recommendation: Carb consistent  Brief/Interim Summary: Patient is a 67 year old female with history of endometrial cancer status post chemo/radiation, nephrolithiasis, diabetes type 2, hypothyroidism who presented here with complaint of left lower quadrant pain, blood in the urine.  On presentation, her hemoglobin was 6.6.  CT abdomen/pelvis showed layering hyperdensity in the right posterolateral bladder, 12 mm calculus in the right proximal collecting system.  Urology consulted for gross hematuria.  Underwent cystoscopy on 4/19 with transurethral resection of bladder tumor, right ureteral stent placement.  Pathology is still pending.  She will be followed by urology as an outpatient.  Medically stable for discharge home today.  Following problems were addressed during the hospitalization:  Acute blood loss anemia secondary to gross hematuria: Presented with red urine.  I and O cath performed on 4/13 drained 600 cc of pure blood.  CBI initiated. Status post total of 5 units of PRBC transfusion during this hospitalization.  Given IV iron infusion. Hemoglobin of 7.1 today, given another unit before discharge today to home CT abd/pelvis done for follow up showed presumed hematoma in the bladder, small area of nodular enhancement along the wall along the left side of the bladder base measuring 7 mm,bilateral nonobstructing renal stones.   Underwent cystoscopy on 4/19 with transurethral resection of bladder tumor, right ureteral stent placement. Urine has cleared.Foley removed. Bladder spasms improved with  ditropan.  Urology recommended to follow-up as an outpatient.  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  Also started on empiric ceftriaxone.  Urine culture showed multiple sepsis.  Completed  5 days course of antibiotics today.  Urine has cleared now.   History of endometrial cancer: Status post hysterectomy on 2020, had pelvic recurrence, treated with radiation.  Will recommend to follow-up with oncology as an outpatient.  Follows with Dr. Janann August  at [facility].  Has a Chemo-Port on the right chest   Cystic lesion on the body of pancreas: Incidentally noted on CT abdomen/pelvis which showed 10 mm cystic lesion along the body of the pancreas.We recommend follow-up with MRCP in a year.   Abdominal aortic aneurysm: 3.3 cm abdominal aortic aneurysm seem. Recommend follow-up ultrasound every 3years.    Thrombocytopenia: Mild.  Continue to monitor as an outpatient.   Hypokalemia: Supplemented and corrected   Vitamin B12 deficiency: Has macrocytosis.  Vitamin B12 of 145.  Given a dose of subcutaneous vitamin B12 injection.  Continue oral supplementation   Diabetes type 2 with hyperglycemia: Continue home regimen.  Monitor blood sugars.  A1c of 7.5 as per 4/13   Hypothyroidism: Continue Synthyroid   History of GERD: Continue Protonix   Constipation: Continue bowel regimen   Obesity: BMI 32.4  Discharge Diagnoses:  Principal Problem:   Symptomatic anemia Active Problems:   Hematuria   Abdominal pain   Elevated MCV   Uncontrolled type 2 diabetes mellitus with hyperglycemia, without long-term current use of insulin   Acquired hypothyroidism   GERD without esophagitis   Obesity (BMI 30-39.9)   Acute blood loss anemia    Discharge Instructions  Discharge Instructions     Diet Carb Modified   Complete by: As directed    Discharge instructions   Complete by: As directed  1)Please take prescribed medications as instructed 2)Follow up with your PCP in a week.  Do a CBC test to check your hemoglobin during the follow-up   5)Follow up with urology as an outpatient in a week.  Name and number the provider has been attached   Increase activity slowly   Complete by: As directed       Allergies as of 01/16/2023   No Known Allergies      Medication List     TAKE these medications    cyanocobalamin 1000 MCG tablet Take 1 tablet (1,000 mcg total) by mouth daily. Start taking on: January 17, 2023   ferrous sulfate 325 (65 FE) MG tablet Take 1 tablet (325 mg total) by mouth daily with breakfast. Start taking on: January 17, 2023   hydrOXYzine 25 MG tablet Commonly known as: ATARAX Take 25 mg by mouth every 6 (six) hours as needed for anxiety.   Janumet XR 50-1000 MG Tb24 Generic drug: SitaGLIPtin-MetFORMIN HCl Take 1 tablet by mouth daily.   levothyroxine 125 MCG tablet Commonly known as: SYNTHROID Take 125 mcg by mouth daily before breakfast.   omeprazole 40 MG capsule Commonly known as: PRILOSEC Take 40 mg by mouth daily as needed (indigestion).   oxybutynin 5 MG tablet Commonly known as: DITROPAN Take 1 tablet (5 mg total) by mouth every 8 (eight) hours as needed for bladder spasms.   oxyCODONE 15 MG immediate release tablet Commonly known as: ROXICODONE Take 15 mg by mouth every 6 (six) hours as needed for pain.   polyethylene glycol 17 g packet Commonly known as: MIRALAX / GLYCOLAX Take 17 g by mouth daily. Start taking on: January 17, 2023   pregabalin 200 MG capsule Commonly known as: LYRICA Take 200 mg by mouth 3 (three) times daily.   Xtampza ER 13.5 MG C12a Generic drug: oxyCODONE ER Take 1 capsule by mouth in the morning and at bedtime.        Follow-up Information     Sebastian Ache, MD. Schedule an appointment as soon as possible for a visit in 1 week(s).   Specialty: Urology Contact information: [address] [place] [address] [phone]         Coralee Rud, PA-C. Schedule an appointment as soon as possible for a visit in 1 week(s).   Specialty:  Cardiology Contact information: [facility]  [address] [place] [address] [phone]                No Known Allergies  Consultations: Urology   Procedures/Studies: DG C-Arm 1-60 Min-No Report  Result Date: 01/14/2023 Fluoroscopy was utilized by the requesting physician.  No radiographic interpretation.   CT ABDOMEN PELVIS W WO CONTRAST  Result Date: 01/12/2023 CLINICAL DATA:  Gross hematuria. History of endometrioid adenocarcinoma EXAM: CT ABDOMEN AND PELVIS WITHOUT AND WITH CONTRAST TECHNIQUE: Multidetector CT imaging of the abdomen and pelvis was performed following the standard protocol before and following the bolus administration of intravenous contrast. RADIATION DOSE REDUCTION: This exam was performed according to the departmental dose-optimization program which includes automated exposure control, adjustment of the mA and/or kV according to patient size and/or use of iterative reconstruction technique. CONTRAST:  OMNIPAQUE IOHEXOL 350 MG/ML SOLN COMPARISON:  Noncontrast CT 01/07/2023 and older FINDINGS: Lower chest: Status post median sternotomy. Coronary artery calcifications are seen. There is some basilar atelectasis. Heart is slightly enlarged. Breathing motion. Hepatobiliary: Diffuse fatty liver infiltration identified with some focal fat deposition additionally along  the posterior aspect of the segment 4. Patent portal vein. The gallbladder is nondilated. Pancreas: small cystic lesion along the body/tail of the pancreas measuring 10 mm. Best seen on series 10, image 40 of the coronal data set. Recommend continued surveillance. Spleen: Normal in size without focal abnormality. Adrenals/Urinary Tract: The adrenal glands are preserved. There is moderate atrophy of the right kidney. Once again there is a 12 mm stone in the right renal pelvis. Additional punctate foci again noted as on prior. Left kidney also has some lower pole punctate foci. No  ureteral stones. No enhancing renal mass. Foley catheter in bladder with once again dependent ill-defined high density which may represent hemorrhage. There is punctate calcification as well in the bladder which is new on series 4, image 83. Possible stone. There is a questionable area of nodular enhancement along left side of the bladder base best seen on series 4 image 88 and series 10 image 49 measuring 7 mm. A subtle bladder lesion is possible. Further workup when clinically appropriate. Stomach/Bowel: Moderate diffuse colonic stool. Normal appendix. Scattered debris in the stomach. No free air or free fluid. Moderate nonspecific presacral and perirectal fat stranding, slightly increased from previous. Vascular/Lymphatic: Diffuse vascular calcifications identified. Once again there is an infrarenal abdominal aortic aneurysm measuring 3.3 x 3.1 cm. Areas of severe stenosis suggested along the common iliac vessels. Please correlate with particular symptoms and further workup when appropriate. No specific abnormal lymph node enlargement identified in the abdomen and pelvis. Reproductive: Status post hysterectomy. No adnexal masses. Other: No free air or free fluid. Musculoskeletal: Curvature of the spine with degenerative changes. There is also fixation hardware along the lumbar spine. IMPRESSION: Once again evidence of presumed hematoma in the bladder dependently. There is a small area of nodular enhancement along the wall along the left side of the bladder base measuring 7 mm. Intrinsic bladder lesion is possible recommend further workup when appropriate. Bilateral nonobstructing renal stones. Also new small punctate calcification in the bladder adjacent to the catheter of the Foley. This could represent a small bladder stone which was not clearly seen previously. 10 mm cystic lesion along the body of the pancreas. Based on size and appearance would recommend additional evaluation in 1 year. This could be  performed with MRCP. Fatty liver infiltration. Diffuse vascular calcifications identified with potential significant stenosis along the common iliac arteries. Please correlate with any particular symptoms. In addition there is a 3.3 cm abdominal aortic aneurysm. Recommend follow-up ultrasound every 3 years. This recommendation follows ACR consensus guidelines: White Paper of the ACR Incidental Findings Committee II on Vascular Findings. J Am Coll Radiol 2013; 10:789-794. Electronically Signed   By: Karen Kays M.D.   On: 01/12/2023 12:43   DG CHEST PORT 1 VIEW  Result Date: 01/08/2023 CLINICAL DATA:  Cough EXAM: PORTABLE CHEST 1 VIEW COMPARISON:  09/25/2022 FINDINGS: Lungs are clear.  No pleural effusion or pneumothorax. The heart is normal in size. Postsurgical changes related to prior CABG. Right chest port terminates cavoatrial junction.  Median sternotomy. IMPRESSION: No acute cardiopulmonary disease. Electronically Signed   By: Charline Bills M.D.   On: 01/08/2023 21:10   CT Renal Stone Study  Result Date: 01/07/2023 CLINICAL DATA:  Left lower quadrant abdominal/flank pain, history of metastatic/recurrent endometrioid adenocarcinoma EXAM: CT ABDOMEN AND PELVIS WITHOUT CONTRAST TECHNIQUE: Multidetector CT imaging of the abdomen and pelvis was performed following the standard protocol without IV contrast. RADIATION DOSE REDUCTION: This exam was performed according to the departmental  dose-optimization program which includes automated exposure control, adjustment of the mA and/or kV according to patient size and/or use of iterative reconstruction technique. COMPARISON:  PET-CT dated 12/03/2022 FINDINGS: Lower chest: Mild scarring/atelectasis at the lung bases. Hepatobiliary: Mild hepatic steatosis focal fatty sparing along the gallbladder fossa. Layering gallbladder sludge (series 2/image 41), without associated inflammatory changes. No intrahepatic or extrahepatic duct dilatation. Pancreas: Within  normal limits. Spleen: Within normal limits Adrenals/Urinary Tract: Adrenal glands are within normal limits. Punctate nonobstructing bilateral renal calculi. Additional 12 mm calculus in the right proximal collecting system (coronal image 87). Mild fullness of the left renal collecting system without frank hydronephrosis. Layering hyperdensity in the right posterolateral bladder (series 2/image 82). Differential considerations include hemorrhage/debris (favored) versus urothelial soft tissue lesion/mass. Stomach/Bowel: Stomach is within normal limits. No evidence of bowel obstruction. Normal appendix (series 2/image 62). Mild to moderate left colonic stool burden. Vascular/Lymphatic: No evidence of abdominal aortic aneurysm. Atherosclerotic calcifications of the abdominal aorta and branch vessels. No suspicious abdominopelvic lymphadenopathy. Reproductive: Status post hysterectomy. Fiducial markers along the surgical bed. No adnexal masses. Other: No abdominopelvic ascites. Musculoskeletal: Status post PLIF at L4-5. Degenerative changes of the visualized thoracolumbar spine. Median sternotomy. IMPRESSION: Layering hyperdensity in the right posterolateral bladder. Differential considerations include hemorrhage/debris (favored) versus urothelial soft tissue lesion/mass. Consider cystoscopy for further evaluation. 12 mm calculus in the right proximal collecting system. Additional punctate nonobstructing bilateral renal calculi. Mild fullness of the left renal collecting system without frank hydronephrosis. Status post hysterectomy with fiducial markers along the surgical bed. No findings specific for recurrent or metastatic disease. Suspected mild constipation, which may account for the patient's left lower quadrant abdominal pain. Electronically Signed   By: Charline Bills M.D.   On: 01/07/2023 22:13      Subjective: Patient seen and examined at bedside today.  Hemodynamically stable for discharge.  Feels  comfortable.  Eager to go home.  We discussed about giving a unit  of blood before she goes home.  I also called her daughter Diana Kennedy and discussed about the discharge planning  Discharge Exam: Vitals:   01/16/23 0558 01/16/23 1231  BP:  93/61  Pulse: 68 93  Resp:  14  Temp:  98.2 F (36.8 C)  SpO2: 96% (!) 88%   Vitals:   01/16/23 0555 01/16/23 0556 01/16/23 0558 01/16/23 1231  BP:    93/61  Pulse: 74 71 68 93  Resp:    14  Temp:    98.2 F (36.8 C)  TempSrc:    Oral  SpO2: 90% 90% 96% (!) 88%  Weight:      Height:        General: Pt is alert, awake, not in acute distress Cardiovascular: RRR, S1/S2 +, no rubs, no gallops Respiratory: CTA bilaterally, no wheezing, no rhonchi Abdominal: Soft, NT, ND, bowel sounds + Extremities: no edema, no cyanosis    The results of significant diagnostics from this hospitalization (including imaging, microbiology, ancillary and laboratory) are listed below for reference.     Microbiology: Recent Results (from the past 240 hour(s))  Urine Culture (for pregnant, neutropenic or urologic patients or patients with an indwelling urinary catheter)     Status: Abnormal   Collection Time: 01/07/23  6:47 PM   Specimen: Urine, Clean Catch  Result Value Ref Range Status   Specimen Description   Final    URINE, CLEAN CATCH Performed at [facility], [address], [place], [place] [address]    Special Requests  Final    NONE Performed at [facility], [address], [place], [place] [address]    Culture MULTIPLE SPECIES PRESENT, SUGGEST RECOLLECTION (A)  Final   Report Status 01/10/2023 FINAL  Final  MRSA Next Gen by PCR, Nasal     Status: None   Collection Time: 01/09/23  4:37 AM   Specimen: Nasal Mucosa; Nasal Swab  Result Value Ref Range Status   MRSA by PCR Next Gen NOT DETECTED NOT DETECTED Final    Comment: (NOTE) The GeneXpert MRSA Assay (FDA approved for NASAL specimens only), is one  component of a comprehensive MRSA colonization surveillance program. It is not intended to diagnose MRSA infection nor to guide or monitor treatment for MRSA infections. Test performance is not FDA approved in patients less than 48 years old. Performed at [facility], [address] [place], [place] [address]      Labs: BNP (last 3 results) No results for input(s): "BNP" in the last 8760 hours. Basic Metabolic Panel: Recent Labs  Lab 01/10/23 0439 01/11/23 0558 01/12/23 0112 01/14/23 0420 01/16/23 0820  NA 137 139 138 136 137  K 3.8 4.0 4.2 3.9 3.6  CL 105 106 103 99 99  CO2 25 27 27 29 30   GLUCOSE 170* 136* 167* 162* 130*  BUN 19 16 17 15 17   CREATININE 0.79 0.68 0.80 0.75 0.83  CALCIUM 7.7* 8.1* 8.2* 8.6* 8.4*   Liver Function Tests: No results for input(s): "AST", "ALT", "ALKPHOS", "BILITOT", "PROT", "ALBUMIN" in the last 168 hours. No results for input(s): "LIPASE", "AMYLASE" in the last 168 hours. No results for input(s): "AMMONIA" in the last 168 hours. CBC: Recent Labs  Lab 01/12/23 0112 01/13/23 0329 01/14/23 0420 01/15/23 0300 01/16/23 0820  WBC 4.9 5.2 5.8 8.1 5.5  HGB 8.3* 7.5* 7.5* 7.8* 7.1*  HCT 27.0* 24.5* 24.3* 25.2* 23.7*  MCV 97.5 98.8 98.8 98.8 101.7*  PLT 116* 118* 112* 152 132*   Cardiac Enzymes: No results for input(s): "CKTOTAL", "CKMB", "CKMBINDEX", "TROPONINI" in the last 168 hours. BNP: Invalid input(s): "POCBNP" CBG: Recent Labs  Lab 01/15/23 0804 01/15/23 1152 01/15/23 1643 01/16/23 0731 01/16/23 1125  GLUCAP 123* 192* 131* 122* 154*   D-Dimer No results for input(s): "DDIMER" in the last 72 hours. Hgb A1c No results for input(s): "HGBA1C" in the last 72 hours. Lipid Profile No results for input(s): "CHOL", "HDL", "LDLCALC", "TRIG", "CHOLHDL", "LDLDIRECT" in the last 72 hours. Thyroid function studies No results for input(s): "TSH", "T4TOTAL", "T3FREE", "THYROIDAB" in the last 72 hours.  Invalid  input(s): "FREET3" Anemia work up No results for input(s): "VITAMINB12", "FOLATE", "FERRITIN", "TIBC", "IRON", "RETICCTPCT" in the last 72 hours. Urinalysis    Component Value Date/Time   COLORURINE BIOCHEMICALS MAY BE AFFECTED BY COLOR (A) 01/07/2023 1847   APPEARANCEUR TURBID (A) 01/07/2023 1847   LABSPEC BIOCHEMICALS MAY BE AFFECTED BY COLOR 01/07/2023 1847   PHURINE BIOCHEMICALS MAY BE AFFECTED BY COLOR 01/07/2023 1847   GLUCOSEU BIOCHEMICALS MAY BE AFFECTED BY COLOR (A) 01/07/2023 1847   HGBUR BIOCHEMICALS MAY BE AFFECTED BY COLOR (A) 01/07/2023 1847   BILIRUBINUR BIOCHEMICALS MAY BE AFFECTED BY COLOR (A) 01/07/2023 1847   KETONESUR BIOCHEMICALS MAY BE AFFECTED BY COLOR (A) 01/07/2023 1847   PROTEINUR BIOCHEMICALS MAY BE AFFECTED BY COLOR (A) 01/07/2023 1847   UROBILINOGEN 1.0 05/06/2014 1401   NITRITE BIOCHEMICALS MAY BE AFFECTED BY COLOR (A) 01/07/2023 1847   LEUKOCYTESUR BIOCHEMICALS MAY BE AFFECTED BY COLOR (A) 01/07/2023 1847  Sepsis Labs Recent Labs  Lab 01/13/23 0329 01/14/23 0420 01/15/23 0300 01/16/23 0820  WBC 5.2 5.8 8.1 5.5   Microbiology Recent Results (from the past 240 hour(s))  Urine Culture (for pregnant, neutropenic or urologic patients or patients with an indwelling urinary catheter)     Status: Abnormal   Collection Time: 01/07/23  6:47 PM   Specimen: Urine, Clean Catch  Result Value Ref Range Status   Specimen Description   Final    URINE, CLEAN CATCH Performed at [facility], [address], [place], [place] [address]    Special Requests   Final    NONE Performed at [facility], [address] [place], [place] [address]    Culture MULTIPLE SPECIES PRESENT, SUGGEST RECOLLECTION (A)  Final   Report Status 01/10/2023 FINAL  Final  MRSA Next Gen by PCR, Nasal     Status: None   Collection Time: 01/09/23  4:37 AM   Specimen: Nasal Mucosa; Nasal Swab  Result Value Ref Range Status   MRSA by PCR Next Gen NOT  DETECTED NOT DETECTED Final    Comment: (NOTE) The GeneXpert MRSA Assay (FDA approved for NASAL specimens only), is one component of a comprehensive MRSA colonization surveillance program. It is not intended to diagnose MRSA infection nor to guide or monitor treatment for MRSA infections. Test performance is not FDA approved in patients less than 83 years old. Performed at [facility], [address] [place], [place] [address]     Please note: You were cared for by a hospitalist during your hospital stay. Once you are discharged, your primary care physician will handle any further medical issues. Please note that NO REFILLS for any discharge medications will be authorized once you are discharged, as it is imperative that you return to your primary care physician (or establish a relationship with a primary care physician if you do not have one) for your post hospital discharge needs so that they can reassess your need for medications and monitor your lab values.    Time coordinating discharge: 40 minutes  SIGNED:   Burnadette Pop, MD  [facility] 01/16/2023, 3:01 PM Pager [phone]  If 7PM-7AM, please contact night-coverage [email or web address] Password [email or web address]

## 2023-01-16 NOTE — Progress Notes (Signed)
Mobility Specialist - Progress Note   01/16/23 1141  Mobility  Activity Ambulated independently in hallway  Level of Assistance Independent  Assistive Device None  Distance Ambulated (ft) 100 ft  Activity Response Tolerated well  Mobility Referral Yes  $Mobility charge 1 Mobility   Pt received in bed and agreeable to mobility. No complaints during session. Pt to bed after session with all needs met.     [facility]

## 2023-01-16 NOTE — Progress Notes (Signed)
Blood still infusing, reported off to night shift nurse to F/U with discharge .

## 2023-01-16 NOTE — Progress Notes (Signed)
Urology Progress Note   2 Days Post-Op s/p TURBT, right ureteral stent placement  Subjective: No acute events overnight.  Patient afebrile and stable.  Pain controlled.  Adequate urine output via Foley which is clear yellow.  Hgb 7.1 Objective: Vital signs in last 24 hours: Temp:  [98 F (36.7 C)-98.2 F (36.8 C)] 98 F (36.7 C) (04/21 0425) Pulse Rate:  [68-88] 68 (04/21 0558) Resp:  [16-20] 16 (04/21 0425) BP: (100-105)/(53-65) 105/65 (04/21 0425) SpO2:  [90 %-96 %] 96 % (04/21 0558)  Intake/Output from previous day: 04/20 0701 - 04/21 0700 In: 120 [P.O.:120] Out: 800 [Urine:800] Intake/Output this shift: No intake/output data recorded.  Physical Exam:  General: Alert and oriented CV: Regular rate Lungs: Normal work of breathing on room air Abdomen: Soft, nondistended, nontender GU: Foley in place draining clear yellow urine Ext: NT, No erythema  Lab Results: Recent Labs    01/14/23 0420 01/15/23 0300 01/16/23 0820  HGB 7.5* 7.8* 7.1*  HCT 24.3* 25.2* 23.7*    BMET Recent Labs    01/14/23 0420  NA 136  K 3.9  CL 99  CO2 29  GLUCOSE 162*  BUN 15  CREATININE 0.75  CALCIUM 8.6*      Studies/Results: DG C-Arm 1-60 Min-No Report  Result Date: 01/14/2023 Fluoroscopy was utilized by the requesting physician.  No radiographic interpretation.    Assessment/Plan:  67 y.o. female with hematuria s/p TURBT, right ureteral stent placement on 01/14/2023 for small bladder tumor and incidental stone.  - Plan for trial of void today. - Surgical pathology still in process  Please page urology with any questions or concerns   LOS: 8 days   Diana Kennedy 01/16/2023, 8:59 AM

## 2023-01-17 ENCOUNTER — Encounter (HOSPITAL_COMMUNITY): Payer: Self-pay | Admitting: Urology

## 2023-01-17 LAB — TYPE AND SCREEN
ABO/RH(D): A NEG
Unit division: 0

## 2023-01-17 LAB — BPAM RBC
Blood Product Expiration Date: 202405162359
ISSUE DATE / TIME: 202404211709
Unit Type and Rh: 600

## 2023-01-18 LAB — SURGICAL PATHOLOGY

## 2023-01-19 NOTE — Op Note (Signed)
NAME: Diana Kennedy, MCNERNEY MEDICAL RECORD NO: [id number] ACCOUNT NO: [id number] DATE OF BIRTH: 27-Mar-1956 FACILITY: [facility] LOCATION: [facility] PHYSICIAN: Sebastian Ache, MD  Operative Report   PREOPERATIVE DIAGNOSES:  Hematuria, history of endometrial cancer, bladder lesion on CT scan, right renal stone.  POSTOPERATIVE DIAGNOSES:  Hematuria, history of endometrial cancer, bladder lesion on CT scan, right renal stone plus bladder cancer.  PROCEDURES PERFORMED:  1.  Transurethral resection of bladder tumor, volume small. 2.  Clot evacuation. 3.  Bilateral retrograde pyelograms with interpretation. 4.  Insertion of right ureteral stent.  ESTIMATED BLOOD LOSS:  Approximately 300 mL of old formed clot removed.  Approximately, 50 mL new blood.  DRAINS:  24-French 3-way catheter to straight drain, irrigation port plugged.  FINDINGS:  1.  Right renal stone, nonobstructing retrograde pyelogram. 2.  Insertion of right ureteral stent, proximal end in the renal pelvis, distal end in urinary bladder. 3.  Unremarkable left retrograde pyelogram. 4.  Approximately 2 cm papillary tumor just lateral to the left ureteral orifice.  This appeared to be most likely urothelial in appearance. 5.  Complete resolution of all actively bleeding following cystoscopy, clot evacuation, transurethral resection of small bladder tumor.  INDICATIONS FOR PROCEDURE:  Diana Kennedy is an unfortunate 67 year old lady with history of advanced endometrial cancer.  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  She presented with worsening hematuria with clots and frank clot retention.  Imaging showed a right renal stone, nonobstructing as well as a questionable enhancing lesion of the bladder.  Overall, the picture was quite concerning for possible  new bladder cancer versus high stage local recurrence of endometrial cancer.  Options were  discussed including recommended path of operative transurethral resection with retrogrades for diagnostic and therapeutic intent.  She presents for this today.   Informed consent was obtained and placed in medical record.  PROCEDURE DETAILS:  The patient being Diana Kennedy verified and the procedure being cystoscopy, retrograde pyelogram, transurethral resection of bladder tumor was confirmed.  Procedure timeout was performed.  Intravenous antibiotics were administered.   General anesthesia introduced.  The patient was placed into a low lithotomy position.  Sterile field was created, prepped and draped the patient's vagina, introitus, and proximal thighs using iodine.  Her in situ 3-way Foley catheter, which was on  irrigation, was removed. Cystourethroscopy was performed using a 26-French resectoscope sheath with visual obturator.  Inspection of urinary bladder revealed large volume formed clot within the bladder obstructing visualization of the mucosa.  As such,  clot irrigation was carefully performed using 60 mL aliquots a Toomey syringe, the resectoscope sheath and approximately 300 mL of current jelly consistency clot was removed and set aside for discard.  Repeat inspection of the bladder following removal  of clot revealed minimally abnormal urothelium except for an area of papillary tumor, just lateral to the left ureteral orifice.  This was approximately 2 cm in size.  This had a gross appearance, most consistent with urothelial carcinoma.  This was  actively bleeding.  Next, the resectoscope loop was used to very carefully to resect this tumor down to superficial fibromuscular stroma of the urinary bladder. Tumor fragments were irrigated and set aside labeled as bladder tumor.  A small  representative deep specimen was obtained using cold cup forceps x 2 and set aside labeled base of bladder tumor and the area was fulgurated with coagulation current with the loop.  There  was no obvious significant  bladder perforation or ureteral orifice  injury.  This resulted in complete hemostasis of the bladder and again, no satellite lesions were noted.  This was quite advantageous.  Attention was directed at retrograde pyelograms.  The left ureteral orifice was cannulated with a 6-French end-hole  catheter, and a left retrograde pyelogram was obtained.  The left retrograde pyelogram demonstrated single left ureter, single system left kidney.  No filling defects or narrowing noted.  Next, right retrograde pyelogram was obtained.  Right retrograde pyelogram demonstrated single right ureter, single system right kidney.  There was a filling defect in the renal pelvis consistent with known stone.  There was no hydronephrosis noted.  Given large volume of stone and the patient  ultimately desiring path of stone free eventually, I felt that stenting would be warranted to prevent obstruction  and to pre-stent for future ureteroscopy versus shockwave lithotripsy and a new 5 x 24 Polaris type stent was carefully placed using  cystoscopic and fluoroscopic guidance over a ZIPwire.  Good proximal and distal planes were noted.  A new 24-French catheter was placed per urethra to straight drain.  This was 3-way type. Irrigation port was plugged as the efflux was essentially clear.   Procedure was terminated.  The patient tolerated the procedure well, no immediate complications.  The patient was taken to postanesthesia care unit in stable condition.  Plan for continued inpatient admission.   M[facility] D: 01/14/2023 4:07:29 pm T: 01/15/2023 2:24:00 am  JOB: [id number]/ [id number]

## 2023-01-25 NOTE — Op Note (Signed)
NAME: Diana Kennedy, Diana Kennedy MEDICAL RECORD NO: [id number] ACCOUNT NO: [id number] DATE OF BIRTH: 02-25-1956 FACILITY: [facility] LOCATION: [facility] PHYSICIAN: Sebastian Ache, MD   Operative Report    PREOPERATIVE DIAGNOSES:  Hematuria, history of endometrial cancer, bladder lesion on CT scan, right renal stone.   POSTOPERATIVE DIAGNOSES:  Hematuria, history of endometrial cancer, bladder lesion on CT scan, right renal stone plus bladder cancer.   PROCEDURES PERFORMED:  1.  Transurethral resection of bladder tumor, volume small. 2.  Clot evacuation. 3.  Bilateral retrograde pyelograms with interpretation. 4.  Insertion of right ureteral stent.   ESTIMATED BLOOD LOSS:  Approximately 300 mL of old formed clot removed.  Approximately, 50 mL new blood.   DRAINS:  24-French 3-way catheter to straight drain, irrigation port plugged.   FINDINGS:  1.  Right renal stone, nonobstructing retrograde pyelogram. 2.  Insertion of right ureteral stent, proximal end in the renal pelvis, distal end in urinary bladder. 3.  Unremarkable left retrograde pyelogram. 4.  Approximately 2 cm papillary tumor just lateral to the left ureteral orifice.  This appeared to be most likely urothelial in appearance. 5.  Complete resolution of all actively bleeding following cystoscopy, clot evacuation, transurethral resection of small bladder tumor.   INDICATIONS FOR PROCEDURE:  Diana Kennedy is an unfortunate 67 year old lady with history of advanced endometrial cancer.  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  She presented with worsening hematuria with clots and frank clot retention.  Imaging showed a right renal stone, nonobstructing as well as a questionable enhancing lesion of the bladder.  Overall, the picture was quite concerning for possible  new bladder cancer versus high stage local recurrence of endometrial cancer.  Options  were discussed including recommended path of operative transurethral resection with retrogrades for diagnostic and therapeutic intent.  She presents for this today.   Informed consent was obtained and placed in medical record.   PROCEDURE DETAILS:  The patient being Diana Kennedy verified and the procedure being cystoscopy, retrograde pyelogram, transurethral resection of bladder tumor was confirmed.  Procedure timeout was performed.  Intravenous antibiotics were administered.   General anesthesia introduced.  The patient was placed into a low lithotomy position.  Sterile field was created, prepped and draped the patient's vagina, introitus, and proximal thighs using iodine.  Her in situ 3-way Foley catheter, which was on  irrigation, was removed. Cystourethroscopy was performed using a 26-French resectoscope sheath with visual obturator.  Inspection of urinary bladder revealed large volume formed clot within the bladder obstructing visualization of the mucosa.  As such,  clot irrigation was carefully performed using 60 mL aliquots a Toomey syringe, the resectoscope sheath and approximately 300 mL of current jelly consistency clot was removed and set aside for discard.  Repeat inspection of the bladder following removal  of clot revealed minimally abnormal urothelium except for an area of papillary tumor, just lateral to the left ureteral orifice.  This was approximately 2 cm in size.  This had a gross appearance, most consistent with urothelial carcinoma.  This was  actively bleeding.  Next, the resectoscope loop was used to very carefully to resect this tumor down to superficial fibromuscular stroma of the urinary bladder. Tumor fragments were irrigated and set aside labeled as bladder tumor.  A small  representative deep specimen was obtained using cold cup forceps x 2 and set aside labeled base of bladder tumor and the area was  fulgurated with coagulation current with the loop.  There was no obvious  significant bladder perforation or ureteral orifice  injury.  This resulted in complete hemostasis of the bladder and again, no satellite lesions were noted.  This was quite advantageous.  Attention was directed at retrograde pyelograms.  The left ureteral orifice was cannulated with a 6-French end-hole  catheter, and a left retrograde pyelogram was obtained.   The left retrograde pyelogram demonstrated single left ureter, single system left kidney.  No filling defects or narrowing noted.  Next, right retrograde pyelogram was obtained.   Right retrograde pyelogram demonstrated single right ureter, single system right kidney.  There was a filling defect in the renal pelvis consistent with known stone.  There was no hydronephrosis noted.  Given large volume of stone and the patient  ultimately desiring path of stone free eventually, I felt that stenting would be warranted to prevent obstruction  and to pre-stent for future ureteroscopy versus shockwave lithotripsy and a new 5 x 24 Polaris type stent was carefully placed using  cystoscopic and fluoroscopic guidance over a ZIPwire.  Good proximal and distal planes were noted.  A new 24-French catheter was placed per urethra to straight drain.  This was 3-way type. Irrigation port was plugged as the efflux was essentially clear.   Procedure was terminated.  The patient tolerated the procedure well, no immediate complications.  The patient was taken to postanesthesia care unit in stable condition.  Plan for continued inpatient admission.

## 2023-02-09 ENCOUNTER — Other Ambulatory Visit: Payer: Self-pay | Admitting: Urology

## 2023-02-16 ENCOUNTER — Encounter (HOSPITAL_BASED_OUTPATIENT_CLINIC_OR_DEPARTMENT_OTHER): Payer: Self-pay | Admitting: Urology

## 2023-02-17 ENCOUNTER — Other Ambulatory Visit: Payer: Self-pay

## 2023-02-17 ENCOUNTER — Encounter (HOSPITAL_BASED_OUTPATIENT_CLINIC_OR_DEPARTMENT_OTHER): Payer: Self-pay | Admitting: Urology

## 2023-02-17 NOTE — Progress Notes (Addendum)
Spoke w/ via phone for pre-op interview---pt Lab needs dos----    I stat Diana Kennedy), ekg            Lab results------1 view chest ray 01-08-2023 epic COVID test -----patient states asymptomatic no test needed Arrive at -------900 am 03-04-2023  NPO after MN NO Solid Food.  Clear liquids from MN until---800 am Med rec completed Medications to take morning of surgery -----levothyroxine, omeprazole, lyrica, hydroxyazine prn, xtampza Diabetic medication -----none day of surgery Patient instructed no nail polish to be worn day of surgery Patient instructed to bring photo id and insurance card day of surgery Patient aware to have Driver (ride ) / caregiver   daughter Diana Kennedy  for 24 hours after surgery  Patient Special Instructions -----none Pre-Op special Instructions -----none Patient verbalized understanding of instructions that were given at this phone interview. Patient denies shortness of breath, chest pain, fever, cough at this phone interview.  Spoke with dr Maurilio Lovely mda and reviewed pt history and patient is Asa 4, pt is to be moved to [facility], spoke with pam and made aware pt is to be done at [facility] per dr Isaias Cowman mda.

## 2023-02-17 NOTE — Patient Instructions (Addendum)
SURGICAL WAITING ROOM VISITATION Patients having surgery or a procedure may have no more than 2 support people in the waiting area - these visitors may rotate.    Children under the age of 46 must have an adult with them who is not the patient.  If the patient needs to stay at the hospital during part of their recovery, the visitor guidelines for inpatient rooms apply. Pre-op nurse will coordinate an appropriate time for 1 support person to accompany patient in pre-op.  This support person may not rotate.    Please refer to the [facility] website for the visitor guidelines for Inpatients (after your surgery is over and you are in a regular room).       Your procedure is scheduled on: 03-11-23   Report to [facility] Main Entrance    Report to admitting at 12:15 PM   Call this number if you have problems the morning of surgery [phone]   Do not eat food or drink liquids:After Midnight.           If you have questions, please contact your surgeon's office.   FOLLOW ANY ADDITIONAL PRE OP INSTRUCTIONS YOU RECEIVED FROM YOUR SURGEON'S OFFICE!!!     Oral Hygiene is also important to reduce your risk of infection.                                    Remember - BRUSH YOUR TEETH THE MORNING OF SURGERY WITH YOUR REGULAR TOOTHPASTE   Do NOT smoke after Midnight   Take these medicines the morning of surgery with A SIP OF WATER:   Hydroxyzine  Levothyroxine  Omeprazole  Oxybutynin  Pregabalin  Xtampza if needed  How to Manage Your Diabetes Before and After Surgery  Why is it important to control my blood sugar before and after surgery? Improving blood sugar levels before and after surgery helps healing and can limit problems. A way of improving blood sugar control is eating a healthy diet by:  Eating less sugar and carbohydrates  Increasing activity/exercise  Talking with your doctor about reaching your blood sugar goals High blood sugars (greater than 180 mg/dL) can  raise your risk of infections and slow your recovery, so you will need to focus on controlling your diabetes during the weeks before surgery. Make sure that the doctor who takes care of your diabetes knows about your planned surgery including the date and location.  How do I manage my blood sugar before surgery? Check your blood sugar at least 4 times a day, starting 2 days before surgery, to make sure that the level is not too high or low. Check your blood sugar the morning of your surgery when you wake up and every 2 hours until you get to the Short Stay unit. If your blood sugar is less than 70 mg/dL, you will need to treat for low blood sugar: Do not take insulin. Treat a low blood sugar (less than 70 mg/dL) with  cup of clear juice (cranberry or apple), 4 glucose tablets, OR glucose gel. Recheck blood sugar in 15 minutes after treatment (to make sure it is greater than 70 mg/dL). If your blood sugar is not greater than 70 mg/dL on recheck, call [phone] for further instructions. Report your blood sugar to the short stay nurse when you get to Short Stay.  If you are admitted to the hospital after surgery: Your blood sugar  will be checked by the staff and you will probably be given insulin after surgery (instead of oral diabetes medicines) to make sure you have good blood sugar levels. The goal for blood sugar control after surgery is 80-180 mg/dL.   WHAT DO I DO ABOUT MY DIABETES MEDICATION?  Do not take oral diabetes medicines (pills) the morning of surgery.  DO NOT TAKE THE FOLLOWING 7 DAYS PRIOR TO SURGERY: Ozempic, Wegovy, Rybelsus (Semaglutide), Byetta (exenatide), Bydureon (exenatide ER), Victoza, Saxenda (liraglutide), or Trulicity (dulaglutide) Mounjaro (Tirzepatide) Adlyxin (Lixisenatide), Polyethylene Glycol Loxenatide.    Bring CPAP mask and tubing day of surgery.                              You may not have any metal on your body including hair pins, jewelry, and body  piercing             Do not wear make-up, lotions, powders, perfumes or deodorant  Do not wear nail polish including gel and S&S, artificial/acrylic nails, or any other type of covering on natural nails including finger and toenails. If you have artificial nails, gel coating, etc. that needs to be removed by a nail salon please have this removed prior to surgery or surgery may need to be canceled/ delayed if the surgeon/ anesthesia feels like they are unable to be safely monitored.   Do not shave  48 hours prior to surgery.    Do not bring valuables to the hospital. [facility] IS NOT RESPONSIBLE   FOR VALUABLES.   Contacts, dentures or bridgework may not be worn into surgery.   DO NOT BRING YOUR HOME MEDICATIONS TO THE HOSPITAL. PHARMACY WILL DISPENSE MEDICATIONS LISTED ON YOUR MEDICATION LIST TO YOU DURING YOUR ADMISSION IN THE HOSPITAL!    Patients discharged on the day of surgery will not be allowed to drive home.  Someone NEEDS to stay with you for the first 24 hours after anesthesia.   Special Instructions: Bring a copy of your healthcare power of attorney and living will documents the day of surgery if you haven't scanned them before.              Please read over the following fact sheets you were given: IF YOU HAVE QUESTIONS ABOUT YOUR PRE-OP INSTRUCTIONS PLEASE CALL [phone]  If you received a COVID test during your pre-op visit  it is requested that you wear a mask when out in public, stay away from anyone that may not be feeling well and notify your surgeon if you develop symptoms. If you test positive for Covid or have been in contact with anyone that has tested positive in the last 10 days please notify you surgeon.  [facility] - Preparing for Surgery Before surgery, you can play an important role.  Because skin is not sterile, your skin needs to be as free of germs as possible.  You can reduce the number of germs on your skin by washing with CHG (chlorahexidine gluconate)  soap before surgery.  CHG is an antiseptic cleaner which kills germs and bonds with the skin to continue killing germs even after washing. Please DO NOT use if you have an allergy to CHG or antibacterial soaps.  If your skin becomes reddened/irritated stop using the CHG and inform your nurse when you arrive at Short Stay. Do not shave (including legs and underarms) for at least 48 hours prior to the first CHG shower.  Please follow these instructions carefully:  1.  Shower with CHG Soap the night before surgery and the  morning of surgery.  2.  If you choose to wash your hair, wash your hair first as usual with your normal  shampoo.  3.  After you shampoo, rinse your hair and body thoroughly to remove the shampoo.                             4.  Use CHG as you would any other liquid soap.  You can apply chg directly to the skin and wash.  Gently                   with a  clean washcloth.  5.  Apply the CHG Soap to your body ONLY FROM THE NECK DOWN.   Do not use on face/ open                           Wound or open sores. Avoid contact with eyes, ears mouth and genitals (private parts).                       Wash face,  Genitals (private parts) with your normal soap.             6.  Wash thoroughly, paying special attention to the area where your  surgery  will be performed.  7.  Thoroughly rinse your body with warm water from the neck down.  8.  DO NOT shower/wash with your normal soap after using and rinsing off the CHG Soap.             9.  Pat yourself dry with a clean towel.            10.  Wear clean pajamas.            11.  Place clean sheets on your bed the night of your first shower and do not  sleep with pets. Day of Surgery : Do not apply any lotions/deodorants the morning of surgery.  Please wear clean clothes to the hospital/surgery center.  FAILURE TO FOLLOW THESE INSTRUCTIONS MAY RESULT IN THE CANCELLATION OF YOUR SURGERY  PATIENT  SIGNATURE_________________________________  NURSE SIGNATURE__________________________________  ________________________________________________________________________

## 2023-02-23 ENCOUNTER — Encounter (HOSPITAL_COMMUNITY): Payer: Self-pay

## 2023-02-28 ENCOUNTER — Other Ambulatory Visit: Payer: Self-pay

## 2023-02-28 ENCOUNTER — Encounter (HOSPITAL_COMMUNITY)
Admission: RE | Admit: 2023-02-28 | Discharge: 2023-02-28 | Disposition: A | Discharge status: Home / Self Care | Payer: Medicare Other | Source: Ambulatory Visit | Attending: Urology | Admitting: Urology

## 2023-02-28 ENCOUNTER — Encounter (HOSPITAL_COMMUNITY): Payer: Self-pay

## 2023-02-28 VITALS — BP 127/73 | HR 97 | Temp 97.7°F | Resp 18 | Ht 65.0 in | Wt 189.0 lb

## 2023-02-28 DIAGNOSIS — D649 Anemia, unspecified: Secondary | ICD-10-CM | POA: Insufficient documentation

## 2023-02-28 DIAGNOSIS — N200 Calculus of kidney: Secondary | ICD-10-CM | POA: Insufficient documentation

## 2023-02-28 DIAGNOSIS — Z951 Presence of aortocoronary bypass graft: Secondary | ICD-10-CM | POA: Insufficient documentation

## 2023-02-28 DIAGNOSIS — Z01818 Encounter for other preprocedural examination: Secondary | ICD-10-CM

## 2023-02-28 DIAGNOSIS — Z8542 Personal history of malignant neoplasm of other parts of uterus: Secondary | ICD-10-CM | POA: Insufficient documentation

## 2023-02-28 DIAGNOSIS — C679 Malignant neoplasm of bladder, unspecified: Secondary | ICD-10-CM | POA: Diagnosis not present

## 2023-02-28 DIAGNOSIS — E119 Type 2 diabetes mellitus without complications: Secondary | ICD-10-CM | POA: Diagnosis not present

## 2023-02-28 DIAGNOSIS — Z87891 Personal history of nicotine dependence: Secondary | ICD-10-CM | POA: Diagnosis not present

## 2023-02-28 DIAGNOSIS — I2510 Atherosclerotic heart disease of native coronary artery without angina pectoris: Secondary | ICD-10-CM | POA: Diagnosis not present

## 2023-02-28 DIAGNOSIS — I10 Essential (primary) hypertension: Secondary | ICD-10-CM | POA: Diagnosis not present

## 2023-02-28 HISTORY — DX: Personal history of urinary calculi: Z87.442

## 2023-02-28 LAB — CBC
HCT: 38.3 % (ref 36.0–46.0)
Hemoglobin: 11.9 g/dL — ABNORMAL LOW (ref 12.0–15.0)
MCH: 29.5 pg (ref 26.0–34.0)
MCHC: 31.1 g/dL (ref 30.0–36.0)
MCV: 94.8 fL (ref 80.0–100.0)
Platelets: 166 10*3/uL (ref 150–400)
RBC: 4.04 MIL/uL (ref 3.87–5.11)
RDW: 15.2 % (ref 11.5–15.5)
WBC: 6.8 10*3/uL (ref 4.0–10.5)
nRBC: 0 % (ref 0.0–0.2)

## 2023-02-28 LAB — BASIC METABOLIC PANEL
Anion gap: 14 (ref 5–15)
BUN: 30 mg/dL — ABNORMAL HIGH (ref 8–23)
CO2: 26 mmol/L (ref 22–32)
Calcium: 9.4 mg/dL (ref 8.9–10.3)
Chloride: 96 mmol/L — ABNORMAL LOW (ref 98–111)
Creatinine, Ser: 1.2 mg/dL — ABNORMAL HIGH (ref 0.44–1.00)
GFR, Estimated: 50 mL/min — ABNORMAL LOW (ref >60)
Glucose, Bld: 358 mg/dL — ABNORMAL HIGH (ref 70–99)
Potassium: 4.5 mmol/L (ref 3.5–5.1)
Sodium: 136 mmol/L (ref 135–145)

## 2023-02-28 LAB — GLUCOSE, CAPILLARY: Glucose-Capillary: 361 mg/dL — ABNORMAL HIGH (ref 70–99)

## 2023-02-28 NOTE — Progress Notes (Signed)
Anesthesia note: PCP - Cheri Kearns PA Cardiologist -Dr. Erlene Quan LOV 2015. Other-   Chest x-ray - 01/08/23 epic EKG - 09/28/22-chart Stress Test - 04/02/14 ECHO - 04/28/18-[facility] Cardiac Cath - 04/22/18-[facility] CABG-no Pacemaker/ICD device last checked:no  Sleep Study - no CPAP -   CBG at PAT visit-361 Fasting Blood Sugar at home-Pt doesn't test Checks Blood Sugar _____  Blood Thinner:no Blood Thinner Instructions: Aspirin Instructions: Last Dose:  Anesthesia review: Yes  reason:  Patient denies shortness of breath, fever, cough and chest pain at PAT appointment. Pt has no SOB with activities.  Her blood sugar was up at the PAT visit. We talked about a diabetic diet with the pt and her granddaughter. She was in the hospital for 10 days due to bladder CA and anemia.  Patient verbalized understanding of instructions that were given to them at the PAT appointment. Patient was also instructed that they will need to review over the PAT instructions again at home before surgery.Yes . Grand daughter lives with the Pt and will help her with instructions and diet.

## 2023-03-01 LAB — HEMOGLOBIN A1C
Hgb A1c MFr Bld: 9 % — ABNORMAL HIGH (ref 4.8–5.6)
Mean Plasma Glucose: 212 mg/dL

## 2023-03-02 NOTE — Progress Notes (Signed)
Anesthesia Chart Review   Case: [id number] Date/Time: 03/11/23 1415   Procedures:      TRANSURETHRAL RESECTION OF BLADDER TUMOR (TURBT)     CYSTOSCOPY WITH RETROGRADE PYELOGRAM, URETEROSCOPY AND STENT EXCHANGE (Right)     HOLMIUM LASER APPLICATION (Right)   Anesthesia type: General   Pre-op diagnosis: RENAL STONE, BLADDER CANCER   Location: [facility]OM 03 / [facility]   Surgeons: Loletta Parish., MD       DISCUSSION:67 y.o. former smoker with h/o HTN, CAD (s/p stents, CABG 2019), right carotid endarterectomy, DM II, endometrial cancer status post chemo/radiation, bladder cancer, renal stone scheduled for above procedure 03/11/2023 with Dr. Sebastian Ache.   Blood glucose 358 at PAT, non-fasting.  A1C 9.0, up from 7.5 on 01/08/2023.   Admission 4/12-4/21/2024 with acute blood loss anemia secondary to gross hematuria, s/p 5 units or PRBC. Hemoglobin 11.9 at PAT visit 02/28/2023. Pt underwent cystoscopy 01/14/2023 during admission with right stent placement.   Cardiac clearance requested.   VS: BP 127/73   Pulse 97   Temp 36.5 C (Oral)   Resp 18   Ht 5\' 5"  (1.651 m)   Wt 85.7 kg   SpO2 96%   BMI 31.45 kg/m   PROVIDERS: Christia Reading, PA-C is PCP    LABS: Labs reviewed: Acceptable for surgery. (all labs ordered are listed, but only abnormal results are displayed)  Labs Reviewed  BASIC METABOLIC PANEL - Abnormal; Notable for the following components:      Result Value   Chloride 96 (*)    Glucose, Bld 358 (*)    BUN 30 (*)    Creatinine, Ser 1.20 (*)    GFR, Estimated 50 (*)    All other components within normal limits  CBC - Abnormal; Notable for the following components:   Hemoglobin 11.9 (*)    All other components within normal limits  HEMOGLOBIN A1C - Abnormal; Notable for the following components:   Hgb A1c MFr Bld 9.0 (*)    All other components within normal limits  GLUCOSE, CAPILLARY - Abnormal; Notable for the following components:   Glucose-Capillary 361 (*)     All other components within normal limits     IMAGES:   EKG:   CV:  Past Medical History:  Diagnosis Date   Anemia    Anxiety    Bladder cancer (HCC)    Carotid artery disease (HCC)    Coronary artery disease    Depression    Diabetes mellitus without complication Type 2    Endometrial cancer (HCC) 2019   Family history of coronary artery disease    Fibromyalgia    Full dentures    GERD (gastroesophageal reflux disease)    History of kidney stones    Hyperlipidemia    Hypertension    Hypothyroidism    Peripheral neuropathy    legs and feet   Subclavian artery stenosis, right (HCC)     Past Surgical History:  Procedure Laterality Date   ABDOMINAL HYSTERECTOMY  2019   BACK SURGERY  09/27/2008   lower   CAROTID-SUBCLAVIAN BYPASS GRAFT Right 05/09/2014   Procedure: Right Carotid Subclavian bypass with 7 mm hemashield graft.;  Surgeon: Nada Libman, MD;  Location: [facility];  Service: Vascular;  Laterality: Right;   CORONARY ANGIOPLASTY WITH STENT PLACEMENT     5 vessel 2004, 2008?   CORONARY ARTERY BYPASS GRAFT  2019   quadruple   CYSTOSCOPY W/ URETERAL STENT PLACEMENT  01/14/2023  Procedure: CYSTOSCOPY WITH BILATERAL RETROGRADE PYELOGRAM/ RIGHT URETERAL STENT PLACEMENT;  Surgeon: Loletta Parish., MD;  Location: [facility];  Service: Urology;;   LEFT HEART CATHETERIZATION WITH CORONARY ANGIOGRAM N/A 04/08/2014   Procedure: LEFT HEART CATHETERIZATION WITH CORONARY ANGIOGRAM;  Surgeon: [name], MD;  Location[facility];  Service: Cardiovascular;  Laterality: N/A;   TRANSURETHRAL RESECTION OF BLADDER TUMOR N/A 01/14/2023   Procedure: TRANSURETHRAL RESECTION OF BLADDER TUMOR (TURBT);  Surgeon: Loletta Parish., MD;  Location: [facility];  Service: Urology;  Laterality: N/A;  45 MINS   US ECHOCARDIOGRAPHY  12/30/2011   mild LVH, mild AOV sclerosis,trace MR,TR,trace/mild physiologic PI    MEDICATIONS:  cyanocobalamin (VITAMIN B12) 1000 MCG tablet    ferrous sulfate 325 (65 FE) MG tablet   hydrOXYzine (ATARAX) 25 MG tablet   levothyroxine (SYNTHROID) 125 MCG tablet   omeprazole (PRILOSEC) 40 MG capsule   oxybutynin (DITROPAN) 5 MG tablet   oxyCODONE (ROXICODONE) 15 MG immediate release tablet   polyethylene glycol (MIRALAX / GLYCOLAX) 17 g packet   pregabalin (LYRICA) 200 MG capsule   senna (SENOKOT) 8.6 MG tablet   SitaGLIPtin-MetFORMIN HCl (JANUMET XR) 50-1000 MG TB24   XTAMPZA ER 13.5 MG C12A   No current facility-administered medications for this encounter.    Jodell Cipro Ward, PA-C [facility] [phone]

## 2023-03-07 ENCOUNTER — Encounter (HOSPITAL_COMMUNITY): Payer: Self-pay

## 2023-03-07 ENCOUNTER — Inpatient Hospital Stay (HOSPITAL_COMMUNITY)
Admission: EM | Admit: 2023-03-07 | Discharge: 2023-03-12 | DRG: 660 | Disposition: A | Discharge status: Home / Self Care | Payer: Medicare Other | Attending: Internal Medicine | Admitting: Internal Medicine

## 2023-03-07 ENCOUNTER — Emergency Department (HOSPITAL_COMMUNITY): Payer: Medicare Other

## 2023-03-07 ENCOUNTER — Other Ambulatory Visit: Payer: Self-pay

## 2023-03-07 DIAGNOSIS — D513 Other dietary vitamin B12 deficiency anemia: Secondary | ICD-10-CM | POA: Diagnosis present

## 2023-03-07 DIAGNOSIS — M797 Fibromyalgia: Secondary | ICD-10-CM | POA: Diagnosis present

## 2023-03-07 DIAGNOSIS — N200 Calculus of kidney: Secondary | ICD-10-CM | POA: Diagnosis not present

## 2023-03-07 DIAGNOSIS — D630 Anemia in neoplastic disease: Secondary | ICD-10-CM | POA: Diagnosis present

## 2023-03-07 DIAGNOSIS — F419 Anxiety disorder, unspecified: Secondary | ICD-10-CM | POA: Diagnosis present

## 2023-03-07 DIAGNOSIS — D509 Iron deficiency anemia, unspecified: Secondary | ICD-10-CM | POA: Diagnosis present

## 2023-03-07 DIAGNOSIS — Z6834 Body mass index (BMI) 34.0-34.9, adult: Secondary | ICD-10-CM | POA: Diagnosis not present

## 2023-03-07 DIAGNOSIS — Z8542 Personal history of malignant neoplasm of other parts of uterus: Secondary | ICD-10-CM

## 2023-03-07 DIAGNOSIS — N202 Calculus of kidney with calculus of ureter: Secondary | ICD-10-CM | POA: Diagnosis present

## 2023-03-07 DIAGNOSIS — Z955 Presence of coronary angioplasty implant and graft: Secondary | ICD-10-CM

## 2023-03-07 DIAGNOSIS — Z01818 Encounter for other preprocedural examination: Secondary | ICD-10-CM

## 2023-03-07 DIAGNOSIS — E785 Hyperlipidemia, unspecified: Secondary | ICD-10-CM | POA: Diagnosis present

## 2023-03-07 DIAGNOSIS — Z8249 Family history of ischemic heart disease and other diseases of the circulatory system: Secondary | ICD-10-CM | POA: Diagnosis not present

## 2023-03-07 DIAGNOSIS — R7401 Elevation of levels of liver transaminase levels: Secondary | ICD-10-CM | POA: Diagnosis present

## 2023-03-07 DIAGNOSIS — Z7984 Long term (current) use of oral hypoglycemic drugs: Secondary | ICD-10-CM

## 2023-03-07 DIAGNOSIS — E1142 Type 2 diabetes mellitus with diabetic polyneuropathy: Secondary | ICD-10-CM | POA: Diagnosis present

## 2023-03-07 DIAGNOSIS — E1165 Type 2 diabetes mellitus with hyperglycemia: Secondary | ICD-10-CM | POA: Diagnosis present

## 2023-03-07 DIAGNOSIS — N1330 Unspecified hydronephrosis: Secondary | ICD-10-CM | POA: Diagnosis not present

## 2023-03-07 DIAGNOSIS — C679 Malignant neoplasm of bladder, unspecified: Secondary | ICD-10-CM | POA: Diagnosis not present

## 2023-03-07 DIAGNOSIS — Z87891 Personal history of nicotine dependence: Secondary | ICD-10-CM | POA: Diagnosis not present

## 2023-03-07 DIAGNOSIS — Z9221 Personal history of antineoplastic chemotherapy: Secondary | ICD-10-CM

## 2023-03-07 DIAGNOSIS — D696 Thrombocytopenia, unspecified: Secondary | ICD-10-CM | POA: Diagnosis present

## 2023-03-07 DIAGNOSIS — E039 Hypothyroidism, unspecified: Secondary | ICD-10-CM | POA: Diagnosis present

## 2023-03-07 DIAGNOSIS — R7989 Other specified abnormal findings of blood chemistry: Secondary | ICD-10-CM | POA: Diagnosis present

## 2023-03-07 DIAGNOSIS — N320 Bladder-neck obstruction: Secondary | ICD-10-CM | POA: Diagnosis not present

## 2023-03-07 DIAGNOSIS — C541 Malignant neoplasm of endometrium: Secondary | ICD-10-CM | POA: Diagnosis present

## 2023-03-07 DIAGNOSIS — Z90710 Acquired absence of both cervix and uterus: Secondary | ICD-10-CM

## 2023-03-07 DIAGNOSIS — N390 Urinary tract infection, site not specified: Principal | ICD-10-CM | POA: Diagnosis present

## 2023-03-07 DIAGNOSIS — Z981 Arthrodesis status: Secondary | ICD-10-CM

## 2023-03-07 DIAGNOSIS — N136 Pyonephrosis: Principal | ICD-10-CM | POA: Diagnosis present

## 2023-03-07 DIAGNOSIS — I714 Abdominal aortic aneurysm, without rupture, unspecified: Secondary | ICD-10-CM | POA: Diagnosis present

## 2023-03-07 DIAGNOSIS — I2510 Atherosclerotic heart disease of native coronary artery without angina pectoris: Secondary | ICD-10-CM | POA: Diagnosis present

## 2023-03-07 DIAGNOSIS — Z8551 Personal history of malignant neoplasm of bladder: Secondary | ICD-10-CM

## 2023-03-07 DIAGNOSIS — N179 Acute kidney failure, unspecified: Secondary | ICD-10-CM | POA: Diagnosis present

## 2023-03-07 DIAGNOSIS — Z79890 Hormone replacement therapy (postmenopausal): Secondary | ICD-10-CM | POA: Diagnosis not present

## 2023-03-07 DIAGNOSIS — Z951 Presence of aortocoronary bypass graft: Secondary | ICD-10-CM

## 2023-03-07 DIAGNOSIS — K219 Gastro-esophageal reflux disease without esophagitis: Secondary | ICD-10-CM | POA: Diagnosis present

## 2023-03-07 DIAGNOSIS — Z87442 Personal history of urinary calculi: Secondary | ICD-10-CM

## 2023-03-07 DIAGNOSIS — R319 Hematuria, unspecified: Secondary | ICD-10-CM | POA: Diagnosis present

## 2023-03-07 DIAGNOSIS — G8929 Other chronic pain: Secondary | ICD-10-CM | POA: Diagnosis present

## 2023-03-07 DIAGNOSIS — E669 Obesity, unspecified: Secondary | ICD-10-CM | POA: Diagnosis present

## 2023-03-07 DIAGNOSIS — Z923 Personal history of irradiation: Secondary | ICD-10-CM

## 2023-03-07 DIAGNOSIS — F32A Depression, unspecified: Secondary | ICD-10-CM | POA: Diagnosis present

## 2023-03-07 DIAGNOSIS — I10 Essential (primary) hypertension: Secondary | ICD-10-CM | POA: Diagnosis present

## 2023-03-07 DIAGNOSIS — K869 Disease of pancreas, unspecified: Secondary | ICD-10-CM | POA: Diagnosis present

## 2023-03-07 LAB — URINALYSIS, ROUTINE W REFLEX MICROSCOPIC
Bilirubin Urine: NEGATIVE
Glucose, UA: >=500 mg/dL — AB
Ketones, ur: NEGATIVE mg/dL
Nitrite: NEGATIVE
Protein, ur: 100 mg/dL — AB
RBC / HPF: >50 RBC/hpf (ref 0–5)
Specific Gravity, Urine: 1.011 (ref 1.005–1.030)
pH: 6 (ref 5.0–8.0)

## 2023-03-07 LAB — COMPREHENSIVE METABOLIC PANEL
ALT: 126 U/L — ABNORMAL HIGH (ref 0–44)
AST: 143 U/L — ABNORMAL HIGH (ref 15–41)
Albumin: 3.5 g/dL (ref 3.5–5.0)
Alkaline Phosphatase: 138 U/L — ABNORMAL HIGH (ref 38–126)
Anion gap: 11 (ref 5–15)
BUN: 34 mg/dL — ABNORMAL HIGH (ref 8–23)
CO2: 24 mmol/L (ref 22–32)
Calcium: 8.4 mg/dL — ABNORMAL LOW (ref 8.9–10.3)
Chloride: 102 mmol/L (ref 98–111)
Creatinine, Ser: 1.26 mg/dL — ABNORMAL HIGH (ref 0.44–1.00)
GFR, Estimated: 47 mL/min — ABNORMAL LOW (ref >60)
Glucose, Bld: 357 mg/dL — ABNORMAL HIGH (ref 70–99)
Potassium: 4.3 mmol/L (ref 3.5–5.1)
Sodium: 137 mmol/L (ref 135–145)
Total Bilirubin: 0.5 mg/dL (ref 0.3–1.2)
Total Protein: 7.2 g/dL (ref 6.5–8.1)

## 2023-03-07 LAB — CBC WITH DIFFERENTIAL/PLATELET
Abs Immature Granulocytes: 0.08 10*3/uL — ABNORMAL HIGH (ref 0.00–0.07)
Basophils Absolute: 0 10*3/uL (ref 0.0–0.1)
Basophils Relative: 0 %
Eosinophils Absolute: 0 10*3/uL (ref 0.0–0.5)
Eosinophils Relative: 1 %
HCT: 35.3 % — ABNORMAL LOW (ref 36.0–46.0)
Hemoglobin: 10.8 g/dL — ABNORMAL LOW (ref 12.0–15.0)
Immature Granulocytes: 1 %
Lymphocytes Relative: 6 %
Lymphs Abs: 0.5 10*3/uL — ABNORMAL LOW (ref 0.7–4.0)
MCH: 29.3 pg (ref 26.0–34.0)
MCHC: 30.6 g/dL (ref 30.0–36.0)
MCV: 95.7 fL (ref 80.0–100.0)
Monocytes Absolute: 0.3 10*3/uL (ref 0.1–1.0)
Monocytes Relative: 4 %
Neutro Abs: 7.1 10*3/uL (ref 1.7–7.7)
Neutrophils Relative %: 88 %
Platelets: 142 10*3/uL — ABNORMAL LOW (ref 150–400)
RBC: 3.69 MIL/uL — ABNORMAL LOW (ref 3.87–5.11)
RDW: 15.4 % (ref 11.5–15.5)
WBC: 8.1 10*3/uL (ref 4.0–10.5)
nRBC: 0 % (ref 0.0–0.2)

## 2023-03-07 LAB — LIPASE, BLOOD: Lipase: 24 U/L (ref 11–51)

## 2023-03-07 LAB — PROCALCITONIN: Procalcitonin: 0.23 ng/mL

## 2023-03-07 LAB — LACTIC ACID, PLASMA: Lactic Acid, Venous: 2.5 mmol/L (ref 0.5–1.9)

## 2023-03-07 LAB — CBG MONITORING, ED: Glucose-Capillary: 195 mg/dL — ABNORMAL HIGH (ref 70–99)

## 2023-03-07 MED ORDER — SODIUM CHLORIDE 0.9 % IV BOLUS
1000.0000 mL | Freq: Once | INTRAVENOUS | Status: AC
  Administered 2023-03-07: 1000 mL via INTRAVENOUS

## 2023-03-07 MED ORDER — HYDROMORPHONE HCL 1 MG/ML IJ SOLN
0.5000 mg | Freq: Once | INTRAMUSCULAR | Status: AC
  Administered 2023-03-07: 0.5 mg via INTRAVENOUS
  Filled 2023-03-07: qty 1

## 2023-03-07 MED ORDER — FENTANYL CITRATE PF 50 MCG/ML IJ SOSY
12.5000 ug | PREFILLED_SYRINGE | INTRAMUSCULAR | Status: DC | PRN
  Administered 2023-03-08: 12.5 ug via INTRAVENOUS
  Filled 2023-03-07: qty 1

## 2023-03-07 MED ORDER — ONDANSETRON HCL 4 MG/2ML IJ SOLN
4.0000 mg | Freq: Four times a day (QID) | INTRAMUSCULAR | Status: DC | PRN
  Administered 2023-03-08: 4 mg via INTRAVENOUS
  Filled 2023-03-07: qty 2

## 2023-03-07 MED ORDER — IOHEXOL 300 MG/ML  SOLN
80.0000 mL | Freq: Once | INTRAMUSCULAR | Status: AC | PRN
  Administered 2023-03-07: 80 mL via INTRAVENOUS

## 2023-03-07 MED ORDER — INSULIN ASPART 100 UNIT/ML IJ SOLN
0.0000 [IU] | INTRAMUSCULAR | Status: DC
  Administered 2023-03-08 (×2): 2 [IU] via SUBCUTANEOUS
  Administered 2023-03-08: 3 [IU] via SUBCUTANEOUS
  Administered 2023-03-08 – 2023-03-09 (×7): 2 [IU] via SUBCUTANEOUS
  Administered 2023-03-09: 1 [IU] via SUBCUTANEOUS
  Administered 2023-03-10: 2 [IU] via SUBCUTANEOUS
  Administered 2023-03-10: 1 [IU] via SUBCUTANEOUS
  Administered 2023-03-10: 2 [IU] via SUBCUTANEOUS
  Administered 2023-03-10 (×2): 1 [IU] via SUBCUTANEOUS
  Administered 2023-03-10 – 2023-03-11 (×2): 2 [IU] via SUBCUTANEOUS
  Administered 2023-03-11: 1 [IU] via SUBCUTANEOUS
  Administered 2023-03-11 – 2023-03-12 (×2): 2 [IU] via SUBCUTANEOUS
  Administered 2023-03-12: 3 [IU] via SUBCUTANEOUS
  Administered 2023-03-12: 2 [IU] via SUBCUTANEOUS
  Filled 2023-03-07: qty 0.09

## 2023-03-07 MED ORDER — ALBUTEROL SULFATE (2.5 MG/3ML) 0.083% IN NEBU: 2.5000 mg | INHALATION_SOLUTION | RESPIRATORY_TRACT | Status: DC | PRN

## 2023-03-07 MED ORDER — SODIUM CHLORIDE 0.9 % IV SOLN: INTRAVENOUS | Status: DC

## 2023-03-07 MED ORDER — ONDANSETRON HCL 4 MG PO TABS
4.0000 mg | ORAL_TABLET | Freq: Four times a day (QID) | ORAL | Status: DC | PRN
  Administered 2023-03-09: 4 mg via ORAL
  Filled 2023-03-07: qty 1

## 2023-03-07 MED ORDER — SODIUM CHLORIDE 0.9 % IV BOLUS
500.0000 mL | Freq: Once | INTRAVENOUS | Status: AC
  Administered 2023-03-07: 500 mL via INTRAVENOUS

## 2023-03-07 MED ORDER — ONDANSETRON HCL 4 MG/2ML IJ SOLN
4.0000 mg | Freq: Once | INTRAMUSCULAR | Status: AC
  Administered 2023-03-07: 4 mg via INTRAVENOUS
  Filled 2023-03-07: qty 2

## 2023-03-07 MED ORDER — SODIUM CHLORIDE 0.9 % IV SOLN
1.0000 g | Freq: Once | INTRAVENOUS | Status: AC
  Administered 2023-03-07: 1 g via INTRAVENOUS
  Filled 2023-03-07: qty 10

## 2023-03-07 MED ORDER — ACETAMINOPHEN 650 MG RE SUPP: 650.0000 mg | Freq: Four times a day (QID) | RECTAL | Status: DC | PRN

## 2023-03-07 MED ORDER — ALBUMIN HUMAN 25 % IV SOLN
12.5000 g | Freq: Once | INTRAVENOUS | Status: AC
  Administered 2023-03-07: 12.5 g via INTRAVENOUS
  Filled 2023-03-07: qty 50

## 2023-03-07 MED ORDER — LACTATED RINGERS IV BOLUS
1000.0000 mL | Freq: Once | INTRAVENOUS | Status: AC
  Administered 2023-03-07: 1000 mL via INTRAVENOUS

## 2023-03-07 MED ORDER — ACETAMINOPHEN 325 MG PO TABS
650.0000 mg | ORAL_TABLET | Freq: Four times a day (QID) | ORAL | Status: DC | PRN
  Administered 2023-03-08: 650 mg via ORAL
  Filled 2023-03-07: qty 2

## 2023-03-07 NOTE — ED Notes (Signed)
Pt family member assisted pt to the restroom ambulatory

## 2023-03-07 NOTE — H&P (Incomplete)
History and Physical    Diana Kennedy ZOX:[id number] DOB: [birth year] DOA: 03/07/2023  PCP: Christia Reading, PA-C  Patient coming from: home  I have personally briefly reviewed patient's old medical records in [facility]  Chief Complaint: RIGHT FLANK X 1 DAY  HPI: Diana Kennedy is a 67 y.o. female with medical history significant of endometrial cancer status post chemo/radiation, nephrolithiasis, diabetes type 2, hypothyroidism ,cystic lesion on body of pancreas,  AAA 3.3 cmGERD, hx of anemia secondary to hematuria related to hx of  bladder tumor s/p TURBT 4/1. Patient of note also at that time had right ureteral stent placement. Patient presents to ED due to progressive right flank pain that started day of presentation. Patient describes pain as sharp pain that initially started on the left and then settled on right.  Patient notes associated nausea and vomiting but no fever or chills. Patient however did also note episode of loose stools. Patient also endorses hematuria which has been persistent for around 3 weeks.  ED Course:  Afeb, bp 154/91, hr 70, rr 20  sat 93% on ra  UA: mod blood wbc 11-20, + bacteria ,+ glucose Wbc 8.1, hgb 10.8 ( 11.9), plt 142 Na 137, K 4.3, CL 102, glu 357, CR 1.26 AST 143 ALT126 Lipase 24 Lactic 2.5 Procal 0.23 TSH: 31.18 NS 1L  Tx diaulid , ns 1L zofran Review of Systems: As per HPI otherwise 10 point review of systems negative.   Past Medical History:  Diagnosis Date   Anemia    Anxiety    Bladder cancer (HCC)    Carotid artery disease (HCC)    Coronary artery disease    Depression    Diabetes mellitus without complication Type 2    Endometrial cancer (HCC) 2019   Family history of coronary artery disease    Fibromyalgia    Full dentures    GERD (gastroesophageal reflux disease)    History of kidney stones    Hyperlipidemia    Hypertension    Hypothyroidism    Peripheral neuropathy    legs and feet   Subclavian artery stenosis,  right [facility])     Past Surgical History:  Procedure Laterality Date   ABDOMINAL HYSTERECTOMY  2019   BACK SURGERY  09/27/2008   lower   CAROTID-SUBCLAVIAN BYPASS GRAFT Right 05/09/2014   Procedure: Right Carotid Subclavian bypass with 7 mm hemashield graft.;  Surgeon: Nada Libman, MD;  Location[facility];  Service: Vascular;  Laterality: Right;   CORONARY ANGIOPLASTY WITH STENT PLACEMENT     5 vessel 2004, 2008?   CORONARY ARTERY BYPASS GRAFT  2019   quadruple   CYSTOSCOPY W/ URETERAL STENT PLACEMENT  01/14/2023   Procedure: CYSTOSCOPY WITH BILATERAL RETROGRADE PYELOGRAM/ RIGHT URETERAL STENT PLACEMENT;  Surgeon: Loletta Parish., MD;  [facility];  Service: Urology;;   LEFT HEART CATHETERIZATION WITH CORONARY ANGIOGRAM N/A 04/08/2014   Procedure: LEFT HEART CATHETERIZATION WITH CORONARY ANGIOGRAM;  Surgeon: [name], MD;  Location: [facility] CATH LAB;  Service: Cardiovascular;  Laterality: N/A;   TRANSURETHRAL RESECTION OF BLADDER TUMOR N/A 01/14/2023   Procedure: TRANSURETHRAL RESECTION OF BLADDER TUMOR (TURBT);  Surgeon: Loletta Parish., MD;  Location[facility];  Service: Urology;  Laterality: N/A;  45 MINS   US ECHOCARDIOGRAPHY  12/30/2011   mild LVH, mild AOV sclerosis,trace MR,TR,trace/mild physiologic PI     reports that she quit smoking about 3 years ago. Her smoking use included cigarettes. She has  a 2.00 pack-year smoking history. She has never used smokeless tobacco. She reports that she does not drink alcohol and does not use drugs.  No Known Allergies  Family History  Problem Relation Age of Onset   Cancer Father        lung   Heart attack Father     Prior to Admission medications   Medication Sig Start Date End Date Taking? Authorizing Provider  cyanocobalamin (VITAMIN B12) 1000 MCG tablet Take 1,000 mcg by mouth daily.    [provider]  ferrous sulfate 325 (65 FE) MG tablet Take 1 tablet (325 mg total) by mouth daily with breakfast.  01/17/23   Burnadette Pop, MD  hydrOXYzine (ATARAX) 25 MG tablet Take 25 mg by mouth every 6 (six) hours as needed for anxiety. 10/08/21   [provider]  levothyroxine (SYNTHROID) 125 MCG tablet Take 125 mcg by mouth daily before breakfast.    [provider]  omeprazole (PRILOSEC) 40 MG capsule Take 40 mg by mouth daily as needed (indigestion).    [provider]  oxybutynin (DITROPAN) 5 MG tablet Take 1 tablet (5 mg total) by mouth every 8 (eight) hours as needed for bladder spasms. 01/16/23   Burnadette Pop, MD  oxyCODONE (ROXICODONE) 15 MG immediate release tablet Take 15 mg by mouth every 6 (six) hours as needed for pain.    [provider]  polyethylene glycol (MIRALAX / GLYCOLAX) 17 g packet Take 17 g by mouth daily. 01/17/23   Burnadette Pop, MD  pregabalin (LYRICA) 200 MG capsule Take 200 mg by mouth 3 (three) times daily.     [provider]  senna (SENOKOT) 8.6 MG tablet Take 2 tablets by mouth at bedtime.    [provider]  SitaGLIPtin-MetFORMIN HCl (JANUMET XR) 50-1000 MG TB24 Take 1 tablet by mouth daily.    [provider]  XTAMPZA ER 13.5 MG C12A Take 1 capsule by mouth in the morning and at bedtime. 08/13/21   [provider]    Physical Exam: Vitals:   03/07/23 1700 03/07/23 1844 03/07/23 2010 03/07/23 2013  BP: (!) 105/45  (!) 105/48 (!) 98/52  Pulse: 71  77 79  Resp: 18  16 16   Temp:  97.6 F (36.4 C)  97.9 F (36.6 C)  TempSrc:  Oral  Oral  SpO2: 94%  93% 94%  Weight:      Height:        Constitutional: NAD, calm, comfortable Vitals:   03/07/23 1700 03/07/23 1844 03/07/23 2010 03/07/23 2013  BP: (!) 105/45  (!) 105/48 (!) 98/52  Pulse: 71  77 79  Resp: 18  16 16   Temp:  97.6 F (36.4 C)  97.9 F (36.6 C)  TempSrc:  Oral  Oral  SpO2: 94%  93% 94%  Weight:      Height:       Eyes: PERRL, lids and conjunctivae normal ENMT: Mucous membranes are moist. Posterior pharynx clear of any  exudate or lesions.Normal dentition.  Neck: normal, supple, no masses, no thyromegaly Respiratory: clear to auscultation bilaterally, no wheezing, no crackles. Normal respiratory effort. No accessory muscle use.  Cardiovascular: Regular rate and rhythm, no murmurs / rubs / gallops. No extremity edema. 2+ pedal pulses. No carotid bruits.  Abdomen: no tenderness, no masses palpated. No hepatosplenomegaly. Bowel sounds positive.  Musculoskeletal: no clubbing / cyanosis. No joint deformity upper and lower extremities. Good ROM, no contractures. Normal muscle tone.  Skin: no rashes, lesions, ulcers. No  induration Neurologic: CN 2-12 grossly intact. Sensation intact, DTR normal. Strength 5/5 in all 4.  Psychiatric: Normal judgment and insight. Alert and oriented x 3. Normal mood.    Labs on Admission: I have personally reviewed following labs and imaging studies  CBC: Recent Labs  Lab 03/07/23 1658  WBC 8.1  NEUTROABS 7.1  HGB 10.8*  HCT 35.3*  MCV 95.7  PLT 142*   Basic Metabolic Panel: Recent Labs  Lab 03/07/23 1658  NA 137  K 4.3  CL 102  CO2 24  GLUCOSE 357*  BUN 34*  CREATININE 1.26*  CALCIUM 8.4*   GFR: Estimated Creatinine Clearance: 50.8 mL/min (A) (by C-G formula based on SCr of 1.26 mg/dL (H)). Liver Function Tests: Recent Labs  Lab 03/07/23 1658  AST 143*  ALT 126*  ALKPHOS 138*  BILITOT 0.5  PROT 7.2  ALBUMIN 3.5   Recent Labs  Lab 03/07/23 1658  LIPASE 24   No results for input(s): "AMMONIA" in the last 168 hours. Coagulation Profile: No results for input(s): "INR", "PROTIME" in the last 168 hours. Cardiac Enzymes: No results for input(s): "CKTOTAL", "CKMB", "CKMBINDEX", "TROPONINI" in the last 168 hours. BNP (last 3 results) No results for input(s): "PROBNP" in the last 8760 hours. HbA1C: No results for input(s): "HGBA1C" in the last 72 hours. CBG: No results for input(s): "GLUCAP" in the last 168 hours. Lipid Profile: No results for  input(s): "CHOL", "HDL", "LDLCALC", "TRIG", "CHOLHDL", "LDLDIRECT" in the last 72 hours. Thyroid Function Tests: No results for input(s): "TSH", "T4TOTAL", "FREET4", "T3FREE", "THYROIDAB" in the last 72 hours. Anemia Panel: No results for input(s): "VITAMINB12", "FOLATE", "FERRITIN", "TIBC", "IRON", "RETICCTPCT" in the last 72 hours. Urine analysis:    Component Value Date/Time   COLORURINE RED (A) 03/07/2023 1636   APPEARANCEUR CLOUDY (A) 03/07/2023 1636   LABSPEC 1.011 03/07/2023 1636   PHURINE 6.0 03/07/2023 1636   GLUCOSEU >=500 (A) 03/07/2023 1636   HGBUR MODERATE (A) 03/07/2023 1636   BILIRUBINUR NEGATIVE 03/07/2023 1636   KETONESUR NEGATIVE 03/07/2023 1636   PROTEINUR 100 (A) 03/07/2023 1636   UROBILINOGEN 1.0 05/06/2014 1401   NITRITE NEGATIVE 03/07/2023 1636   LEUKOCYTESUR TRACE (A) 03/07/2023 1636    Radiological Exams on Admission: CT ABDOMEN PELVIS W WO CONTRAST  Result Date: 03/07/2023 CLINICAL DATA:  Abdominal/flank pain, pelvic pain, history of bladder cancer and nephrolithiasis EXAM: CT ABDOMEN AND PELVIS WITHOUT AND WITH CONTRAST TECHNIQUE: Multidetector CT imaging of the abdomen and pelvis was performed following the standard protocol before and following the bolus administration of intravenous contrast. RADIATION DOSE REDUCTION: This exam was performed according to the departmental dose-optimization program which includes automated exposure control, adjustment of the mA and/or kV according to patient size and/or use of iterative reconstruction technique. CONTRAST:  80mL OMNIPAQUE IOHEXOL 300 MG/ML  SOLN COMPARISON:  01/12/2023 FINDINGS: Lower chest: Median sternotomy has been performed. Extensive coronary artery calcification. Central venous catheter tip visualized at the superior margin of the examination within the terminal superior vena cava. Lingular atelectasis. Hepatobiliary: Mild hepatomegaly and mild hepatic steatosis. No enhancing intrahepatic mass. No intra or  extrahepatic biliary ductal dilation. Gallbladder unremarkable. Pancreas: Unremarkable Spleen: Unremarkable Adrenals/Urinary Tract: The adrenal glands are unremarkable. The kidneys are normal in position. Mild to moderate right renal cortical atrophy and scarring noted. There is moderate right hydronephrosis and hydroureter. A double-J ureteral stent is in expected position. A 9 mm calculus is seen within the proximal right ureter adjacent to the ureteral stent. On the left, there is  moderate hydronephrosis as well as prominent urothelial enhancement and moderate left perinephric stranding. This may relate to a recently passed ureteral calculus, surgical intervention, or an ascending urinary tract infection. A 3 mm nonobstructing calculus is seen within the lower pole of the left kidney. Delayed images demonstrate asymmetric bladder wall thickening involving the base of the bladder, asymmetrically involving the left ureterovesicular junction suspicious for an infiltrative mass in this patient with a history of bladder cancer. Edema related to surgical intervention could appear similarly. There is a small amount of poorly circumscribed extraluminal fluid and infiltration within this region best seen on axial image # 88/6 and sagittal image # 79/11 which again may relate to recent intervention with transmural penetration or transmural infiltration of the aforementioned potential mass. Stomach/Bowel: Stomach is within normal limits. Appendix appears normal. No evidence of bowel wall thickening, distention, or inflammatory changes. Vascular/Lymphatic: Stable 3.2 cm fusiform infrarenal abdominal aortic aneurysm superimposed extensive aortoiliac atherosclerotic calcification. No pathologic adenopathy within the abdomen and pelvis. Reproductive: Infiltrative changes mentioned above along the left posterolateral base of the bladder in the region of the left ureterovesicular junction appear to infiltrate into the prostate  gland itself which appears slightly enlarged, possibly related to edema or infiltration. Fiducial markers are again seen within the prostate gland. Other: None Musculoskeletal: L4-5 posterior lumbar fusion with instrumentation has been performed. No acute bone abnormality. No lytic or blastic bone lesion. IMPRESSION: 1. Asymmetric bladder wall thickening involving the base of the bladder, asymmetrically involving the left ureterovesicular junction suspicious for an infiltrative mass in this patient with a history of bladder cancer. Edema related to surgical intervention could appear similarly. There is a small amount of poorly circumscribed extraluminal fluid and infiltration within this region which again may relate to recent intervention with transmural penetration or transmural infiltration of the aforementioned potential mass. Correlation with surgical history and possible cystoscopy may be helpful for further management. 2. Moderate left hydronephrosis as well as prominent urothelial enhancement and moderate left perinephric stranding. This may relate to a recently passed ureteral calculus, surgical intervention, or an ascending urinary tract infection. Correlation with urinalysis and urine culture may be helpful. 3. Moderate right hydronephrosis and hydroureter despite double-J ureteral stent in expected position. Correlation for possible ureteral stent occlusion is recommended. 9 mm calculus within the proximal right ureter adjacent to the ureteral stent. 4. Mild to moderate right renal cortical atrophy and scarring. 5. Minimal nonobstructing left nephrolithiasis. 6. Stable 3.2 cm fusiform infrarenal abdominal aortic aneurysm. Recommend follow-up ultrasound every 3 years. 7. Aortic atherosclerosis. Aortic Atherosclerosis (ICD10-I70.0). Electronically Signed   By: Helyn Numbers M.D.   On: 03/07/2023 19:37    EKG: Independently reviewed.   Assessment/Plan   B/L Hydronephrosis B/L flank pain  R>L -concern for stent dysfunction ( reflux vs occlusion) -concern for local recurrence of bladder CA/ -concern for nephrolithiasis  -per urology will  plan for Cystoscopy in am  -multimodal  analgesics for pain per urology rec   Complicated UTI   -admit to progressive care  - continue on CTX  -f/u on culture data   Hematuria  -continue to monitor no need for irrigation at this time  -monitor h/h   AKI -in setting of b/l hydronephrosis /UTI  - hold nephrotoxic medications -ivfs     Elevated AST/ALT -note no abn of liver on CT -labs have trended down  - possible related to poor flow related to lower bp  -no prior elevation noted  -monitor to normalization  DMII,  Uncontrolled with hyperglycemia -place on iss/fs    Hypothyrodism Elevated TSH -t3/t4 pending    Endometrial cancer  -status/post hysterectomy/ chemo/radiation -in remission    Nephrolithiasis - no current obstruction stone noted   Cystic lesion on body of pancreas -under surveillance f/u imaging  q1h    AAA 3.3 cm -no active issue  -ensure good bp control   GERD -ppi   Hx of anemia  -secondary to hematuria related to hx of  bladder tumor   DVT prophylaxis: scd Code Status: full/ as discussed per patient wishes in event of cardiac arrest  Family Communication: none at bedside Disposition Plan: patient  expected to be admitted greater than 2 midnights  Consults called: Urology Dr Berneice Heinrich  Admission status: progressive    Lurline Del MD [facility]  If 7PM-7AM, please contact night-coverage [email or web address] Password [facility]  03/07/2023, 10:11 PM

## 2023-03-07 NOTE — Consult Note (Signed)
Urology Consult   Physician requesting consult: Gerhard Munch, MD  Reason for consult: hematuria, b/l hydro, hx bladder cancer  History of Present Illness: Diana Kennedy is a 67 y.o. female with a PMH of CAD, T2DM, HLD, HTN, hypothyroidism, endometrial cancer s/p hysterectomy and XRT, recent diagnosis of HG T1 bladder cancer, and right nephrolithiasis s/p R ureteral stent placement who presented to [facility] ED with abdominal and flank pain. Patient reports sudden onset of flank pain and abdominal pain around 11am this morning with associated nausea, vomiting. She is not able to clearly elucidate which flank the pain was on, though she thinks it was both sides. Pain on the right was reportedly worse. She called her daughter who brought her to the ED for evaluation.  Patient had a right ureteral stent placement for nephrolithaisis on 01/14/23 with Dr. Berneice Heinrich. She also underwent TURBT for a HG T1 lesion adjacent to the left UO. She reports she has tolerated the stent fairly well since that time. She has had intermittent hematuria but it has generally been improving. She is voiding without difficulty at present.   On admission, patient underwent CT hematuria which demonstrates bilateral hydronephrosis with right ureteral stent in position. There is question of asymmetric bladder wall thickening involving the base of the bladder and left UVJ c/f recurrent infiltrative mass.  Past Medical History:  Diagnosis Date   Anemia    Anxiety    Bladder cancer (HCC)    Carotid artery disease (HCC)    Coronary artery disease    Depression    Diabetes mellitus without complication Type 2    Endometrial cancer (HCC) 2019   Family history of coronary artery disease    Fibromyalgia    Full dentures    GERD (gastroesophageal reflux disease)    History of kidney stones    Hyperlipidemia    Hypertension    Hypothyroidism    Peripheral neuropathy    legs and feet   Subclavian artery stenosis, right [facility])     Past  Surgical History:  Procedure Laterality Date   ABDOMINAL HYSTERECTOMY  2019   BACK SURGERY  09/27/2008   lower   CAROTID-SUBCLAVIAN BYPASS GRAFT Right 05/09/2014   Procedure: Right Carotid Subclavian bypass with 7 mm hemashield graft.;  Surgeon: Nada Libman, MD;  Location: [facility] OR;  Service: Vascular;  Laterality: Right;   CORONARY ANGIOPLASTY WITH STENT PLACEMENT     5 vessel 2004, 2008?   CORONARY ARTERY BYPASS GRAFT  2019   quadruple   CYSTOSCOPY W/ URETERAL STENT PLACEMENT  01/14/2023   Procedure: CYSTOSCOPY WITH BILATERAL RETROGRADE PYELOGRAM/ RIGHT URETERAL STENT PLACEMENT;  Surgeon: Loletta Parish., MD;  Location: [facility] ORS;  Service: Urology;;   LEFT HEART CATHETERIZATION WITH CORONARY ANGIOGRAM N/A 04/08/2014   Procedure: LEFT HEART CATHETERIZATION WITH CORONARY ANGIOGRAM;  Surgeon: [name], MD;  Location: [facility] CATH LAB;  Service: Cardiovascular;  Laterality: N/A;   TRANSURETHRAL RESECTION OF BLADDER TUMOR N/A 01/14/2023   Procedure: TRANSURETHRAL RESECTION OF BLADDER TUMOR (TURBT);  Surgeon: Loletta Parish., MD;  Location: [facility] ORS;  Service: Urology;  Laterality: N/A;  45 MINS   US ECHOCARDIOGRAPHY  12/30/2011   mild LVH, mild AOV sclerosis,trace MR,TR,trace/mild physiologic PI     Current Hospital Medications:  Home meds:  No current facility-administered medications on file prior to encounter.   Current Outpatient Medications on File Prior to Encounter  Medication Sig Dispense Refill   cyanocobalamin (VITAMIN B12) 1000 MCG tablet Take 1,000  mcg by mouth daily.     ferrous sulfate 325 (65 FE) MG tablet Take 1 tablet (325 mg total) by mouth daily with breakfast. 30 tablet 1   hydrOXYzine (ATARAX) 25 MG tablet Take 25 mg by mouth every 6 (six) hours as needed for anxiety.     levothyroxine (SYNTHROID) 125 MCG tablet Take 125 mcg by mouth daily before breakfast.     omeprazole (PRILOSEC) 40 MG capsule Take 40 mg by mouth daily as needed (indigestion).      oxybutynin (DITROPAN) 5 MG tablet Take 1 tablet (5 mg total) by mouth every 8 (eight) hours as needed for bladder spasms. 30 tablet 0   oxyCODONE (ROXICODONE) 15 MG immediate release tablet Take 15 mg by mouth every 6 (six) hours as needed for pain.     polyethylene glycol (MIRALAX / GLYCOLAX) 17 g packet Take 17 g by mouth daily. 14 each 0   pregabalin (LYRICA) 200 MG capsule Take 200 mg by mouth 3 (three) times daily.      senna (SENOKOT) 8.6 MG tablet Take 2 tablets by mouth at bedtime.     SitaGLIPtin-MetFORMIN HCl (JANUMET XR) 50-1000 MG TB24 Take 1 tablet by mouth daily.     XTAMPZA ER 13.5 MG C12A Take 1 capsule by mouth in the morning and at bedtime.       Scheduled Meds: Continuous Infusions:  sodium chloride 125 mL/hr at 03/07/23 1844   PRN Meds:.  Allergies: No Known Allergies  Family History  Problem Relation Age of Onset   Cancer Father        lung   Heart attack Father     Social History:  reports that she quit smoking about 3 years ago. Her smoking use included cigarettes. She has a 2.00 pack-year smoking history. She has never used smokeless tobacco. She reports that she does not drink alcohol and does not use drugs.  ROS: A complete review of systems was performed.  All systems are negative except for pertinent findings as noted.  Physical Exam:  Vital signs in last 24 hours: Temp:  [97.6 F (36.4 C)-97.9 F (36.6 C)] 97.9 F (36.6 C) (06/10 2013) Pulse Rate:  [70-79] 79 (06/10 2013) Resp:  [16-20] 16 (06/10 2013) BP: (98-154)/(45-91) 98/52 (06/10 2013) SpO2:  [93 %-94 %] 94 % (06/10 2013) Weight:  [97.5 kg] 97.5 kg (06/10 1431) Constitutional:  Alert and oriented, No acute distress Cardiovascular: Regular rate and rhythm, No JVD Respiratory: Normal respiratory effort, Lungs clear bilaterally GI: Abdomen is soft, nontender, nondistended, no abdominal masses GU: +R CVA tenderness; - L CVA tenderness Lymphatic: No lymphadenopathy Neurologic: Grossly  intact, no focal deficits Psychiatric: Normal mood and affect  Laboratory Data:  Recent Labs    03/07/23 1658  WBC 8.1  HGB 10.8*  HCT 35.3*  PLT 142*    Recent Labs    03/07/23 1658  NA 137  K 4.3  CL 102  GLUCOSE 357*  BUN 34*  CALCIUM 8.4*  CREATININE 1.26*     Results for orders placed or performed during the hospital encounter of 03/07/23 (from the past 24 hour(s))  Urinalysis, Routine w reflex microscopic -Urine, Clean Catch     Status: Abnormal   Collection Time: 03/07/23  4:36 PM  Result Value Ref Range   Color, Urine RED (A) YELLOW   APPearance CLOUDY (A) CLEAR   Specific Gravity, Urine 1.011 1.005 - 1.030   pH 6.0 5.0 - 8.0   Glucose, UA >=500 (A) NEGATIVE  mg/dL   Hgb urine dipstick MODERATE (A) NEGATIVE   Bilirubin Urine NEGATIVE NEGATIVE   Ketones, ur NEGATIVE NEGATIVE mg/dL   Protein, ur 161 (A) NEGATIVE mg/dL   Nitrite NEGATIVE NEGATIVE   Leukocytes,Ua TRACE (A) NEGATIVE   RBC / HPF >50 0 - 5 RBC/hpf   WBC, UA 11-20 0 - 5 WBC/hpf   Bacteria, UA FEW (A) NONE SEEN   Squamous Epithelial / HPF 0-5 0 - 5 /HPF  Comprehensive metabolic panel     Status: Abnormal   Collection Time: 03/07/23  4:58 PM  Result Value Ref Range   Sodium 137 135 - 145 mmol/L   Potassium 4.3 3.5 - 5.1 mmol/L   Chloride 102 98 - 111 mmol/L   CO2 24 22 - 32 mmol/L   Glucose, Bld 357 (H) 70 - 99 mg/dL   BUN 34 (H) 8 - 23 mg/dL   Creatinine, Ser 0.96 (H) 0.44 - 1.00 mg/dL   Calcium 8.4 (L) 8.9 - 10.3 mg/dL   Total Protein 7.2 6.5 - 8.1 g/dL   Albumin 3.5 3.5 - 5.0 g/dL   AST 045 (H) 15 - 41 U/L   ALT 126 (H) 0 - 44 U/L   Alkaline Phosphatase 138 (H) 38 - 126 U/L   Total Bilirubin 0.5 0.3 - 1.2 mg/dL   GFR, Estimated 47 (L) >60 mL/min   Anion gap 11 5 - 15  Lipase, blood     Status: None   Collection Time: 03/07/23  4:58 PM  Result Value Ref Range   Lipase 24 11 - 51 U/L  CBC with Diff     Status: Abnormal   Collection Time: 03/07/23  4:58 PM  Result Value Ref Range    WBC 8.1 4.0 - 10.5 K/uL   RBC 3.69 (L) 3.87 - 5.11 MIL/uL   Hemoglobin 10.8 (L) 12.0 - 15.0 g/dL   HCT 40.9 (L) 81.1 - 91.4 %   MCV 95.7 80.0 - 100.0 fL   MCH 29.3 26.0 - 34.0 pg   MCHC 30.6 30.0 - 36.0 g/dL   RDW 78.2 95.6 - 21.3 %   Platelets 142 (L) 150 - 400 K/uL   nRBC 0.0 0.0 - 0.2 %   Neutrophils Relative % 88 %   Neutro Abs 7.1 1.7 - 7.7 K/uL   Lymphocytes Relative 6 %   Lymphs Abs 0.5 (L) 0.7 - 4.0 K/uL   Monocytes Relative 4 %   Monocytes Absolute 0.3 0.1 - 1.0 K/uL   Eosinophils Relative 1 %   Eosinophils Absolute 0.0 0.0 - 0.5 K/uL   Basophils Relative 0 %   Basophils Absolute 0.0 0.0 - 0.1 K/uL   Immature Granulocytes 1 %   Abs Immature Granulocytes 0.08 (H) 0.00 - 0.07 K/uL   No results found for this or any previous visit (from the past 240 hour(s)).  Renal Function: Recent Labs    03/07/23 1658  CREATININE 1.26*   Estimated Creatinine Clearance: 50.8 mL/min (A) (by C-G formula based on SCr of 1.26 mg/dL (H)).  Radiologic Imaging: CT ABDOMEN PELVIS W WO CONTRAST  Result Date: 03/07/2023 CLINICAL DATA:  Abdominal/flank pain, pelvic pain, history of bladder cancer and nephrolithiasis EXAM: CT ABDOMEN AND PELVIS WITHOUT AND WITH CONTRAST TECHNIQUE: Multidetector CT imaging of the abdomen and pelvis was performed following the standard protocol before and following the bolus administration of intravenous contrast. RADIATION DOSE REDUCTION: This exam was performed according to the departmental dose-optimization program which includes automated exposure control, adjustment of  the mA and/or kV according to patient size and/or use of iterative reconstruction technique. CONTRAST:  80mL OMNIPAQUE IOHEXOL 300 MG/ML  SOLN COMPARISON:  01/12/2023 FINDINGS: Lower chest: Median sternotomy has been performed. Extensive coronary artery calcification. Central venous catheter tip visualized at the superior margin of the examination within the terminal superior vena cava. Lingular  atelectasis. Hepatobiliary: Mild hepatomegaly and mild hepatic steatosis. No enhancing intrahepatic mass. No intra or extrahepatic biliary ductal dilation. Gallbladder unremarkable. Pancreas: Unremarkable Spleen: Unremarkable Adrenals/Urinary Tract: The adrenal glands are unremarkable. The kidneys are normal in position. Mild to moderate right renal cortical atrophy and scarring noted. There is moderate right hydronephrosis and hydroureter. A double-J ureteral stent is in expected position. A 9 mm calculus is seen within the proximal right ureter adjacent to the ureteral stent. On the left, there is moderate hydronephrosis as well as prominent urothelial enhancement and moderate left perinephric stranding. This may relate to a recently passed ureteral calculus, surgical intervention, or an ascending urinary tract infection. A 3 mm nonobstructing calculus is seen within the lower pole of the left kidney. Delayed images demonstrate asymmetric bladder wall thickening involving the base of the bladder, asymmetrically involving the left ureterovesicular junction suspicious for an infiltrative mass in this patient with a history of bladder cancer. Edema related to surgical intervention could appear similarly. There is a small amount of poorly circumscribed extraluminal fluid and infiltration within this region best seen on axial image # 88/6 and sagittal image # 79/11 which again may relate to recent intervention with transmural penetration or transmural infiltration of the aforementioned potential mass. Stomach/Bowel: Stomach is within normal limits. Appendix appears normal. No evidence of bowel wall thickening, distention, or inflammatory changes. Vascular/Lymphatic: Stable 3.2 cm fusiform infrarenal abdominal aortic aneurysm superimposed extensive aortoiliac atherosclerotic calcification. No pathologic adenopathy within the abdomen and pelvis. Reproductive: Infiltrative changes mentioned above along the left  posterolateral base of the bladder in the region of the left ureterovesicular junction appear to infiltrate into the prostate gland itself which appears slightly enlarged, possibly related to edema or infiltration. Fiducial markers are again seen within the prostate gland. Other: None Musculoskeletal: L4-5 posterior lumbar fusion with instrumentation has been performed. No acute bone abnormality. No lytic or blastic bone lesion. IMPRESSION: 1. Asymmetric bladder wall thickening involving the base of the bladder, asymmetrically involving the left ureterovesicular junction suspicious for an infiltrative mass in this patient with a history of bladder cancer. Edema related to surgical intervention could appear similarly. There is a small amount of poorly circumscribed extraluminal fluid and infiltration within this region which again may relate to recent intervention with transmural penetration or transmural infiltration of the aforementioned potential mass. Correlation with surgical history and possible cystoscopy may be helpful for further management. 2. Moderate left hydronephrosis as well as prominent urothelial enhancement and moderate left perinephric stranding. This may relate to a recently passed ureteral calculus, surgical intervention, or an ascending urinary tract infection. Correlation with urinalysis and urine culture may be helpful. 3. Moderate right hydronephrosis and hydroureter despite double-J ureteral stent in expected position. Correlation for possible ureteral stent occlusion is recommended. 9 mm calculus within the proximal right ureter adjacent to the ureteral stent. 4. Mild to moderate right renal cortical atrophy and scarring. 5. Minimal nonobstructing left nephrolithiasis. 6. Stable 3.2 cm fusiform infrarenal abdominal aortic aneurysm. Recommend follow-up ultrasound every 3 years. 7. Aortic atherosclerosis. Aortic Atherosclerosis (ICD10-I70.0). Electronically Signed   By: Helyn Numbers M.D.    On: 03/07/2023 19:37  I independently reviewed the above imaging studies.  Impression/Recommendation: #Bilateral flank pain, bilateral hydronephrosis - Right-sided flank pain likely stent colic, possible UTI; hydro likely in the setting of stent reflux versus less likely stent occlusion - Left-sided flank pain intermittent and less severe per report; currently asymptomatic. Unclear etiology of hydro, though possibly from local recurrence and invasion of bladder lesion. Nephrolithiasis with recently passed stone also possible though only small punctate stones on last two CTs - Plan to observe hydronephrosis and trend Cr with daily labs. We plan to evaluate further during cytoscopy, re-TURBT with Dr. Berneice Heinrich, timing TBD given need for cardiac clearance - Recommend IV antibiotics while culture data results; cater antimicrobials accordingly - Recommend multimodal analgesics for stent colic  - Flomax 0.4mg  qHS  - Oxybutynin 5mg  TID   - Scheduled tylenol, PRN oxycodone  - Would avoid prolonged NSAIDs given hematuria, AKI  #Hematuria - Voiding spontaneously, no concern for retention on history - Please obtain PVR and document in chart - Most likely related to stent irritation versus recurrent malignancy  Carlus Pavlov 03/07/2023, 9:36 PM

## 2023-03-07 NOTE — ED Triage Notes (Addendum)
Patient brought in by EMS due to abdominal pain since 10am this morning. Denies any vomiting, given 4mg  zofran in route. Also given fentanyl in route. EMS reports that pt has multiple kidney stones and bladder cancer. CBG was 348 with EMS. Also given NS. Family also reports vaginal bleeding.

## 2023-03-07 NOTE — ED Notes (Signed)
Pt ambulated without assistance and with a steady gait to restroom and back to bed.

## 2023-03-07 NOTE — ED Provider Notes (Signed)
[place] EMERGENCY DEPARTMENT AT [facility] Provider Note   CSN: [id number] Arrival date & time: 03/07/23  1410     History  Chief Complaint  Patient presents with   Abdominal Pain    Diana Kennedy is a 67 y.o. female.  HPI Patient presents with abdominal pain, left flank.  She is scheduled for surgery later this week with urology due to recently identified bladder cancer as well as known kidney stones. She notes that today the pain was too severe she could not handle and she presents for evaluation.     Home Medications Prior to Admission medications   Medication Sig Start Date End Date Taking? Authorizing Provider  cyanocobalamin (VITAMIN B12) 1000 MCG tablet Take 1,000 mcg by mouth daily.   Yes [provider]  ferrous sulfate 325 (65 FE) MG tablet Take 1 tablet (325 mg total) by mouth daily with breakfast. 01/17/23  Yes Burnadette Pop, MD  hydrOXYzine (ATARAX) 25 MG tablet Take 25 mg by mouth every 6 (six) hours as needed for anxiety. 10/08/21  Yes [provider]  levothyroxine (SYNTHROID) 125 MCG tablet Take 125 mcg by mouth daily before breakfast.   Yes [provider]  omeprazole (PRILOSEC) 40 MG capsule Take 40 mg by mouth daily as needed (indigestion).   Yes [provider]  oxybutynin (DITROPAN) 5 MG tablet Take 1 tablet (5 mg total) by mouth every 8 (eight) hours as needed for bladder spasms. 01/16/23  Yes Burnadette Pop, MD  oxyCODONE (ROXICODONE) 15 MG immediate release tablet Take 15 mg by mouth every 6 (six) hours as needed for pain.   Yes [provider]  polyethylene glycol (MIRALAX / GLYCOLAX) 17 g packet Take 17 g by mouth daily. Patient taking differently: Take 17 g by mouth daily as needed for moderate constipation. 01/17/23  Yes Burnadette Pop, MD  pregabalin (LYRICA) 200 MG capsule Take 200 mg by mouth 3 (three) times daily.    Yes [provider]  senna (SENOKOT) 8.6 MG tablet Take 2 tablets  by mouth at bedtime.   Yes [provider]  XTAMPZA ER 13.5 MG C12A Take 1 capsule by mouth in the morning and at bedtime. 08/13/21  Yes [provider]      Allergies    Patient has no known allergies.    Review of Systems   Review of Systems  All other systems reviewed and are negative.   Physical Exam Updated Vital Signs BP (!) 98/52   Pulse 79   Temp 97.9 F (36.6 C) (Oral)   Resp 16   Ht 5\' 5"  (1.651 m)   Wt 97.5 kg   SpO2 94%   BMI 35.78 kg/m  Physical Exam Vitals and nursing note reviewed.  Constitutional:      General: She is not in acute distress.    Appearance: She is well-developed.  HENT:     Head: Normocephalic and atraumatic.  Eyes:     Conjunctiva/sclera: Conjunctivae normal.  Cardiovascular:     Rate and Rhythm: Normal rate and regular rhythm.  Pulmonary:     Effort: Pulmonary effort is normal. No respiratory distress.     Breath sounds: Normal breath sounds. No stridor.  Abdominal:     General: There is no distension.  Skin:    General: Skin is warm and dry.  Neurological:     Mental Status: She is alert and oriented to person, place, and time.     Cranial Nerves: No cranial  nerve deficit.  Psychiatric:        Mood and Affect: Mood normal.     ED Results / Procedures / Treatments   Labs (all labs ordered are listed, but only abnormal results are displayed) Labs Reviewed  COMPREHENSIVE METABOLIC PANEL - Abnormal; Notable for the following components:      Result Value   Glucose, Bld 357 (*)    BUN 34 (*)    Creatinine, Ser 1.26 (*)    Calcium 8.4 (*)    AST 143 (*)    ALT 126 (*)    Alkaline Phosphatase 138 (*)    GFR, Estimated 47 (*)    All other components within normal limits  CBC WITH DIFFERENTIAL/PLATELET - Abnormal; Notable for the following components:   RBC 3.69 (*)    Hemoglobin 10.8 (*)    HCT 35.3 (*)    Platelets 142 (*)    Lymphs Abs 0.5 (*)    Abs Immature Granulocytes 0.08 (*)    All other  components within normal limits  URINALYSIS, ROUTINE W REFLEX MICROSCOPIC - Abnormal; Notable for the following components:   Color, Urine RED (*)    APPearance CLOUDY (*)    Glucose, UA >=500 (*)    Hgb urine dipstick MODERATE (*)    Protein, ur 100 (*)    Leukocytes,Ua TRACE (*)    Bacteria, UA FEW (*)    All other components within normal limits  LACTIC ACID, PLASMA - Abnormal; Notable for the following components:   Lactic Acid, Venous 2.5 (*)    All other components within normal limits  CBG MONITORING, ED - Abnormal; Notable for the following components:   Glucose-Capillary 195 (*)    All other components within normal limits  URINE CULTURE  LIPASE, BLOOD  PROCALCITONIN  LACTIC ACID, PLASMA  TSH  CBC  PROTIME-INR  COMPREHENSIVE METABOLIC PANEL    EKG None  Radiology CT ABDOMEN PELVIS W WO CONTRAST  Result Date: 03/07/2023 CLINICAL DATA:  Abdominal/flank pain, pelvic pain, history of bladder cancer and nephrolithiasis EXAM: CT ABDOMEN AND PELVIS WITHOUT AND WITH CONTRAST TECHNIQUE: Multidetector CT imaging of the abdomen and pelvis was performed following the standard protocol before and following the bolus administration of intravenous contrast. RADIATION DOSE REDUCTION: This exam was performed according to the departmental dose-optimization program which includes automated exposure control, adjustment of the mA and/or kV according to patient size and/or use of iterative reconstruction technique. CONTRAST:  80mL OMNIPAQUE IOHEXOL 300 MG/ML  SOLN COMPARISON:  01/12/2023 FINDINGS: Lower chest: Median sternotomy has been performed. Extensive coronary artery calcification. Central venous catheter tip visualized at the superior margin of the examination within the terminal superior vena cava. Lingular atelectasis. Hepatobiliary: Mild hepatomegaly and mild hepatic steatosis. No enhancing intrahepatic mass. No intra or extrahepatic biliary ductal dilation. Gallbladder unremarkable.  Pancreas: Unremarkable Spleen: Unremarkable Adrenals/Urinary Tract: The adrenal glands are unremarkable. The kidneys are normal in position. Mild to moderate right renal cortical atrophy and scarring noted. There is moderate right hydronephrosis and hydroureter. A double-J ureteral stent is in expected position. A 9 mm calculus is seen within the proximal right ureter adjacent to the ureteral stent. On the left, there is moderate hydronephrosis as well as prominent urothelial enhancement and moderate left perinephric stranding. This may relate to a recently passed ureteral calculus, surgical intervention, or an ascending urinary tract infection. A 3 mm nonobstructing calculus is seen within the lower pole of the left kidney. Delayed images demonstrate asymmetric bladder wall thickening involving  the base of the bladder, asymmetrically involving the left ureterovesicular junction suspicious for an infiltrative mass in this patient with a history of bladder cancer. Edema related to surgical intervention could appear similarly. There is a small amount of poorly circumscribed extraluminal fluid and infiltration within this region best seen on axial image # 88/6 and sagittal image # 79/11 which again may relate to recent intervention with transmural penetration or transmural infiltration of the aforementioned potential mass. Stomach/Bowel: Stomach is within normal limits. Appendix appears normal. No evidence of bowel wall thickening, distention, or inflammatory changes. Vascular/Lymphatic: Stable 3.2 cm fusiform infrarenal abdominal aortic aneurysm superimposed extensive aortoiliac atherosclerotic calcification. No pathologic adenopathy within the abdomen and pelvis. Reproductive: Infiltrative changes mentioned above along the left posterolateral base of the bladder in the region of the left ureterovesicular junction appear to infiltrate into the prostate gland itself which appears slightly enlarged, possibly related to  edema or infiltration. Fiducial markers are again seen within the prostate gland. Other: None Musculoskeletal: L4-5 posterior lumbar fusion with instrumentation has been performed. No acute bone abnormality. No lytic or blastic bone lesion. IMPRESSION: 1. Asymmetric bladder wall thickening involving the base of the bladder, asymmetrically involving the left ureterovesicular junction suspicious for an infiltrative mass in this patient with a history of bladder cancer. Edema related to surgical intervention could appear similarly. There is a small amount of poorly circumscribed extraluminal fluid and infiltration within this region which again may relate to recent intervention with transmural penetration or transmural infiltration of the aforementioned potential mass. Correlation with surgical history and possible cystoscopy may be helpful for further management. 2. Moderate left hydronephrosis as well as prominent urothelial enhancement and moderate left perinephric stranding. This may relate to a recently passed ureteral calculus, surgical intervention, or an ascending urinary tract infection. Correlation with urinalysis and urine culture may be helpful. 3. Moderate right hydronephrosis and hydroureter despite double-J ureteral stent in expected position. Correlation for possible ureteral stent occlusion is recommended. 9 mm calculus within the proximal right ureter adjacent to the ureteral stent. 4. Mild to moderate right renal cortical atrophy and scarring. 5. Minimal nonobstructing left nephrolithiasis. 6. Stable 3.2 cm fusiform infrarenal abdominal aortic aneurysm. Recommend follow-up ultrasound every 3 years. 7. Aortic atherosclerosis. Aortic Atherosclerosis (ICD10-I70.0). Electronically Signed   By: Helyn Numbers M.D.   On: 03/07/2023 19:37    Procedures Procedures    Medications Ordered in ED Medications  sodium chloride 0.9 % bolus 1,000 mL (0 mLs Intravenous Stopped 03/07/23 1755)    And  0.9 %   sodium chloride infusion ( Intravenous New Bag/Given 03/07/23 1844)  insulin aspart (novoLOG) injection 0-9 Units (has no administration in time range)  acetaminophen (TYLENOL) tablet 650 mg (has no administration in time range)    Or  acetaminophen (TYLENOL) suppository 650 mg (has no administration in time range)  ondansetron (ZOFRAN) tablet 4 mg (has no administration in time range)    Or  ondansetron (ZOFRAN) injection 4 mg (has no administration in time range)  fentaNYL (SUBLIMAZE) injection 12.5 mcg (has no administration in time range)  albuterol (PROVENTIL) (2.5 MG/3ML) 0.083% nebulizer solution 2.5 mg (has no administration in time range)  ondansetron (ZOFRAN) injection 4 mg (4 mg Intravenous Given 03/07/23 1651)  HYDROmorphone (DILAUDID) injection 0.5 mg (0.5 mg Intravenous Given 03/07/23 1651)  iohexol (OMNIPAQUE) 300 MG/ML solution 80 mL (80 mLs Intravenous Contrast Given 03/07/23 1808)  cefTRIAXone (ROCEPHIN) 1 g in sodium chloride 0.9 % 100 mL IVPB (0 g Intravenous Stopped 03/07/23  2213)  lactated ringers bolus 1,000 mL (0 mLs Intravenous Stopped 03/07/23 2213)  sodium chloride 0.9 % bolus 500 mL (500 mLs Intravenous New Bag/Given 03/07/23 2245)  albumin human 25 % solution 12.5 g (0 g Intravenous Stopped 03/07/23 2334)    ED Course/ Medical Decision Making/ A&P                             Medical Decision Making Patient with multiple medical issues including likely bladder cancer, kidney stones, hyperlipidemia and CAD presents with flank pain, abdominal pain, hematuria.  Concern for progression of disease versus kidney stone versus pyelonephritis. Patient had labs CT ordered. Cardiac 70 sinus normal Pulse ox 100% room air normal   Amount and/or Complexity of Data Reviewed External Data Reviewed: notes. Labs: ordered. Decision-making details documented in ED Course. Radiology: ordered and independent interpretation performed. Decision-making details documented in ED  Course.  Risk Prescription drug management. Decision regarding hospitalization.   Update: Patient seen and evaluated on repeat examination, not joined by her daughter.  I discussed her pace case with her urology team and they have seen and evaluated the patient as well.  In essence patient found to have some findings concerning for infection, with abnormal urinalysis, positive lactic acidosis, as well as inflammatory changes and identified on CT scan, beyond those on most recent studies. Given her history of bladder cancer, infection, patient will be admitted for further monitoring, management antibiotics have been started previously. With multiple other medical problems including diabetes and substantial hyperglycemia our urology colleagues requested admission from the internal medicine team and this has been facilitated.         Final Clinical Impression(s) / ED Diagnoses Final diagnoses:  Complicated UTI (urinary tract infection)    Rx / DC Orders ED Discharge Orders     None         Gerhard Munch, MD 03/07/23 2355

## 2023-03-08 DIAGNOSIS — K869 Disease of pancreas, unspecified: Secondary | ICD-10-CM | POA: Diagnosis present

## 2023-03-08 DIAGNOSIS — N1330 Unspecified hydronephrosis: Secondary | ICD-10-CM | POA: Diagnosis not present

## 2023-03-08 DIAGNOSIS — C679 Malignant neoplasm of bladder, unspecified: Secondary | ICD-10-CM | POA: Diagnosis not present

## 2023-03-08 DIAGNOSIS — C541 Malignant neoplasm of endometrium: Secondary | ICD-10-CM | POA: Diagnosis present

## 2023-03-08 LAB — CBG MONITORING, ED
Glucose-Capillary: 178 mg/dL — ABNORMAL HIGH (ref 70–99)
Glucose-Capillary: 226 mg/dL — ABNORMAL HIGH (ref 70–99)

## 2023-03-08 LAB — HEMOGLOBIN AND HEMATOCRIT, BLOOD
HCT: 31 % — ABNORMAL LOW (ref 36.0–46.0)
Hemoglobin: 9.5 g/dL — ABNORMAL LOW (ref 12.0–15.0)

## 2023-03-08 LAB — CBC
HCT: 31.3 % — ABNORMAL LOW (ref 36.0–46.0)
Hemoglobin: 9.6 g/dL — ABNORMAL LOW (ref 12.0–15.0)
MCH: 29.7 pg (ref 26.0–34.0)
MCHC: 30.7 g/dL (ref 30.0–36.0)
MCV: 96.9 fL (ref 80.0–100.0)
Platelets: 130 10*3/uL — ABNORMAL LOW (ref 150–400)
RBC: 3.23 MIL/uL — ABNORMAL LOW (ref 3.87–5.11)
RDW: 15.4 % (ref 11.5–15.5)
WBC: 5.9 10*3/uL (ref 4.0–10.5)
nRBC: 0 % (ref 0.0–0.2)

## 2023-03-08 LAB — TYPE AND SCREEN
ABO/RH(D): A NEG
Antibody Screen: NEGATIVE

## 2023-03-08 LAB — COMPREHENSIVE METABOLIC PANEL
ALT: 96 U/L — ABNORMAL HIGH (ref 0–44)
AST: 99 U/L — ABNORMAL HIGH (ref 15–41)
Albumin: 3.1 g/dL — ABNORMAL LOW (ref 3.5–5.0)
Alkaline Phosphatase: 122 U/L (ref 38–126)
Anion gap: 8 (ref 5–15)
BUN: 24 mg/dL — ABNORMAL HIGH (ref 8–23)
CO2: 24 mmol/L (ref 22–32)
Calcium: 7.6 mg/dL — ABNORMAL LOW (ref 8.9–10.3)
Chloride: 107 mmol/L (ref 98–111)
Creatinine, Ser: 1.04 mg/dL — ABNORMAL HIGH (ref 0.44–1.00)
GFR, Estimated: 59 mL/min — ABNORMAL LOW (ref >60)
Glucose, Bld: 177 mg/dL — ABNORMAL HIGH (ref 70–99)
Potassium: 3.5 mmol/L (ref 3.5–5.1)
Sodium: 139 mmol/L (ref 135–145)
Total Bilirubin: 0.3 mg/dL (ref 0.3–1.2)
Total Protein: 6.2 g/dL — ABNORMAL LOW (ref 6.5–8.1)

## 2023-03-08 LAB — T4, FREE: Free T4: 0.31 ng/dL — ABNORMAL LOW (ref 0.61–1.12)

## 2023-03-08 LAB — PROTIME-INR
INR: 1 (ref 0.8–1.2)
Prothrombin Time: 13.5 seconds (ref 11.4–15.2)

## 2023-03-08 LAB — LACTIC ACID, PLASMA
Lactic Acid, Venous: 2.8 mmol/L (ref 0.5–1.9)
Lactic Acid, Venous: 2.5 mmol/L (ref 0.5–1.9)
Lactic Acid, Venous: 1.3 mmol/L (ref 0.5–1.9)

## 2023-03-08 LAB — TSH: TSH: 31.184 u[IU]/mL — ABNORMAL HIGH (ref 0.350–4.500)

## 2023-03-08 LAB — GLUCOSE, CAPILLARY
Glucose-Capillary: 167 mg/dL — ABNORMAL HIGH (ref 70–99)
Glucose-Capillary: 163 mg/dL — ABNORMAL HIGH (ref 70–99)
Glucose-Capillary: 197 mg/dL — ABNORMAL HIGH (ref 70–99)
Glucose-Capillary: 184 mg/dL — ABNORMAL HIGH (ref 70–99)

## 2023-03-08 MED ORDER — SENNA 8.6 MG PO TABS
2.0000 | ORAL_TABLET | Freq: Every day | ORAL | Status: DC
  Administered 2023-03-09 – 2023-03-11 (×3): 17.2 mg via ORAL
  Filled 2023-03-08 (×4): qty 2

## 2023-03-08 MED ORDER — SODIUM CHLORIDE 0.9 % IV SOLN
1.0000 g | INTRAVENOUS | Status: DC
  Administered 2023-03-08 – 2023-03-11 (×4): 1 g via INTRAVENOUS
  Filled 2023-03-08 (×4): qty 10

## 2023-03-08 MED ORDER — OXYBUTYNIN CHLORIDE 5 MG PO TABS: 5.0000 mg | ORAL_TABLET | Freq: Three times a day (TID) | ORAL | Status: DC | PRN

## 2023-03-08 MED ORDER — OXYCODONE HCL ER 15 MG PO T12A
15.0000 mg | EXTENDED_RELEASE_TABLET | Freq: Two times a day (BID) | ORAL | Status: DC
  Administered 2023-03-08 – 2023-03-12 (×9): 15 mg via ORAL
  Filled 2023-03-08 (×9): qty 1

## 2023-03-08 MED ORDER — HYDROXYZINE HCL 25 MG PO TABS: 25.0000 mg | ORAL_TABLET | Freq: Four times a day (QID) | ORAL | Status: DC | PRN

## 2023-03-08 MED ORDER — OXYCODONE HCL 5 MG PO TABS
15.0000 mg | ORAL_TABLET | Freq: Four times a day (QID) | ORAL | Status: DC | PRN
  Administered 2023-03-08 – 2023-03-12 (×6): 15 mg via ORAL
  Filled 2023-03-08 (×6): qty 3

## 2023-03-08 MED ORDER — POLYETHYLENE GLYCOL 3350 17 G PO PACK: 17.0000 g | PACK | Freq: Every day | ORAL | Status: DC | PRN

## 2023-03-08 MED ORDER — LEVOTHYROXINE SODIUM 25 MCG PO TABS
125.0000 ug | ORAL_TABLET | Freq: Every day | ORAL | Status: DC
  Administered 2023-03-09 – 2023-03-12 (×4): 125 ug via ORAL
  Filled 2023-03-08 (×4): qty 1

## 2023-03-08 MED ORDER — PREGABALIN 75 MG PO CAPS
200.0000 mg | ORAL_CAPSULE | Freq: Three times a day (TID) | ORAL | Status: DC
  Administered 2023-03-08 – 2023-03-12 (×11): 200 mg via ORAL
  Filled 2023-03-08 (×11): qty 1

## 2023-03-08 MED ORDER — PANTOPRAZOLE SODIUM 40 MG PO TBEC
40.0000 mg | DELAYED_RELEASE_TABLET | Freq: Every day | ORAL | Status: DC
  Administered 2023-03-08 – 2023-03-12 (×5): 40 mg via ORAL
  Filled 2023-03-08 (×5): qty 1

## 2023-03-08 NOTE — Progress Notes (Signed)
Subjective: NAEO. Pain improved but still present. Primarily on right-side when present. + nausea, no vomiting. Voiding spontaneously   Objective: Vital signs in last 24 hours: Temp:  [97.6 F (36.4 C)-98.2 F (36.8 C)] 98.2 F (36.8 C) (06/11 1110) Pulse Rate:  [62-79] 62 (06/11 1110) Resp:  [11-20] 16 (06/11 1110) BP: (98-154)/(45-91) 114/73 (06/11 1110) SpO2:  [93 %-97 %] 97 % (06/11 1110) Weight:  [88.8 kg-97.5 kg] 88.8 kg (06/11 0500)  Intake/Output from previous day: 06/10 0701 - 06/11 0700 In: 1503.8 [I.V.:1002.4; IV Piggyback:501.4] Out: -  Intake/Output this shift: No intake/output data recorded.  Physical Exam:  General: Alert and oriented CV: RRR Lungs: Clear Abdomen: Soft, ND GU: voiding spontaneously Ext: NT, No erythema  Lab Results: Recent Labs    03/07/23 1658 03/08/23 0137 03/08/23 0546  HGB 10.8* 9.5* 9.6*  HCT 35.3* 31.0* 31.3*   BMET Recent Labs    03/07/23 1658 03/08/23 0546  NA 137 139  K 4.3 3.5  CL 102 107  CO2 24 24  GLUCOSE 357* 177*  BUN 34* 24*  CREATININE 1.26* 1.04*  CALCIUM 8.4* 7.6*     Studies/Results: CT ABDOMEN PELVIS W WO CONTRAST  Result Date: 03/07/2023 CLINICAL DATA:  Abdominal/flank pain, pelvic pain, history of bladder cancer and nephrolithiasis EXAM: CT ABDOMEN AND PELVIS WITHOUT AND WITH CONTRAST TECHNIQUE: Multidetector CT imaging of the abdomen and pelvis was performed following the standard protocol before and following the bolus administration of intravenous contrast. RADIATION DOSE REDUCTION: This exam was performed according to the departmental dose-optimization program which includes automated exposure control, adjustment of the mA and/or kV according to patient size and/or use of iterative reconstruction technique. CONTRAST:  80mL OMNIPAQUE IOHEXOL 300 MG/ML  SOLN COMPARISON:  01/12/2023 FINDINGS: Lower chest: Median sternotomy has been performed. Extensive coronary artery calcification. Central venous  catheter tip visualized at the superior margin of the examination within the terminal superior vena cava. Lingular atelectasis. Hepatobiliary: Mild hepatomegaly and mild hepatic steatosis. No enhancing intrahepatic mass. No intra or extrahepatic biliary ductal dilation. Gallbladder unremarkable. Pancreas: Unremarkable Spleen: Unremarkable Adrenals/Urinary Tract: The adrenal glands are unremarkable. The kidneys are normal in position. Mild to moderate right renal cortical atrophy and scarring noted. There is moderate right hydronephrosis and hydroureter. A double-J ureteral stent is in expected position. A 9 mm calculus is seen within the proximal right ureter adjacent to the ureteral stent. On the left, there is moderate hydronephrosis as well as prominent urothelial enhancement and moderate left perinephric stranding. This may relate to a recently passed ureteral calculus, surgical intervention, or an ascending urinary tract infection. A 3 mm nonobstructing calculus is seen within the lower pole of the left kidney. Delayed images demonstrate asymmetric bladder wall thickening involving the base of the bladder, asymmetrically involving the left ureterovesicular junction suspicious for an infiltrative mass in this patient with a history of bladder cancer. Edema related to surgical intervention could appear similarly. There is a small amount of poorly circumscribed extraluminal fluid and infiltration within this region best seen on axial image # 88/6 and sagittal image # 79/11 which again may relate to recent intervention with transmural penetration or transmural infiltration of the aforementioned potential mass. Stomach/Bowel: Stomach is within normal limits. Appendix appears normal. No evidence of bowel wall thickening, distention, or inflammatory changes. Vascular/Lymphatic: Stable 3.2 cm fusiform infrarenal abdominal aortic aneurysm superimposed extensive aortoiliac atherosclerotic calcification. No pathologic  adenopathy within the abdomen and pelvis. Reproductive: Infiltrative changes mentioned above along the left posterolateral base of  the bladder in the region of the left ureterovesicular junction appear to infiltrate into the prostate gland itself which appears slightly enlarged, possibly related to edema or infiltration. Fiducial markers are again seen within the prostate gland. Other: None Musculoskeletal: L4-5 posterior lumbar fusion with instrumentation has been performed. No acute bone abnormality. No lytic or blastic bone lesion. IMPRESSION: 1. Asymmetric bladder wall thickening involving the base of the bladder, asymmetrically involving the left ureterovesicular junction suspicious for an infiltrative mass in this patient with a history of bladder cancer. Edema related to surgical intervention could appear similarly. There is a small amount of poorly circumscribed extraluminal fluid and infiltration within this region which again may relate to recent intervention with transmural penetration or transmural infiltration of the aforementioned potential mass. Correlation with surgical history and possible cystoscopy may be helpful for further management. 2. Moderate left hydronephrosis as well as prominent urothelial enhancement and moderate left perinephric stranding. This may relate to a recently passed ureteral calculus, surgical intervention, or an ascending urinary tract infection. Correlation with urinalysis and urine culture may be helpful. 3. Moderate right hydronephrosis and hydroureter despite double-J ureteral stent in expected position. Correlation for possible ureteral stent occlusion is recommended. 9 mm calculus within the proximal right ureter adjacent to the ureteral stent. 4. Mild to moderate right renal cortical atrophy and scarring. 5. Minimal nonobstructing left nephrolithiasis. 6. Stable 3.2 cm fusiform infrarenal abdominal aortic aneurysm. Recommend follow-up ultrasound every 3 years. 7.  Aortic atherosclerosis. Aortic Atherosclerosis (ICD10-I70.0). Electronically Signed   By: Helyn Numbers M.D.   On: 03/07/2023 19:37    Assessment/Plan: #Bilateral flank pain, bilateral hydronephrosis - Symptomatically improving - Cr improving - No plans for acute intervention at this time. As an outpatient, we will plan to evaluate further during cytoscopy, re-TURBT with Dr. Berneice Heinrich, timing TBD given need for cardiac clearance - If able, would appreciate formal evaluation for cardiac clearance while inpatient to expedite her outpatient procedure - Recommend IV antibiotics while urine culture data results; cater antimicrobials accordingly - Recommend multimodal analgesics for stent colic             - Flomax 0.4mg  qHS             - Oxybutynin 5mg  TID              - Scheduled tylenol, PRN oxycodone             - Would avoid prolonged NSAIDs given hematuria, AKI   #Hematuria - Most likely related to stent irritation versus recurrent malignancy - Likely related to stent irritation vs malignancy. Continue to observe   LOS: 1 day   Carlus Pavlov 03/08/2023, 11:34 AM

## 2023-03-08 NOTE — Hospital Course (Signed)
Diana Kennedy with h/o endometrial cancer s/p chemo/rads, DM, hypothyroidism, AA, and h/o anemia 2* to bladder tumor s/p TURBT and ureteral stent placement on 4/1 presenting with R flank pain and persistent intermittent hematuria.  CT showed B hydronephrosis with R ureteral stent in position.  Also with ?asymmetric bladder wall thickening of the base of the bladder and L UVJ concerning for recurrent infiltrative mass.  Urology was consulted and recommends repeat cystoscopy after cardiac clearance, antibiotics for empiric treatment of UTI, and multimodal treatment of stent colic (Flomax, Oxybutynin, Tylenol, Oxycodone).

## 2023-03-08 NOTE — Progress Notes (Signed)
Mobility Specialist - Progress Note   03/08/23 1435  Mobility  Activity Ambulated independently in hallway  Level of Assistance Modified independent, requires aide device or extra time  Assistive Device None  Distance Ambulated (ft) 166 ft  Range of Motion/Exercises Active  Activity Response Tolerated well  Mobility Referral Yes  $Mobility charge 1 Mobility  Mobility Specialist Start Time (ACUTE ONLY) 1425  Mobility Specialist Stop Time (ACUTE ONLY) 1435  Mobility Specialist Time Calculation (min) (ACUTE ONLY) 10 min   Pt was found in bed and agreeable to ambulate. No complaints and at EOS returned to bed with all necessities in reach.  Billey Chang Mobility Specialist

## 2023-03-08 NOTE — Progress Notes (Addendum)
Progress Note   Patient: Diana Kennedy ZOX:[id number] DOB: [birth year] DOA: 03/07/2023     1 DOS: the patient was seen and examined on 03/08/2023   Brief hospital course: 66yo with h/o endometrial cancer s/p chemo/rads, DM, hypothyroidism, AA, and h/o anemia 2* to bladder tumor s/p TURBT and ureteral stent placement on 4/1 presenting with R flank pain and persistent intermittent hematuria.  CT showed B hydronephrosis with R ureteral stent in position.  Also with ?asymmetric bladder wall thickening of the base of the bladder and L UVJ concerning for recurrent infiltrative mass.  Urology was consulted and recommends repeat cystoscopy after cardiac clearance, antibiotics for empiric treatment of UTI, and multimodal treatment of stent colic (Flomax, Oxybutynin, Tylenol, Oxycodone).  Assessment and Plan:   Bladder tumor, persistent hematuria -Patient with TURBT and ureteral stent placement on 4/1 -She has had ongoing hematuria and also developed flank pain -Imaging showed B hydronephrosis -Concern for stent dysfunction ( reflux vs occlusion) vs. local recurrence of bladder CA -Urology is consulting -Initial plan was for cystoscopy today but she is improved and so likely can f/u on 6/14 as scheduled (unless urology opts to defer due to infection) -Elevated lactate is more likely related to this issue, unlikely sepsis -Anemia is likely related to this issue, downtrending Hgb from 11.9 on 6/3 to 9.6 today -Continue multimodal treatment with Flomax, Oxybutynin, Tylenol, Oxycodone  UTI -UA: >500 glucose, moderate Hgb, trace LE, 100 protein, few bacteria -UA not overly suggestive of UTI but procalcitonin was mildly positive -Urine culture pending  -Continue Rocephin for now  Pre-operative stratification -Urologic surgery is associated with an low-intermediate (1-5%) cardiovascular risk for cardiac death and nonfatal MI -The Detsky's Modified Cardiac Risk Index score is Class 1, with a low cardiac  risk -It is reasonable for the patient to go to the OR without additional evaluation/will request cardiology consultation for clearance.  DM -Recent A1c was 9 - poor control -She has no listed DM medications on her med rec -Cover with moderate-scale SSI    Hypothyroidism  -TSH 31.184 but free T4 low at 0.31 -Possible sick euthyroid -Recommend repeat thyroid testing in 4-6 weeks -Continue Synthroid at current dose for now   Endometrial cancer  -s/p hysterectomy and chemo/rads in 2020 with vaginal recurrence treated with radiation in 2022-2023 and additional local/vaginal recurrence treated in fall of 2023 with additional radiation -Followed at [facility], last seen in 11/2022 -Negative cuff pap at that time -Followed q3 months   Cystic lesion on body of pancreas -Appreciated on CT in April -Needs f/u MRCP in 1 year   AAA  -3.3 cm -no active issue  -ensure good bp control    GERD -Continue PPI (Protonix for omeprazole)   Anxiety -Continue hydroxyzine  Chronic pain -I have reviewed this patient in the [place] Controlled Substances Reporting System.  While Xtampza and Oxy are listed as current medications on her medication rec form, there is no indication that she is receiving chronic controlled substances  -I reached out to pharmacy, who reviewed PDMP by the external link and confirmed her home medications.  Will continue Xtampza, pregabalin, and oxy  Obesity -Body mass index is 32.58 kg/m..  -Weight loss should be encouraged -Outpatient PCP/bariatric medicine f/u encouraged     Subjective: She reports feeling some better today.  She was having L -> R flank pain but this is improved.  She has continued to have hematuria and this is concerning to her.   Physical Exam: Vitals:   03/08/23  0430 03/08/23 0500 03/08/23 0558 03/08/23 1110  BP: 116/64  135/68 114/73  Pulse: 64  66 62  Resp: 11  18 16   Temp:   98.1 F (36.7 C) 98.2 F (36.8 C)  TempSrc:   Oral Oral  SpO2: 96%  97%  97%  Weight:  88.8 kg    Height:       General:  Appears calm and comfortable and is in NAD Eyes:  EOMI, normal lids, iris ENT:  grossly normal hearing, lips & tongue, mmm Neck:  no LAD, masses or thyromegaly Cardiovascular:  RRR, no m/r/g. No LE edema.  Respiratory:   CTA bilaterally with no wheezes/rales/rhonchi.  Normal respiratory effort. Abdomen:  soft, NT, ND Back:   normal alignment, no CVAT Skin:  no rash or induration seen on limited exam Musculoskeletal:  grossly normal tone BUE/BLE, good ROM, no bony abnormality Lower extremity:  No LE edema.  Limited foot exam with no ulcerations.  2+ distal pulses. Psychiatric:  blunted mood and affect, speech fluent and appropriate, AOx3 Neurologic:  CN 2-12 grossly intact, moves all extremities in coordinated fashion   Radiological Exams on Admission: Independently reviewed - see discussion in A/P where applicable  CT ABDOMEN PELVIS W WO CONTRAST  Result Date: 03/07/2023 CLINICAL DATA:  Abdominal/flank pain, pelvic pain, history of bladder cancer and nephrolithiasis EXAM: CT ABDOMEN AND PELVIS WITHOUT AND WITH CONTRAST TECHNIQUE: Multidetector CT imaging of the abdomen and pelvis was performed following the standard protocol before and following the bolus administration of intravenous contrast. RADIATION DOSE REDUCTION: This exam was performed according to the departmental dose-optimization program which includes automated exposure control, adjustment of the mA and/or kV according to patient size and/or use of iterative reconstruction technique. CONTRAST:  80mL OMNIPAQUE IOHEXOL 300 MG/ML  SOLN COMPARISON:  01/12/2023 FINDINGS: Lower chest: Median sternotomy has been performed. Extensive coronary artery calcification. Central venous catheter tip visualized at the superior margin of the examination within the terminal superior vena cava. Lingular atelectasis. Hepatobiliary: Mild hepatomegaly and mild hepatic steatosis. No enhancing intrahepatic  mass. No intra or extrahepatic biliary ductal dilation. Gallbladder unremarkable. Pancreas: Unremarkable Spleen: Unremarkable Adrenals/Urinary Tract: The adrenal glands are unremarkable. The kidneys are normal in position. Mild to moderate right renal cortical atrophy and scarring noted. There is moderate right hydronephrosis and hydroureter. A double-J ureteral stent is in expected position. A 9 mm calculus is seen within the proximal right ureter adjacent to the ureteral stent. On the left, there is moderate hydronephrosis as well as prominent urothelial enhancement and moderate left perinephric stranding. This may relate to a recently passed ureteral calculus, surgical intervention, or an ascending urinary tract infection. A 3 mm nonobstructing calculus is seen within the lower pole of the left kidney. Delayed images demonstrate asymmetric bladder wall thickening involving the base of the bladder, asymmetrically involving the left ureterovesicular junction suspicious for an infiltrative mass in this patient with a history of bladder cancer. Edema related to surgical intervention could appear similarly. There is a small amount of poorly circumscribed extraluminal fluid and infiltration within this region best seen on axial image # 88/6 and sagittal image # 79/11 which again may relate to recent intervention with transmural penetration or transmural infiltration of the aforementioned potential mass. Stomach/Bowel: Stomach is within normal limits. Appendix appears normal. No evidence of bowel wall thickening, distention, or inflammatory changes. Vascular/Lymphatic: Stable 3.2 cm fusiform infrarenal abdominal aortic aneurysm superimposed extensive aortoiliac atherosclerotic calcification. No pathologic adenopathy within the abdomen and pelvis. Reproductive: Infiltrative changes  mentioned above along the left posterolateral base of the bladder in the region of the left ureterovesicular junction appear to infiltrate  into the prostate gland itself which appears slightly enlarged, possibly related to edema or infiltration. Fiducial markers are again seen within the prostate gland. Other: None Musculoskeletal: L4-5 posterior lumbar fusion with instrumentation has been performed. No acute bone abnormality. No lytic or blastic bone lesion. IMPRESSION: 1. Asymmetric bladder wall thickening involving the base of the bladder, asymmetrically involving the left ureterovesicular junction suspicious for an infiltrative mass in this patient with a history of bladder cancer. Edema related to surgical intervention could appear similarly. There is a small amount of poorly circumscribed extraluminal fluid and infiltration within this region which again may relate to recent intervention with transmural penetration or transmural infiltration of the aforementioned potential mass. Correlation with surgical history and possible cystoscopy may be helpful for further management. 2. Moderate left hydronephrosis as well as prominent urothelial enhancement and moderate left perinephric stranding. This may relate to a recently passed ureteral calculus, surgical intervention, or an ascending urinary tract infection. Correlation with urinalysis and urine culture may be helpful. 3. Moderate right hydronephrosis and hydroureter despite double-J ureteral stent in expected position. Correlation for possible ureteral stent occlusion is recommended. 9 mm calculus within the proximal right ureter adjacent to the ureteral stent. 4. Mild to moderate right renal cortical atrophy and scarring. 5. Minimal nonobstructing left nephrolithiasis. 6. Stable 3.2 cm fusiform infrarenal abdominal aortic aneurysm. Recommend follow-up ultrasound every 3 years. 7. Aortic atherosclerosis. Aortic Atherosclerosis (ICD10-I70.0). Electronically Signed   By: Helyn Numbers M.D.   On: 03/07/2023 19:37    EKG: none   Labs on Admission: I have personally reviewed the available labs  and imaging studies at the time of the admission.  Pertinent labs:    Glucose 177 BUN 24/Creatinine 1.04/GFR 59; improved from 34/1.26/47 on 6/10 Lactate 2.5, 2.8 Procalcitonin 0.23 WBC 5.9 Hgb 9.6 Platelets 130 INR 1 UA: >500 glucose, moderate Hgb, trace LE, 100 protein, few bacteria Urine culture pending A1c 9.0 on 6/3  Family Communication: None present  Disposition: Status is: Inpatient Remains inpatient appropriate because: IV abx, ongoing monitoring  Planned Discharge Destination: Home    Time spent: 50 minutes  Author: Jonah Blue, MD 03/08/2023 1:30 PM  For on call review [email or web address].

## 2023-03-09 DIAGNOSIS — N1330 Unspecified hydronephrosis: Secondary | ICD-10-CM | POA: Diagnosis not present

## 2023-03-09 DIAGNOSIS — C679 Malignant neoplasm of bladder, unspecified: Secondary | ICD-10-CM | POA: Diagnosis not present

## 2023-03-09 LAB — CBC WITH DIFFERENTIAL/PLATELET
Abs Immature Granulocytes: 0.08 10*3/uL — ABNORMAL HIGH (ref 0.00–0.07)
Basophils Absolute: 0 10*3/uL (ref 0.0–0.1)
Basophils Relative: 0 %
Eosinophils Absolute: 0.1 10*3/uL (ref 0.0–0.5)
Eosinophils Relative: 2 %
HCT: 31.5 % — ABNORMAL LOW (ref 36.0–46.0)
Hemoglobin: 9.5 g/dL — ABNORMAL LOW (ref 12.0–15.0)
Immature Granulocytes: 1 %
Lymphocytes Relative: 23 %
Lymphs Abs: 1.3 10*3/uL (ref 0.7–4.0)
MCH: 29.6 pg (ref 26.0–34.0)
MCHC: 30.2 g/dL (ref 30.0–36.0)
MCV: 98.1 fL (ref 80.0–100.0)
Monocytes Absolute: 0.3 10*3/uL (ref 0.1–1.0)
Monocytes Relative: 6 %
Neutro Abs: 3.8 10*3/uL (ref 1.7–7.7)
Neutrophils Relative %: 68 %
Platelets: 134 10*3/uL — ABNORMAL LOW (ref 150–400)
RBC: 3.21 MIL/uL — ABNORMAL LOW (ref 3.87–5.11)
RDW: 15.8 % — ABNORMAL HIGH (ref 11.5–15.5)
WBC: 5.6 10*3/uL (ref 4.0–10.5)
nRBC: 0 % (ref 0.0–0.2)

## 2023-03-09 LAB — COMPREHENSIVE METABOLIC PANEL
ALT: 77 U/L — ABNORMAL HIGH (ref 0–44)
AST: 65 U/L — ABNORMAL HIGH (ref 15–41)
Albumin: 3 g/dL — ABNORMAL LOW (ref 3.5–5.0)
Alkaline Phosphatase: 118 U/L (ref 38–126)
Anion gap: 8 (ref 5–15)
BUN: 18 mg/dL (ref 8–23)
CO2: 25 mmol/L (ref 22–32)
Calcium: 7.5 mg/dL — ABNORMAL LOW (ref 8.9–10.3)
Chloride: 107 mmol/L (ref 98–111)
Creatinine, Ser: 0.92 mg/dL (ref 0.44–1.00)
GFR, Estimated: >60 mL/min (ref >60)
Glucose, Bld: 143 mg/dL — ABNORMAL HIGH (ref 70–99)
Potassium: 3.9 mmol/L (ref 3.5–5.1)
Sodium: 140 mmol/L (ref 135–145)
Total Bilirubin: 0.2 mg/dL — ABNORMAL LOW (ref 0.3–1.2)
Total Protein: 6.1 g/dL — ABNORMAL LOW (ref 6.5–8.1)

## 2023-03-09 LAB — URINE CULTURE

## 2023-03-09 LAB — GLUCOSE, CAPILLARY
Glucose-Capillary: 115 mg/dL — ABNORMAL HIGH (ref 70–99)
Glucose-Capillary: 123 mg/dL — ABNORMAL HIGH (ref 70–99)
Glucose-Capillary: 176 mg/dL — ABNORMAL HIGH (ref 70–99)
Glucose-Capillary: 192 mg/dL — ABNORMAL HIGH (ref 70–99)
Glucose-Capillary: 195 mg/dL — ABNORMAL HIGH (ref 70–99)
Glucose-Capillary: 196 mg/dL — ABNORMAL HIGH (ref 70–99)
Glucose-Capillary: 200 mg/dL — ABNORMAL HIGH (ref 70–99)

## 2023-03-09 NOTE — Progress Notes (Signed)
.  Transition of Care [facility]) - Inpatient Brief Assessment   Patient Details  Name: Diana Kennedy MRN: [id number] Date of Birth: 1956-02-06  Transition of Care [facility]) CM/SW Contact:    Larrie Kass, LCSW Phone Number: 03/09/2023, 4:55 PM   Clinical Narrative: Transition of Care Department [facility]) has reviewed patient and no TOC needs have been identified at this time. We will continue to monitor patient advancement through interdisciplinary progression rounds. If new patient transition needs arise, please place a TOC consult.     Transition of Care Asessment: Insurance and Status: Insurance coverage has been reviewed Patient has primary care physician: Yes Home environment has been reviewed: yes   Prior/Current Home Services: No current home services Social Determinants of Health Reivew: SDOH reviewed no interventions necessary Readmission risk has been reviewed: No Transition of care needs: no transition of care needs at this time

## 2023-03-09 NOTE — Progress Notes (Signed)
PROGRESS NOTE  Diana Kennedy  DOB: 17-Feb-1956  PCP: Burna Sis ZOX:[id number]  DOA: 03/07/2023  LOS: 2 days  Hospital Day: 3  Brief narrative: Diana Kennedy is a 67 y.o. female with PMH significant for DM2, HTN, HLD, CAD, right subclavian artery stenosis, endometrial cancer s/p chemo/radiation, chronic anemia, hypothyroidism, peripheral neuropathy, anxiety/depression as well as bladder tumor for which she underwent TURBT and ureteral stent placement on 4/1. 6/10, patient presented to the ED with complaint of right flank pain and persistent intermittent hematuria. CT scan showed bilateral hydronephrosis with right ureteral stent in place, asymmetric bladder wall thickening and left UVJ concerning for recurrent infiltrative mass Admitted to [facility] Urology was consulted and recommended repeat cystoscopy, antibiotics for empiric treatment of UTI, and multimodal treatment of stent colic (Flomax, Oxybutynin, Tylenol, Oxycodone).   Subjective: Patient was seen and examined this morning.  Pleasant elderly Caucasian female.  Propped up in bed.  Not in distress.  Pending procedure on Friday 6/14.  Assessment and plan: Bladder tumor s/p TURBT and ureteral stent 4/1 Persistent hematuria Imaging as above showing bilateral hydronephrosis and recurrent infiltrative mass in left UVJ Urology following.  Plans to do repeat TURBT by Dr. Barnie Mort 6/14 Currently on IV antibiotics Also continue Flomax, oxybutynin Urine culture with multiple species present. Currently on IV Rocephin.  Pre-operative stratification Urologic surgery is associated with an low-intermediate (1-5%) cardiovascular risk for cardiac death and nonfatal MI The Detsky's Modified Cardiac Risk Index score is Class 1, with a low cardiac risk It is reasonable for the patient to go to the OR without additional evaluation  Chronic anemia Chronic iron and Vitamin B12 deficiency GERD Hemoglobin stable between 9 and 10 in last 24  hours.  Continue iron and vitamin B12 supplement. Continue PPI Recent Labs    01/08/23 0912 01/08/23 1756 02/28/23 1342 03/07/23 1658 03/08/23 0137 03/08/23 0546 03/09/23 0416  HGB 7.3*   < > 11.9* 10.8* 9.5* 9.6* 9.5*  MCV 98.7   < > 94.8 95.7  --  96.9 98.1  VITAMINB12 145*  --   --   --   --   --   --   FOLATE 8.3  --   --   --   --   --   --   FERRITIN 29  --   --   --   --   --   --   TIBC 451*  --   --   --   --   --   --   IRON 22*  --   --   --   --   --   --    < > = values in this interval not displayed.    Type 2 diabetes mellitus uncontrolled A1c 9 on 02/28/2023 PTA does not seem to be on any antidiabetic meds Currently on sliding scale insulin Accu-Cheks Recent Labs  Lab 03/08/23 2036 03/09/23 0001 03/09/23 0358 03/09/23 0735 03/09/23 1133  GLUCAP 184* 192* 123* 115* 200*   Hypothyroidism TSH 31.184 but free T4 low at 0.31 Continue Synthroid.   Endometrial cancer  s/p hysterectomy and chemo/rads in 2020 with vaginal recurrence treated with radiation in 2022-2023 and additional local/vaginal recurrence treated in fall of 2023 with additional radiation Followed at [facility], last seen in 11/2022 Negative cuff pap at that time Followed q3 months   Cystic lesion on body of pancreas Seen on CT in April Needs f/u MRCP in 1 year   AAA  3.3  cm Continue to follow-up as an outpatient for ultrasound abdomen every 6 months    Anxiety Continue hydroxyzine   Chronic pain Xtampza, pregabalin, and oxy  Obesity  Body mass index is 32.36 kg/m. Patient has been advised to make an attempt to improve diet and exercise patterns to aid in weight loss.   Mobility: Encourage ambulation  Goals of care   Code Status: Full Code    DVT prophylaxis:  SCDs Start: 03/07/23 2220   Antimicrobials: IV Rocephin Fluid: NS at 125 mill per hour Consultants: Urology Family Communication: None at bedside  Status: Inpatient Level of care:  Med-Surg   Patient from:  Home Anticipated d/c to: Pending clinical course Needs to continue in-hospital care:  Pending OR on 6/14    Diet:  Diet Order             Diet Carb Modified Fluid consistency: Thin; Room service appropriate? Yes  Diet effective now                   Scheduled Meds:  insulin aspart  0-9 Units Subcutaneous Q4H   levothyroxine  125 mcg Oral Q0600   oxyCODONE  15 mg Oral Q12H   pantoprazole  40 mg Oral Daily   pregabalin  200 mg Oral TID   senna  2 tablet Oral QHS    PRN meds: acetaminophen **OR** acetaminophen, albuterol, fentaNYL (SUBLIMAZE) injection, hydrOXYzine, ondansetron **OR** ondansetron (ZOFRAN) IV, oxybutynin, oxyCODONE, polyethylene glycol   Infusions:   sodium chloride 125 mL/hr at 03/09/23 0600   cefTRIAXone (ROCEPHIN)  IV Stopped (03/09/23 0815)    Antimicrobials: Anti-infectives (From admission, onward)    Start     Dose/Rate Route Frequency Ordered Stop   03/08/23 2000  cefTRIAXone (ROCEPHIN) 1 g in sodium chloride 0.9 % 100 mL IVPB        1 g 200 mL/hr over 30 Minutes Intravenous Every 24 hours 03/08/23 0337     03/07/23 2030  cefTRIAXone (ROCEPHIN) 1 g in sodium chloride 0.9 % 100 mL IVPB        1 g 200 mL/hr over 30 Minutes Intravenous  Once 03/07/23 2019 03/07/23 2213       Nutritional status:  Body mass index is 32.36 kg/m.          Objective: Vitals:   03/09/23 0400 03/09/23 1135  BP: 110/69 122/63  Pulse: 63 61  Resp: 18 16  Temp: 97.9 F (36.6 C) (!) 97.5 F (36.4 C)  SpO2: 94% 93%    Intake/Output Summary (Last 24 hours) at 03/09/2023 1456 Last data filed at 03/09/2023 1140 Gross per 24 hour  Intake 483.13 ml  Output 800 ml  Net -316.87 ml   Filed Weights   03/07/23 1431 03/08/23 0500 03/09/23 0400  Weight: 97.5 kg 88.8 kg 88.2 kg   Weight change: -9.323 kg Body mass index is 32.36 kg/m.   Physical Exam: General exam: Pleasant, elderly Caucasian female.  Not in physical distress Skin: No rashes, lesions or  ulcers. HEENT: Atraumatic, normocephalic, no obvious bleeding Lungs: Clear to auscultation bilaterally CVS: Regular rate and rhythm, no murmur GI/Abd soft, nontender, nondistended, blood sound present CNS: Alert, awake, oriented x 3 Psychiatry: Mood appropriate Extremities: No pedal edema, no calf tenderness  Data Review: I have personally reviewed the laboratory data and studies available.  F/u labs ordered Unresulted Labs (From admission, onward)     Start     Ordered   03/10/23 0500  CBC with Differential/Platelet  Tomorrow  morning,   R        03/09/23 1453   03/10/23 0500  Basic metabolic panel  Tomorrow morning,   R        03/09/23 1453   03/08/23 0709  T3  Once,   R        03/08/23 0708            Total time spent in review of labs and imaging, patient evaluation, formulation of plan, documentation and communication with family: 45 minutes  Signed, Lorin Glass, MD [facility] 03/09/2023

## 2023-03-09 NOTE — Progress Notes (Signed)
Subjective: NAEO. Pain essentially resolved. Voiding without issue, urine is reportedly bloody. Cr normalized.   Objective: Vital signs in last 24 hours: Temp:  [97.9 F (36.6 C)-98.2 F (36.8 C)] 97.9 F (36.6 C) (06/12 0400) Pulse Rate:  [62-67] 63 (06/12 0400) Resp:  [16-20] 18 (06/12 0400) BP: (110-129)/(64-74) 110/69 (06/12 0400) SpO2:  [93 %-97 %] 94 % (06/12 0400) Weight:  [88.2 kg] 88.2 kg (06/12 0400)  Intake/Output from previous day: 06/11 0701 - 06/12 0700 In: 723.1 [P.O.:720; I.V.:3.1] Out: 500 [Urine:500] Intake/Output this shift: No intake/output data recorded.  Physical Exam:  General: Alert and oriented CV: RRR Lungs: Clear Abdomen: Soft, ND GU: voiding spontaneously Ext: NT, No erythema  Lab Results: Recent Labs    03/08/23 0137 03/08/23 0546 03/09/23 0416  HGB 9.5* 9.6* 9.5*  HCT 31.0* 31.3* 31.5*    BMET Recent Labs    03/08/23 0546 03/09/23 0416  NA 139 140  K 3.5 3.9  CL 107 107  CO2 24 25  GLUCOSE 177* 143*  BUN 24* 18  CREATININE 1.04* 0.92  CALCIUM 7.6* 7.5*      Studies/Results: CT ABDOMEN PELVIS W WO CONTRAST  Result Date: 03/07/2023 CLINICAL DATA:  Abdominal/flank pain, pelvic pain, history of bladder cancer and nephrolithiasis EXAM: CT ABDOMEN AND PELVIS WITHOUT AND WITH CONTRAST TECHNIQUE: Multidetector CT imaging of the abdomen and pelvis was performed following the standard protocol before and following the bolus administration of intravenous contrast. RADIATION DOSE REDUCTION: This exam was performed according to the departmental dose-optimization program which includes automated exposure control, adjustment of the mA and/or kV according to patient size and/or use of iterative reconstruction technique. CONTRAST:  80mL OMNIPAQUE IOHEXOL 300 MG/ML  SOLN COMPARISON:  01/12/2023 FINDINGS: Lower chest: Median sternotomy has been performed. Extensive coronary artery calcification. Central venous catheter tip visualized at the  superior margin of the examination within the terminal superior vena cava. Lingular atelectasis. Hepatobiliary: Mild hepatomegaly and mild hepatic steatosis. No enhancing intrahepatic mass. No intra or extrahepatic biliary ductal dilation. Gallbladder unremarkable. Pancreas: Unremarkable Spleen: Unremarkable Adrenals/Urinary Tract: The adrenal glands are unremarkable. The kidneys are normal in position. Mild to moderate right renal cortical atrophy and scarring noted. There is moderate right hydronephrosis and hydroureter. A double-J ureteral stent is in expected position. A 9 mm calculus is seen within the proximal right ureter adjacent to the ureteral stent. On the left, there is moderate hydronephrosis as well as prominent urothelial enhancement and moderate left perinephric stranding. This may relate to a recently passed ureteral calculus, surgical intervention, or an ascending urinary tract infection. A 3 mm nonobstructing calculus is seen within the lower pole of the left kidney. Delayed images demonstrate asymmetric bladder wall thickening involving the base of the bladder, asymmetrically involving the left ureterovesicular junction suspicious for an infiltrative mass in this patient with a history of bladder cancer. Edema related to surgical intervention could appear similarly. There is a small amount of poorly circumscribed extraluminal fluid and infiltration within this region best seen on axial image # 88/6 and sagittal image # 79/11 which again may relate to recent intervention with transmural penetration or transmural infiltration of the aforementioned potential mass. Stomach/Bowel: Stomach is within normal limits. Appendix appears normal. No evidence of bowel wall thickening, distention, or inflammatory changes. Vascular/Lymphatic: Stable 3.2 cm fusiform infrarenal abdominal aortic aneurysm superimposed extensive aortoiliac atherosclerotic calcification. No pathologic adenopathy within the abdomen and  pelvis. Reproductive: Infiltrative changes mentioned above along the left posterolateral base of the bladder in the  region of the left ureterovesicular junction appear to infiltrate into the prostate gland itself which appears slightly enlarged, possibly related to edema or infiltration. Fiducial markers are again seen within the prostate gland. Other: None Musculoskeletal: L4-5 posterior lumbar fusion with instrumentation has been performed. No acute bone abnormality. No lytic or blastic bone lesion. IMPRESSION: 1. Asymmetric bladder wall thickening involving the base of the bladder, asymmetrically involving the left ureterovesicular junction suspicious for an infiltrative mass in this patient with a history of bladder cancer. Edema related to surgical intervention could appear similarly. There is a small amount of poorly circumscribed extraluminal fluid and infiltration within this region which again may relate to recent intervention with transmural penetration or transmural infiltration of the aforementioned potential mass. Correlation with surgical history and possible cystoscopy may be helpful for further management. 2. Moderate left hydronephrosis as well as prominent urothelial enhancement and moderate left perinephric stranding. This may relate to a recently passed ureteral calculus, surgical intervention, or an ascending urinary tract infection. Correlation with urinalysis and urine culture may be helpful. 3. Moderate right hydronephrosis and hydroureter despite double-J ureteral stent in expected position. Correlation for possible ureteral stent occlusion is recommended. 9 mm calculus within the proximal right ureter adjacent to the ureteral stent. 4. Mild to moderate right renal cortical atrophy and scarring. 5. Minimal nonobstructing left nephrolithiasis. 6. Stable 3.2 cm fusiform infrarenal abdominal aortic aneurysm. Recommend follow-up ultrasound every 3 years. 7. Aortic atherosclerosis. Aortic  Atherosclerosis (ICD10-I70.0). Electronically Signed   By: Helyn Numbers M.D.   On: 03/07/2023 19:37    Assessment/Plan: #Bilateral flank pain, bilateral hydronephrosis - Symptomatically improved - Cr normalized - Will plan to proceed with cysto, re-TURBT, R stent exchange, L RPG with Dr. Berneice Heinrich on 03/11/23. Ideally patient would remain inpatient through procedure and remain on IV abx. She can discharge on Friday following OR - Recommend multimodal analgesics for stent colic             - Flomax 0.4mg  qHS             - Oxybutynin 5mg  TID              - Scheduled tylenol, PRN oxycodone             - Would avoid prolonged NSAIDs given hematuria, AKI   #Hematuria - Most likely related to stent irritation versus recurrent malignancy - Likely related to stent irritation vs malignancy. Continue to observe   LOS: 2 days   Carlus Pavlov 03/09/2023, 8:37 AM

## 2023-03-10 DIAGNOSIS — N1330 Unspecified hydronephrosis: Secondary | ICD-10-CM | POA: Diagnosis not present

## 2023-03-10 DIAGNOSIS — C679 Malignant neoplasm of bladder, unspecified: Secondary | ICD-10-CM | POA: Diagnosis not present

## 2023-03-10 LAB — T3: T3, Total: 24 ng/dL — ABNORMAL LOW (ref 71–180)

## 2023-03-10 LAB — BASIC METABOLIC PANEL
Anion gap: 7 (ref 5–15)
BUN: 16 mg/dL (ref 8–23)
CO2: 24 mmol/L (ref 22–32)
Calcium: 7.8 mg/dL — ABNORMAL LOW (ref 8.9–10.3)
Chloride: 109 mmol/L (ref 98–111)
Creatinine, Ser: 0.93 mg/dL (ref 0.44–1.00)
GFR, Estimated: >60 mL/min (ref >60)
Glucose, Bld: 172 mg/dL — ABNORMAL HIGH (ref 70–99)
Potassium: 4.1 mmol/L (ref 3.5–5.1)
Sodium: 140 mmol/L (ref 135–145)

## 2023-03-10 LAB — CBC WITH DIFFERENTIAL/PLATELET
Abs Immature Granulocytes: 0.12 10*3/uL — ABNORMAL HIGH (ref 0.00–0.07)
Basophils Absolute: 0 10*3/uL (ref 0.0–0.1)
Basophils Relative: 0 %
Eosinophils Absolute: 0.1 10*3/uL (ref 0.0–0.5)
Eosinophils Relative: 1 %
HCT: 32.3 % — ABNORMAL LOW (ref 36.0–46.0)
Hemoglobin: 9.5 g/dL — ABNORMAL LOW (ref 12.0–15.0)
Immature Granulocytes: 2 %
Lymphocytes Relative: 19 %
Lymphs Abs: 1.1 10*3/uL (ref 0.7–4.0)
MCH: 29.4 pg (ref 26.0–34.0)
MCHC: 29.4 g/dL — ABNORMAL LOW (ref 30.0–36.0)
MCV: 100 fL (ref 80.0–100.0)
Monocytes Absolute: 0.3 10*3/uL (ref 0.1–1.0)
Monocytes Relative: 6 %
Neutro Abs: 4.2 10*3/uL (ref 1.7–7.7)
Neutrophils Relative %: 72 %
Platelets: 125 10*3/uL — ABNORMAL LOW (ref 150–400)
RBC: 3.23 MIL/uL — ABNORMAL LOW (ref 3.87–5.11)
RDW: 15.9 % — ABNORMAL HIGH (ref 11.5–15.5)
WBC: 5.9 10*3/uL (ref 4.0–10.5)
nRBC: 0 % (ref 0.0–0.2)

## 2023-03-10 LAB — GLUCOSE, CAPILLARY
Glucose-Capillary: 169 mg/dL — ABNORMAL HIGH (ref 70–99)
Glucose-Capillary: 130 mg/dL — ABNORMAL HIGH (ref 70–99)
Glucose-Capillary: 156 mg/dL — ABNORMAL HIGH (ref 70–99)
Glucose-Capillary: 170 mg/dL — ABNORMAL HIGH (ref 70–99)
Glucose-Capillary: 198 mg/dL — ABNORMAL HIGH (ref 70–99)

## 2023-03-10 NOTE — Plan of Care (Signed)
  Problem: Education: Goal: Knowledge of General Education information will improve Description: Including pain rating scale, medication(s)/side effects and non-pharmacologic comfort measures Outcome: Not Progressing   Problem: Health Behavior/Discharge Planning: Goal: Ability to manage health-related needs will improve Outcome: Not Progressing   

## 2023-03-10 NOTE — Treatment Plan (Signed)
Patient made NPO at [facility] in anticipation of re-TURBT tomorrow, 03/11/23, with Dr. Berneice Heinrich. Continue IV abx as scheduled through procedure.   Anticipate patient will be cleared for discharge tomorrow following procedure.

## 2023-03-10 NOTE — Progress Notes (Signed)
PROGRESS NOTE  Diana Kennedy  DOB: 14-May-1956  PCP: Burna Sis ZOX:[id number]  DOA: 03/07/2023  LOS: 3 days  Hospital Day: 4  Brief narrative: Diana Kennedy is a 67 y.o. female with PMH significant for DM2, HTN, HLD, CAD, right subclavian artery stenosis, endometrial cancer s/p chemo/radiation, chronic anemia, hypothyroidism, peripheral neuropathy, anxiety/depression as well as bladder tumor for which she underwent TURBT and ureteral stent placement on 4/1. 6/10, patient presented to the ED with complaint of right flank pain and persistent intermittent hematuria. CT scan showed bilateral hydronephrosis with right ureteral stent in place, asymmetric bladder wall thickening and left UVJ concerning for recurrent infiltrative mass Admitted to [facility] Urology was consulted and recommended repeat cystoscopy, antibiotics for empiric treatment of UTI, and multimodal treatment of stent colic (Flomax, Oxybutynin, Tylenol, Oxycodone).   Subjective: Patient was seen and examined this morning.   Lying down in bed.  Not in distress.  No new symptoms.  Assessment and plan: Bladder tumor s/p TURBT and ureteral stent 4/1 Persistent hematuria Imaging as above showing bilateral hydronephrosis and recurrent infiltrative mass in left UVJ Urology following.  Plans to do repeat TURBT by Dr. Barnie Mort 6/14 Currently on IV antibiotics Also continue Flomax, oxybutynin Urine culture with multiple species present. Currently on IV Rocephin.  Pre-operative stratification Urologic surgery is associated with an low-intermediate (1-5%) cardiovascular risk for cardiac death and nonfatal MI The Detsky's Modified Cardiac Risk Index score is Class 1, with a low cardiac risk It is reasonable for the patient to go to the OR without additional evaluation  Chronic anemia Chronic iron and Vitamin B12 deficiency GERD Hemoglobin stable between 9 and 10 in last 24 hours.  Continue iron and vitamin B12  supplement. Continue PPI Recent Labs    01/08/23 0912 01/08/23 1756 03/07/23 1658 03/08/23 0137 03/08/23 0546 03/09/23 0416 03/10/23 0435  HGB 7.3*   < > 10.8* 9.5* 9.6* 9.5* 9.5*  MCV 98.7   < > 95.7  --  96.9 98.1 100.0  VITAMINB12 145*  --   --   --   --   --   --   FOLATE 8.3  --   --   --   --   --   --   FERRITIN 29  --   --   --   --   --   --   TIBC 451*  --   --   --   --   --   --   IRON 22*  --   --   --   --   --   --    < > = values in this interval not displayed.     Type 2 diabetes mellitus uncontrolled A1c 9 on 02/28/2023 PTA does not seem to be on any antidiabetic meds Currently on sliding scale insulin Accu-Cheks.  Blood sugar level is controlled with it.. Recent Labs  Lab 03/09/23 2035 03/09/23 2357 03/10/23 0427 03/10/23 0748 03/10/23 1148  GLUCAP 196* 195* 169* 130* 156*    Hypothyroidism TSH 31.184 but free T4 low at 0.31 Continue Synthroid.   Endometrial cancer  s/p hysterectomy and chemo/rads in 2020 with vaginal recurrence treated with radiation in 2022-2023 and additional local/vaginal recurrence treated in fall of 2023 with additional radiation Followed at [facility], [place], last seen in 11/2022 Negative cuff pap at that time Followed q3 months   Cystic lesion on body of pancreas Seen on CT in April Needs f/u MRCP in 1 year  AAA  3.3 cm Continue to follow-up as an outpatient for ultrasound abdomen every 6 months    Anxiety Continue hydroxyzine   Chronic pain Xtampza, pregabalin, and oxy  Obesity  Body mass index is 34.08 kg/m. Patient has been advised to make an attempt to improve diet and exercise patterns to aid in weight loss.   Mobility: Encourage ambulation  Goals of care   Code Status: Full Code    DVT prophylaxis:  SCDs Start: 03/07/23 2220   Antimicrobials: IV Rocephin Fluid: Can DC IV fluid today. Consultants: Urology Family Communication: None at bedside  Status: Inpatient Level of care:  Med-Surg   Patient from:  Home Anticipated d/c to: Pending clinical course Needs to continue in-hospital care:  Pending OR on 6/14    Diet:  Diet Order             Diet NPO time specified  Diet effective midnight           Diet Carb Modified Fluid consistency: Thin; Room service appropriate? Yes  Diet effective now                   Scheduled Meds:  insulin aspart  0-9 Units Subcutaneous Q4H   levothyroxine  125 mcg Oral Q0600   oxyCODONE  15 mg Oral Q12H   pantoprazole  40 mg Oral Daily   pregabalin  200 mg Oral TID   senna  2 tablet Oral QHS    PRN meds: acetaminophen **OR** acetaminophen, albuterol, fentaNYL (SUBLIMAZE) injection, hydrOXYzine, ondansetron **OR** ondansetron (ZOFRAN) IV, oxybutynin, oxyCODONE, polyethylene glycol   Infusions:   sodium chloride 125 mL/hr at 03/10/23 0759   cefTRIAXone (ROCEPHIN)  IV 1 g (03/09/23 2105)    Antimicrobials: Anti-infectives (From admission, onward)    Start     Dose/Rate Route Frequency Ordered Stop   03/08/23 2000  cefTRIAXone (ROCEPHIN) 1 g in sodium chloride 0.9 % 100 mL IVPB        1 g 200 mL/hr over 30 Minutes Intravenous Every 24 hours 03/08/23 0337     03/07/23 2030  cefTRIAXone (ROCEPHIN) 1 g in sodium chloride 0.9 % 100 mL IVPB        1 g 200 mL/hr over 30 Minutes Intravenous  Once 03/07/23 2019 03/07/23 2213       Nutritional status:  Body mass index is 34.08 kg/m.          Objective: Vitals:   03/10/23 0422 03/10/23 1150  BP: 106/60 120/63  Pulse: 66 64  Resp: 16 17  Temp: 98 F (36.7 C) 97.7 F (36.5 C)  SpO2: 93% 97%    Intake/Output Summary (Last 24 hours) at 03/10/2023 1403 Last data filed at 03/10/2023 0600 Gross per 24 hour  Intake 4861.11 ml  Output 1300 ml  Net 3561.11 ml    Filed Weights   03/08/23 0500 03/09/23 0400 03/10/23 0431  Weight: 88.8 kg 88.2 kg 92.9 kg   Weight change: 4.7 kg Body mass index is 34.08 kg/m.   Physical Exam: General exam: Pleasant, elderly Caucasian female.  Not  in physical distress Skin: No rashes, lesions or ulcers. HEENT: Atraumatic, normocephalic, no obvious bleeding Lungs: Clear to auscultation bilaterally CVS: Regular rate and rhythm, no murmur GI/Abd soft, nontender, nondistended, blood sound present CNS: Alert, awake, oriented x 3 Psychiatry: Mood appropriate Extremities: No pedal edema, no calf tenderness  Data Review: I have personally reviewed the laboratory data and studies available.  F/u labs ordered Unresulted Labs (From admission,  onward)    None       Total time spent in review of labs and imaging, patient evaluation, formulation of plan, documentation and communication with family: 35 minutes  Signed, Lorin Glass, MD [facility] 03/10/2023

## 2023-03-10 NOTE — Progress Notes (Signed)
Mobility Specialist - Progress Note   03/10/23 1357  Mobility  Activity Ambulated independently in hallway  Level of Assistance Standby assist, set-up cues, supervision of patient - no hands on  Assistive Device None  Distance Ambulated (ft) 350 ft  Range of Motion/Exercises Active  Activity Response Tolerated well  Mobility Referral Yes  $Mobility charge 1 Mobility  Mobility Specialist Start Time (ACUTE ONLY) 1349  Mobility Specialist Stop Time (ACUTE ONLY) 1357  Mobility Specialist Time Calculation (min) (ACUTE ONLY) 8 min   Pt was found in bed and agreeable to ambulate. Stated having some swelling on her hands and feet. At EOS returned to bed with all needs met. Call bell in reach.  Diana Kennedy Mobility Specialist

## 2023-03-11 ENCOUNTER — Inpatient Hospital Stay (HOSPITAL_COMMUNITY): Payer: Medicare Other

## 2023-03-11 ENCOUNTER — Inpatient Hospital Stay (HOSPITAL_COMMUNITY): Payer: Medicare Other | Admitting: Physician Assistant

## 2023-03-11 ENCOUNTER — Ambulatory Visit (HOSPITAL_COMMUNITY): Admission: RE | Admit: 2023-03-11 | Payer: Medicare Other | Source: Ambulatory Visit | Admitting: Urology

## 2023-03-11 ENCOUNTER — Encounter (HOSPITAL_COMMUNITY): Payer: Self-pay | Admitting: Internal Medicine

## 2023-03-11 ENCOUNTER — Inpatient Hospital Stay (HOSPITAL_COMMUNITY): Payer: Medicare Other | Admitting: Certified Registered Nurse Anesthetist

## 2023-03-11 ENCOUNTER — Encounter (HOSPITAL_COMMUNITY)
Admission: EM | Disposition: A | Discharge status: Home / Self Care | Payer: Self-pay | Source: Home / Self Care | Attending: Internal Medicine

## 2023-03-11 DIAGNOSIS — I10 Essential (primary) hypertension: Secondary | ICD-10-CM

## 2023-03-11 DIAGNOSIS — C679 Malignant neoplasm of bladder, unspecified: Secondary | ICD-10-CM | POA: Diagnosis not present

## 2023-03-11 DIAGNOSIS — N1330 Unspecified hydronephrosis: Secondary | ICD-10-CM | POA: Diagnosis not present

## 2023-03-11 DIAGNOSIS — N200 Calculus of kidney: Secondary | ICD-10-CM

## 2023-03-11 DIAGNOSIS — E119 Type 2 diabetes mellitus without complications: Secondary | ICD-10-CM

## 2023-03-11 DIAGNOSIS — I2510 Atherosclerotic heart disease of native coronary artery without angina pectoris: Secondary | ICD-10-CM

## 2023-03-11 DIAGNOSIS — D649 Anemia, unspecified: Secondary | ICD-10-CM

## 2023-03-11 DIAGNOSIS — Z01818 Encounter for other preprocedural examination: Secondary | ICD-10-CM

## 2023-03-11 DIAGNOSIS — Z87891 Personal history of nicotine dependence: Secondary | ICD-10-CM

## 2023-03-11 HISTORY — DX: Calculus of kidney: N20.0

## 2023-03-11 HISTORY — DX: Malignant neoplasm of bladder, unspecified: C67.9

## 2023-03-11 HISTORY — DX: Anxiety disorder, unspecified: F41.9

## 2023-03-11 HISTORY — PX: HOLMIUM LASER APPLICATION: SHX5852

## 2023-03-11 HISTORY — PX: CYSTOSCOPY WITH RETROGRADE PYELOGRAM, URETEROSCOPY AND STENT PLACEMENT: SHX5789

## 2023-03-11 HISTORY — DX: Type 2 diabetes mellitus without complications: E11.9

## 2023-03-11 HISTORY — DX: Complete loss of teeth, unspecified cause, unspecified class: K08.109

## 2023-03-11 HISTORY — PX: TRANSURETHRAL RESECTION OF BLADDER TUMOR: SHX2575

## 2023-03-11 HISTORY — DX: Anemia, unspecified: D64.9

## 2023-03-11 HISTORY — DX: Polyneuropathy, unspecified: G62.9

## 2023-03-11 HISTORY — DX: Gastro-esophageal reflux disease without esophagitis: K21.9

## 2023-03-11 HISTORY — DX: Presence of dental prosthetic device (complete) (partial): Z97.2

## 2023-03-11 LAB — GLUCOSE, CAPILLARY
Glucose-Capillary: 112 mg/dL — ABNORMAL HIGH (ref 70–99)
Glucose-Capillary: 120 mg/dL — ABNORMAL HIGH (ref 70–99)
Glucose-Capillary: 125 mg/dL — ABNORMAL HIGH (ref 70–99)
Glucose-Capillary: 133 mg/dL — ABNORMAL HIGH (ref 70–99)
Glucose-Capillary: 155 mg/dL — ABNORMAL HIGH (ref 70–99)
Glucose-Capillary: 193 mg/dL — ABNORMAL HIGH (ref 70–99)

## 2023-03-11 SURGERY — TURBT (TRANSURETHRAL RESECTION OF BLADDER TUMOR)
Anesthesia: General | Laterality: Right

## 2023-03-11 MED ORDER — HYDROMORPHONE HCL 1 MG/ML IJ SOLN
INTRAMUSCULAR | Status: AC
  Administered 2023-03-11: 0.5 mg via INTRAVENOUS
  Filled 2023-03-11: qty 1

## 2023-03-11 MED ORDER — PROPOFOL 500 MG/50ML IV EMUL
INTRAVENOUS | Status: DC | PRN
  Administered 2023-03-11: 120 mg via INTRAVENOUS

## 2023-03-11 MED ORDER — MIDAZOLAM HCL 2 MG/2ML IJ SOLN
INTRAMUSCULAR | Status: AC
  Filled 2023-03-11: qty 2

## 2023-03-11 MED ORDER — ONDANSETRON HCL 4 MG/2ML IJ SOLN
INTRAMUSCULAR | Status: DC | PRN
  Administered 2023-03-11: 4 mg via INTRAVENOUS

## 2023-03-11 MED ORDER — ONDANSETRON HCL 4 MG/2ML IJ SOLN: 4.0000 mg | Freq: Once | INTRAMUSCULAR | Status: DC | PRN

## 2023-03-11 MED ORDER — FENTANYL CITRATE (PF) 100 MCG/2ML IJ SOLN
INTRAMUSCULAR | Status: DC | PRN
  Administered 2023-03-11 (×2): 50 ug via INTRAVENOUS

## 2023-03-11 MED ORDER — OXYCODONE HCL 5 MG/5ML PO SOLN: 5.0000 mg | Freq: Once | ORAL | Status: DC | PRN

## 2023-03-11 MED ORDER — CHLORHEXIDINE GLUCONATE 0.12 % MT SOLN
15.0000 mL | Freq: Once | OROMUCOSAL | Status: AC
  Administered 2023-03-11: 15 mL via OROMUCOSAL

## 2023-03-11 MED ORDER — DEXTROSE 5 % IV SOLN
5.0000 mg/kg | INTRAVENOUS | Status: DC
Start: 2023-03-11 — End: 2023-03-11

## 2023-03-11 MED ORDER — PHENYLEPHRINE 80 MCG/ML (10ML) SYRINGE FOR IV PUSH (FOR BLOOD PRESSURE SUPPORT)
PREFILLED_SYRINGE | INTRAVENOUS | Status: DC | PRN
  Administered 2023-03-11 (×2): 80 ug via INTRAVENOUS

## 2023-03-11 MED ORDER — GENTAMICIN SULFATE 40 MG/ML IJ SOLN
5.0000 mg/kg | INTRAVENOUS | Status: DC
  Filled 2023-03-11: qty 8.75

## 2023-03-11 MED ORDER — MIDAZOLAM HCL 2 MG/2ML IJ SOLN
INTRAMUSCULAR | Status: DC | PRN
  Administered 2023-03-11 (×2): 1 mg via INTRAVENOUS

## 2023-03-11 MED ORDER — DEXAMETHASONE SODIUM PHOSPHATE 10 MG/ML IJ SOLN
INTRAMUSCULAR | Status: DC | PRN
  Administered 2023-03-11: 4 mg via INTRAVENOUS

## 2023-03-11 MED ORDER — DEXAMETHASONE SODIUM PHOSPHATE 10 MG/ML IJ SOLN
INTRAMUSCULAR | Status: AC
  Filled 2023-03-11: qty 1

## 2023-03-11 MED ORDER — LACTATED RINGERS IV SOLN: INTRAVENOUS | Status: DC

## 2023-03-11 MED ORDER — LIDOCAINE 2% (20 MG/ML) 5 ML SYRINGE
INTRAMUSCULAR | Status: DC | PRN
  Administered 2023-03-11: 80 mg via INTRAVENOUS

## 2023-03-11 MED ORDER — AMISULPRIDE (ANTIEMETIC) 5 MG/2ML IV SOLN: 10.0000 mg | Freq: Once | INTRAVENOUS | Status: DC | PRN

## 2023-03-11 MED ORDER — IOHEXOL 300 MG/ML  SOLN
INTRAMUSCULAR | Status: DC | PRN
  Administered 2023-03-11: 30 mL

## 2023-03-11 MED ORDER — OXYCODONE HCL 5 MG PO TABS: 5.0000 mg | ORAL_TABLET | Freq: Once | ORAL | Status: DC | PRN

## 2023-03-11 MED ORDER — PHENYLEPHRINE HCL-NACL 20-0.9 MG/250ML-% IV SOLN
INTRAVENOUS | Status: DC | PRN
  Administered 2023-03-11: 30 ug/min via INTRAVENOUS

## 2023-03-11 MED ORDER — ROCURONIUM BROMIDE 10 MG/ML (PF) SYRINGE
PREFILLED_SYRINGE | INTRAVENOUS | Status: AC
  Filled 2023-03-11: qty 10

## 2023-03-11 MED ORDER — HYDROMORPHONE HCL 1 MG/ML IJ SOLN
0.2500 mg | INTRAMUSCULAR | Status: DC | PRN
  Administered 2023-03-11: 0.5 mg via INTRAVENOUS

## 2023-03-11 MED ORDER — EPHEDRINE 5 MG/ML INJ
INTRAVENOUS | Status: AC
  Filled 2023-03-11: qty 5

## 2023-03-11 MED ORDER — PROPOFOL 10 MG/ML IV BOLUS
INTRAVENOUS | Status: AC
  Filled 2023-03-11: qty 20

## 2023-03-11 MED ORDER — ONDANSETRON HCL 4 MG/2ML IJ SOLN
INTRAMUSCULAR | Status: AC
  Filled 2023-03-11: qty 2

## 2023-03-11 MED ORDER — EPHEDRINE SULFATE-NACL 50-0.9 MG/10ML-% IV SOSY
PREFILLED_SYRINGE | INTRAVENOUS | Status: DC | PRN
  Administered 2023-03-11 (×3): 5 mg via INTRAVENOUS

## 2023-03-11 MED ORDER — ALBUMIN HUMAN 5 % IV SOLN
INTRAVENOUS | Status: AC
  Filled 2023-03-11: qty 250

## 2023-03-11 MED ORDER — FENTANYL CITRATE (PF) 100 MCG/2ML IJ SOLN
INTRAMUSCULAR | Status: AC
  Filled 2023-03-11: qty 2

## 2023-03-11 SURGICAL SUPPLY — 35 items
BAG DRN RND TRDRP ANRFLXCHMBR (UROLOGICAL SUPPLIES)
BAG URINE DRAIN 2000ML AR STRL (UROLOGICAL SUPPLIES) IMPLANT
BAG URO CATCHER STRL LF (MISCELLANEOUS) ×2 IMPLANT
BASKET LASER NITINOL 1.9FR (BASKET) IMPLANT
BSKT STON RTRVL 120 1.9FR (BASKET) ×2
CATH URETL OPEN END 6FR 70 (CATHETERS) ×2 IMPLANT
CLOTH BEACON ORANGE TIMEOUT ST (SAFETY) ×2 IMPLANT
DRAPE FOOT SWITCH (DRAPES) ×2 IMPLANT
ELECT REM PT RETURN 15FT ADLT (MISCELLANEOUS) ×2 IMPLANT
EVACUATOR MICROVAS BLADDER (UROLOGICAL SUPPLIES) IMPLANT
EXTRACTOR STONE 1.7FRX115CM (UROLOGICAL SUPPLIES) IMPLANT
GLOVE SURG LX STRL 7.5 STRW (GLOVE) ×2 IMPLANT
GOWN SRG XL LVL 4 BRTHBL STRL (GOWNS) ×2 IMPLANT
GOWN STRL NON-REIN XL LVL4 (GOWNS) ×2
GOWN STRL REUS W/ TWL XL LVL3 (GOWN DISPOSABLE) ×2 IMPLANT
GOWN STRL REUS W/TWL XL LVL3 (GOWN DISPOSABLE) ×2
GUIDEWIRE ANG ZIPWIRE 038X150 (WIRE) ×2 IMPLANT
GUIDEWIRE STR DUAL SENSOR (WIRE) ×2 IMPLANT
KIT TURNOVER KIT A (KITS) IMPLANT
LASER FIB FLEXIVA PULSE ID 365 (Laser) IMPLANT
LASER FIB FLEXIVA PULSE ID 550 (Laser) IMPLANT
LASER FIB FLEXIVA PULSE ID 910 (Laser) IMPLANT
LOOP CUT BIPOLAR 24F LRG (ELECTROSURGICAL) IMPLANT
MANIFOLD NEPTUNE II (INSTRUMENTS) ×2 IMPLANT
PACK CYSTO (CUSTOM PROCEDURE TRAY) ×2 IMPLANT
SHEATH NAVIGATOR HD 11/13X28 (SHEATH) IMPLANT
SHEATH NAVIGATOR HD 11/13X36 (SHEATH) IMPLANT
STENT POLARIS 5FRX24 (STENTS) IMPLANT
SYR TOOMEY IRRIG 70ML (MISCELLANEOUS)
SYRINGE TOOMEY IRRIG 70ML (MISCELLANEOUS) IMPLANT
TRACTIP FLEXIVA PULS ID 200XHI (Laser) IMPLANT
TRACTIP FLEXIVA PULSE ID 200 (Laser)
TUBE PU 8FR 16IN ENFIT (TUBING) ×2 IMPLANT
TUBING CONNECTING 10 (TUBING) ×2 IMPLANT
TUBING UROLOGY SET (TUBING) ×2 IMPLANT

## 2023-03-11 NOTE — Progress Notes (Signed)
   Subjective/Chief Complaint:  1 - High Grade Superficial Bladder Cancer - small T1G3 tumor by ugent TURBT 12/2022 on eval hemtauria with hemodynaimcly significant bleeding. She has h/o metastaic endometrial cancer with extensive pelvic radiation. CT 12/2022 localized.   2 - Right Renal Stone - 12mm Rt renal stone incidetnal on imaging x several w/o sig hydro. Rt stent placed at time of TURBT 12/2022.   PMH sig for metastatic / recurrent endometrial cancer (TAH,BSO, chemo, XRT, then vaginal cuff brach for cuff recurrence managed by Lia Foyer MD at [facility]), L spine fusion, obestiy, DM2 (A1c 7s), CAD/Stent, Carotid bypass.   Today "Diana Kennedy" is seen ready for restaging transurethral resection of bladder tumor and Rt ureteroscopy for large stone. Mild left caliectasis without delayed nephrogram also noted on intake CT. Most recent UCX 6/10 non-clonal. No interval fevers.    Objective: Vital signs in last 24 hours: Temp:  [97.6 F (36.4 C)-98.2 F (36.8 C)] 97.6 F (36.4 C) (06/14 0409) Pulse Rate:  [61-65] 61 (06/14 0409) Resp:  [17-19] 18 (06/14 0409) BP: (112-120)/(58-68) 117/58 (06/14 0409) SpO2:  [96 %-97 %] 96 % (06/14 0409) Weight:  [89.3 kg] 89.3 kg (06/14 0409) Last BM Date : 03/07/23  Intake/Output from previous day: 06/13 0701 - 06/14 0700 In: 360 [P.O.:360] Out: 1400 [Urine:1400] Intake/Output this shift: Total I/O In: -  Out: 500 [Urine:500]  NAD, daughter at bedside Non=laboered breathign RRR SNTND No c/c/e  Lab Results:  Recent Labs    03/09/23 0416 03/10/23 0435  WBC 5.6 5.9  HGB 9.5* 9.5*  HCT 31.5* 32.3*  PLT 134* 125*   BMET Recent Labs    03/09/23 0416 03/10/23 0435  NA 140 140  K 3.9 4.1  CL 107 109  CO2 25 24  GLUCOSE 143* 172*  BUN 18 16  CREATININE 0.92 0.93  CALCIUM 7.5* 7.8*   PT/INR No results for input(s): "LABPROT", "INR" in the last 72 hours. ABG No results for input(s): "PHART", "HCO3" in the last 72 hours.  Invalid  input(s): "PCO2", "PO2"  Studies/Results: No results found.  Anti-infectives: Anti-infectives (From admission, onward)    Start     Dose/Rate Route Frequency Ordered Stop   03/08/23 2000  cefTRIAXone (ROCEPHIN) 1 g in sodium chloride 0.9 % 100 mL IVPB        1 g 200 mL/hr over 30 Minutes Intravenous Every 24 hours 03/08/23 0337     03/07/23 2030  cefTRIAXone (ROCEPHIN) 1 g in sodium chloride 0.9 % 100 mL IVPB        1 g 200 mL/hr over 30 Minutes Intravenous  Once 03/07/23 2019 03/07/23 2213       Assessment/Plan:  Proceed as planned with TURBT, bilateral retrogrades, Rt ureteroscopy / stent exchange with goal of re-stagign bladder tumor and adressing large Rt uretera stone and ruling out obsructive causes for left caliectaiss. Risks, benefits, alternatives, expected peri-op course discussed.    Loletta Parish. 03/11/2023

## 2023-03-11 NOTE — Anesthesia Procedure Notes (Signed)
Procedure Name: LMA Insertion Date/Time: 03/11/2023 4:49 PM  Performed by: Epimenio Sarin, CRNAPre-anesthesia Checklist: Patient identified, Emergency Drugs available, Suction available, Patient being monitored and Timeout performed Patient Re-evaluated:Patient Re-evaluated prior to induction Oxygen Delivery Method: Circle system utilized Preoxygenation: Pre-oxygenation with 100% oxygen Induction Type: IV induction Ventilation: Mask ventilation without difficulty LMA: LMA with gastric port inserted LMA Size: 4.0 Number of attempts: 1 Dental Injury: Teeth and Oropharynx as per pre-operative assessment

## 2023-03-11 NOTE — Transfer of Care (Signed)
Immediate Anesthesia Transfer of Care Note  Patient: Diana Kennedy  Procedure(s) Performed: TRANSURETHRAL RESECTION OF BLADDER TUMOR (TURBT) CYSTOSCOPY WITH RETROGRADE PYELOGRAM, URETEROSCOPY AND STENT EXCHANGE (Right) HOLMIUM LASER APPLICATION (Right)  Patient Location: PACU  Anesthesia Type:General  Level of Consciousness: drowsy and patient cooperative  Airway & Oxygen Therapy: Patient Spontanous Breathing and Patient connected to face mask oxygen  Post-op Assessment: Report given to RN and Post -op Vital signs reviewed and stable  Post vital signs: Reviewed and stable  Last Vitals:  Vitals Value Taken Time  BP 128/75 03/11/23 1812  Temp    Pulse 78 03/11/23 1814  Resp 11 03/11/23 1814  SpO2 100 % 03/11/23 1814  Vitals shown include unvalidated device data.  Last Pain:  Vitals:   03/11/23 1326  TempSrc: Oral  PainSc: 0-No pain      Patients Stated Pain Goal: 0 (03/10/23 0919)  Complications: No notable events documented.

## 2023-03-11 NOTE — Progress Notes (Signed)
Report received from KP, nurse in PACU. Will bring pt up after shift change.

## 2023-03-11 NOTE — Progress Notes (Signed)
Mobility Specialist - Progress Note   03/11/23 0907  Mobility  Activity Ambulated independently in hallway  Level of Assistance Independent  Assistive Device None  Distance Ambulated (ft) 350 ft  Range of Motion/Exercises Active  Activity Response Tolerated well  Mobility Referral Yes  $Mobility charge 1 Mobility  Mobility Specialist Start Time (ACUTE ONLY) 0859  Mobility Specialist Stop Time (ACUTE ONLY) 0906  Mobility Specialist Time Calculation (min) (ACUTE ONLY) 7 min   Pt was found in bed and agreeable to ambulate. C/o of swelling on her hands and feet. At EOS returned to bed with all needs met.  Billey Chang Mobility Specialist

## 2023-03-11 NOTE — Progress Notes (Signed)
Family in room expressed concerns r/t how pt is doing. Called short stay. Stated pt did not go back to surgery until 1630-1700. Updated family, understanding of situation.

## 2023-03-11 NOTE — Progress Notes (Signed)
PROGRESS NOTE  Diana Kennedy  DOB: 02/20/1956  PCP: Burna Sis WJX:[id number]  DOA: 03/07/2023  LOS: 4 days  Hospital Day: 5  Brief narrative: Diana Kennedy is a 67 y.o. female with PMH significant for DM2, HTN, HLD, CAD, right subclavian artery stenosis, endometrial cancer s/p chemo/radiation, chronic anemia, hypothyroidism, peripheral neuropathy, anxiety/depression as well as bladder tumor for which she underwent TURBT and ureteral stent placement on 4/1. 6/10, patient presented to the ED with complaint of right flank pain and persistent intermittent hematuria. CT scan showed bilateral hydronephrosis with right ureteral stent in place, asymmetric bladder wall thickening and left UVJ concerning for recurrent infiltrative mass Admitted to [facility] Urology was consulted and recommended repeat cystoscopy, antibiotics for empiric treatment of UTI, and multimodal treatment of stent colic (Flomax, Oxybutynin, Tylenol, Oxycodone).   Subjective: Patient was seen and examined this morning.   Lying down on bed.  Not in distress.  Waiting for surgery.  Assessment and plan: Bladder tumor s/p TURBT and ureteral stent 4/1 Persistent hematuria Imaging as above showing bilateral hydronephrosis and recurrent infiltrative mass in left UVJ Urology following.  Plans to do repeat TURBT by Dr. Barnie Mort 6/14*** Currently on IV ceftriaxone Also continue Flomax, oxybutynin Urine culture with multiple species present.  Chronic anemia Chronic iron and Vitamin B12 deficiency GERD Hemoglobin stable between 9 and 10 in last 24 hours.  Continue iron and vitamin B12 supplement. Continue PPI Recent Labs    01/08/23 0912 01/08/23 1756 03/07/23 1658 03/08/23 0137 03/08/23 0546 03/09/23 0416 03/10/23 0435  HGB 7.3*   < > 10.8* 9.5* 9.6* 9.5* 9.5*  MCV 98.7   < > 95.7  --  96.9 98.1 100.0  VITAMINB12 145*  --   --   --   --   --   --   FOLATE 8.3  --   --   --   --   --   --   FERRITIN 29  --   --    --   --   --   --   TIBC 451*  --   --   --   --   --   --   IRON 22*  --   --   --   --   --   --    < > = values in this interval not displayed.    Type 2 diabetes mellitus uncontrolled A1c 9 on 02/28/2023 PTA does not seem to be on any antidiabetic meds Currently on sliding scale insulin Accu-Cheks.  Blood sugar level is controlled with it.. Plan to start glipizide and metformin at discharge.*** Recent Labs  Lab 03/10/23 2043 03/11/23 0002 03/11/23 0408 03/11/23 0751 03/11/23 1149  GLUCAP 198* 155* 133* 112* 120*    Hypothyroidism TSH 31.184 but free T4 low at 0.31 Continue Synthroid.   Endometrial cancer  s/p hysterectomy and chemo/rads in 2020 with vaginal recurrence treated with radiation in 2022-2023 and additional local/vaginal recurrence treated in fall of 2023 with additional radiation Followed at [facility], last seen in 11/2022 Negative cuff pap at that time Followed q3 months   Cystic lesion on body of pancreas Seen on CT in April Needs f/u MRCP in 1 year   AAA  3.3 cm Continue to follow-up as an outpatient for ultrasound abdomen every 6 months    Anxiety Continue hydroxyzine   Chronic pain Xtampza, pregabalin, and oxy  Obesity  Body mass index is 32.76 kg/m. Patient has been advised to  make an attempt to improve diet and exercise patterns to aid in weight loss.   Mobility: Encourage ambulation  Goals of care   Code Status: Full Code    DVT prophylaxis:  SCDs Start: 03/07/23 2220   Antimicrobials: IV Rocephin Fluid: Can DC IV fluid today. Consultants: Urology Family Communication: None at bedside  Status: Inpatient Level of care:  Med-Surg   Patient from: Home Anticipated d/c to: Pending clinical course Needs to continue in-hospital care:  Pending OR on 6/14    Diet:  Diet Order             Diet NPO time specified  Diet effective midnight           Diet NPO time specified  Diet effective midnight                   Scheduled  Meds:  insulin aspart  0-9 Units Subcutaneous Q4H   levothyroxine  125 mcg Oral Q0600   oxyCODONE  15 mg Oral Q12H   pantoprazole  40 mg Oral Daily   pregabalin  200 mg Oral TID   senna  2 tablet Oral QHS    PRN meds: acetaminophen **OR** acetaminophen, albuterol, fentaNYL (SUBLIMAZE) injection, hydrOXYzine, ondansetron **OR** ondansetron (ZOFRAN) IV, oxybutynin, oxyCODONE, polyethylene glycol   Infusions:   cefTRIAXone (ROCEPHIN)  IV 1 g (03/10/23 2137)   gentamicin     lactated ringers      Antimicrobials: Anti-infectives (From admission, onward)    Start     Dose/Rate Route Frequency Ordered Stop   03/11/23 1300  gentamicin (GARAMYCIN) 350 mg in dextrose 5 % 100 mL IVPB        5 mg/kg  69.9 kg (Adjusted) 108.8 mL/hr over 60 Minutes Intravenous 30 min pre-op 03/11/23 1124     03/11/23 1113  gentamicin (GARAMYCIN) 440 mg in dextrose 5 % 100 mL IVPB  Status:  Discontinued        5 mg/kg  88.5 kg 111 mL/hr over 60 Minutes Intravenous 30 min pre-op 03/11/23 1113 03/11/23 1124   03/08/23 2000  cefTRIAXone (ROCEPHIN) 1 g in sodium chloride 0.9 % 100 mL IVPB        1 g 200 mL/hr over 30 Minutes Intravenous Every 24 hours 03/08/23 0337     03/07/23 2030  cefTRIAXone (ROCEPHIN) 1 g in sodium chloride 0.9 % 100 mL IVPB        1 g 200 mL/hr over 30 Minutes Intravenous  Once 03/07/23 2019 03/07/23 2213       Nutritional status:  Body mass index is 32.76 kg/m.       {Tip this will not be part of the note when signed Body mass index is 32.76 kg/m. , ,  (Optional):26781}   Objective: Vitals:   03/10/23 2040 03/11/23 0409  BP: 112/68 (!) 117/58  Pulse: 65 61  Resp: 19 18  Temp: 98.2 F (36.8 C) 97.6 F (36.4 C)  SpO2: 96% 96%    Intake/Output Summary (Last 24 hours) at 03/11/2023 1248 Last data filed at 03/11/2023 1034 Gross per 24 hour  Intake 360 ml  Output 2400 ml  Net -2040 ml    Filed Weights   03/09/23 0400 03/10/23 0431 03/11/23 0409  Weight: 88.2 kg  92.9 kg 89.3 kg   Weight change: -3.6 kg Body mass index is 32.76 kg/m.   Physical Exam: General exam: Pleasant, elderly Caucasian female.  Not in physical distress Skin: No rashes, lesions or ulcers. HEENT: Atraumatic,  normocephalic, no obvious bleeding Lungs: Clear to auscultation bilaterally CVS: Regular rate and rhythm, no murmur GI/Abd soft, nontender, nondistended, blood sound present CNS: Alert, awake, oriented x 3 Psychiatry: Mood appropriate Extremities: No pedal edema, no calf tenderness  Data Review: I have personally reviewed the laboratory data and studies available.  F/u labs ordered Unresulted Labs (From admission, onward)    None       Total time spent in review of labs and imaging, patient evaluation, formulation of plan, documentation and communication with family: 45 minutes  Signed, Lorin Glass, MD [facility] 03/11/2023

## 2023-03-11 NOTE — Brief Op Note (Signed)
03/11/2023  6:05 PM  PATIENT:  Diana Kennedy  66 y.o. female  PRE-OPERATIVE DIAGNOSIS:  RENAL STONE, BLADDER CANCER  POST-OPERATIVE DIAGNOSIS:  RENAL STONE, BLADDER CANCER  PROCEDURE:  Procedure(s): TRANSURETHRAL RESECTION OF BLADDER TUMOR (TURBT) (N/A) CYSTOSCOPY WITH RETROGRADE PYELOGRAM, URETEROSCOPY AND STENT EXCHANGE (Right) HOLMIUM LASER APPLICATION (Right)  SURGEON:  Surgeon(s) and Role:    * Izen Petz, Delbert Phenix., MD - Primary  PHYSICIAN ASSISTANT:   ASSISTANTS: none   ANESTHESIA:   general  EBL:  0 mL   BLOOD ADMINISTERED:none  DRAINS: none   LOCAL MEDICATIONS USED:  NONE  SPECIMEN:  Source of Specimen:  Rt renal / ureteral stone fragments  ; old resection site  DISPOSITION OF SPECIMEN:  PATHOLOGY  COUNTS:  YES  TOURNIQUET:  * No tourniquets in log *  DICTATION: .Other Dictation: Dictation Number   [id number]  PLAN OF CARE: Admit to inpatient   PATIENT DISPOSITION:  PACU - hemodynamically stable.   Delay start of Pharmacological VTE agent (>24hrs) due to surgical blood loss or risk of bleeding: yes

## 2023-03-11 NOTE — Anesthesia Preprocedure Evaluation (Addendum)
Anesthesia Evaluation  Patient identified by MRN, date of birth, ID band Patient awake    Reviewed: Allergy & Precautions, NPO status , Patient's Chart, lab work & pertinent test results, reviewed documented beta blocker date and time   History of Anesthesia Complications Negative for: history of anesthetic complications  Airway Mallampati: II  TM Distance: >3 FB Neck ROM: Full    Dental  (+) Edentulous Upper, Edentulous Lower   Pulmonary Patient abstained from smoking., former smoker   Pulmonary exam normal breath sounds clear to auscultation       Cardiovascular hypertension, (-) angina + CAD, + Cardiac Stents, + CABG and + Peripheral Vascular Disease  Normal cardiovascular exam Rhythm:Regular Rate:Normal  '19 post CABG TEE:  Left ventricular systolic function is normal. Ejection Fraction = >55%. The right ventricle is normal in size and function. There is trace mitral regurgitation.     Neuro/Psych  PSYCHIATRIC DISORDERS Anxiety Depression    Peripheral neuropathy  Neuromuscular disease    GI/Hepatic Neg liver ROS,GERD  Medicated and Controlled,,  Endo/Other  diabetes, Well Controlled, Type 2, Oral Hypoglycemic AgentsHypothyroidism  Obesity  Renal/GU Renal diseaseHx/o hydronephrosis due to Bladder Ca   Bladder Ca    Musculoskeletal  (+)  Fibromyalgia -  Abdominal  (+) + obese  Peds  Hematology  (+) Blood dyscrasia (Hb 7.5, plt 112k), anemia Thrombocytopenia- Plt 125k   Anesthesia Other Findings   Reproductive/Obstetrics                              Anesthesia Physical Anesthesia Plan  ASA: 4  Anesthesia Plan: General   Post-op Pain Management: Tylenol PO (pre-op)*   Induction: Intravenous  PONV Risk Score and Plan: 2 and Ondansetron, Dexamethasone and Treatment may vary due to age or medical condition  Airway Management Planned: LMA  Additional Equipment: None  Intra-op  Plan:   Post-operative Plan: Extubation in OR  Informed Consent: I have reviewed the patients History and Physical, chart, labs and discussed the procedure including the risks, benefits and alternatives for the proposed anesthesia with the patient or authorized representative who has indicated his/her understanding and acceptance.       Plan Discussed with: CRNA and Anesthesiologist  Anesthesia Plan Comments:         Anesthesia Quick Evaluation

## 2023-03-11 NOTE — Op Note (Unsigned)
NAME: Diana Kennedy, NORDNESS MEDICAL RECORD NO: [id number] ACCOUNT NO: [id number] DATE OF BIRTH: 1955-10-21 FACILITY: [facility] LOCATION: [id number] PHYSICIAN: Sebastian Ache, MD  Operative Report   PREOPERATIVE DIAGNOSES:  Large right renal stone, bladder cancer, history of refractory hematuria, also history of advanced gynecologic cancer.  PROCEDURE PERFORMED: 1.  Cystoscopy with bilateral retrograde pyelograms, interpretation. 2.  Right ureteroscopy with laser lithotripsy and stent exchange. 3.  Restaging transurethral resection of bladder tumor, volume medium.  FINDINGS: 1.  Approximately 3 cm old resection site, left lateral wall.  No obvious recurrent gross tumor. 2.  No obvious filling defects on left retrograde pyelogram and some caliectasis that appears stable. 3.  Large right upper third ureteral stone. 4.  Complete resolution of all accessible stone fragments larger than one-third mm following laser lithotripsy and basket extraction on the right side. 5.  Successful replacement of right ureteral stent, proximal end in the renal pelvis, distal end in the bladder.  INDICATIONS:  The patient is a pleasant, but quite comorbid 67 year old lady with history of advanced cardiopulmonary disease as well as advanced gynecologic cancer.  She was found on workup of refractory and hemodynamically significant hematuria to have  small volume, but high-grade bladder cancer as well as large right renal stone.  She underwent transurethral resection of bladder tumor and right stenting in the semi-urgent setting at hospitalization approximately 2 months ago.  She was found to have  high-grade superficial bladder cancer at that time and the transurethral resection dramatically decreased her hematuria and her anemia has improved significantly.  She presents today as previously scheduled for definitive management of her right stone  with ureteroscopy and restaging transurethral resection of bladder tumor.  She was also  recently admitted with severe lower urinary tract symptoms, felt to be likely secondary to stent colic and that she is stabilized and surgery scheduled to be  acceptable.  Informed consent was obtained and placed in medical record.  PROCEDURE IN DETAIL:  The patient being Diana Kennedy verified and procedure being restaging transurethral resection of bladder tumor, bilateral retrogrades and right ureteroscopic stone manipulation was confirmed.  Procedure timeout was performed.   Intravenous antibiotics were administered.  General anesthesia was induced.  The patient was placed into a low lithotomy position.  Sterile field was created, prepped and draped the patient's vagina, introitus, and proximal thighs using iodine.   Cystourethroscopy was performed using 21-French rigid cystoscope with offset lens.  Inspection of the urinary bladder revealed old resection site in the left lateral bladder area approximately 3 cm.  There was significant fibrinous tissue in this area,  some mild edema, but no obvious papillary tumor and was quite favorable.  Fibrinous tissue was then irrigated for discard.  Distal end of right ureteral stent was also noted.  There was some left caliectasis with normal renal function noted on imaging  this admission.  Therefore, left retrograde pyelogram was obtained.  Left retrograde demonstrated single left ureter, single system left kidney.  There was some caliectasis that appeared to be stable and chronic, mostly physiologic as with peristalsis and the dilation essentially resolved. Fluoroscopic documentation  performed.  Next, the distal end of the right stent was grasped, was brought out in its entirety set aside for discard, inspected and intact and the right ureteral orifice was cannulated with 6-French open-ended catheter, and right retrograde pyelogram  was obtained.  Right retrograde pyelogram demonstrated single right ureter, single system right kidney.  There was a large  filling defect in the proximal  ureter consistent with known stone.  The ZIPwire was advanced in lower pole, set aside as safety wire.  An 8-French  feeding tube placed in the urinary bladder for pressure release.  Semirigid ureteroscopy was performed of the entire length right ureter alongside a separate sensor working wire.  A stone in question was not visualized, it was felt to likely represent a  retrograde positioning into the kidney.  As the goal today was to render stone free, the semirigid scope was exchanged for a short length ureteral access sheath to the level of proximal ureter using continuous fluoroscopic guidance over the sensor  working wire and flexible digital ureteroscopy performed of the proximal right ureter and inspection of the right kidney, including all calices x3.  As anticipated, the previous ureteral stone was retrograde positioned into a mid pole calix. Stone was  much too large for simple basketing.  Holmium laser energy was then applied to stone using settings of 0.2 joules and 20 Hz and using dusting technique, approximately 90% of the stone was dusted, remaining 10% fragmented with fragments being amenable to  simple basketing with Escape basket such that all fragments larger than one-third mm had been removed.  There was satisfactory fragments for analysis.  Access sheath was removed under continuous vision, no significant mucosal abnormalities found.  Given  the large stone volume, it is felt that replacement of the stent will be warranted and a 5 x 24 Polaris type stent was placed using fluoroscopic guidance.  Good proximal and distal plane were noted.  Attention was then directed at restaging transurethral  resection of bladder tumor.  Cystoscope was exchanged for a 26-French resectoscope sheath with visual obturator and using medium resectoscope loop, very careful resection was performed of the old resection site down to superficial fibromuscular stroma  of the urinary  bladder.  Notably, this was on the left side lateral to the left ureteral orifice.  The tissue fragments were irrigated and set aside for permanent pathology.  Given her lack of muscle invasion on prior resection with muscle in the  specimen, and in fact this was a fairly close to the ureteral orifice, I elected not to perform deep biopsies today given risks, benefits and analysis.  The base of the area was fulgurated.  Hemostasis was acceptable.  Bladder was partially emptied per  cystoscope.  Procedure was then terminated.  The patient tolerated the procedure well, no immediate periprocedural complications.  The patient taken to the postanesthesia care unit is stable condition.  Plan for likely discharge tomorrow pending her  clinical status.  She does have followup for stent removal in several weeks and path review.   NIK D: 03/11/2023 6:12:06 pm T: 03/11/2023 10:27:00 pm  JOB: [id number]/ [id number]

## 2023-03-11 NOTE — Anesthesia Postprocedure Evaluation (Signed)
Anesthesia Post Note  Patient: Diana Kennedy  Procedure(s) Performed: TRANSURETHRAL RESECTION OF BLADDER TUMOR (TURBT) CYSTOSCOPY WITH RETROGRADE PYELOGRAM, URETEROSCOPY AND STENT EXCHANGE (Right) HOLMIUM LASER APPLICATION (Right)     Patient location during evaluation: PACU Anesthesia Type: General Level of consciousness: awake and alert and oriented Pain management: pain level controlled Vital Signs Assessment: post-procedure vital signs reviewed and stable Respiratory status: spontaneous breathing, nonlabored ventilation and respiratory function stable Cardiovascular status: blood pressure returned to baseline and stable Postop Assessment: no apparent nausea or vomiting Anesthetic complications: no   No notable events documented.  Last Vitals:  Vitals:   03/11/23 1915 03/11/23 1948  BP: 139/74 (!) 159/82  Pulse: 69 69  Resp: 16 17  Temp: 36.7 C 36.5 C  SpO2: 96% 99%    Last Pain:  Vitals:   03/11/23 2013  TempSrc:   PainSc: 8                  Beryl Balz A.

## 2023-03-12 DIAGNOSIS — N1330 Unspecified hydronephrosis: Secondary | ICD-10-CM | POA: Diagnosis not present

## 2023-03-12 DIAGNOSIS — C679 Malignant neoplasm of bladder, unspecified: Secondary | ICD-10-CM | POA: Diagnosis not present

## 2023-03-12 LAB — GLUCOSE, CAPILLARY
Glucose-Capillary: 227 mg/dL — ABNORMAL HIGH (ref 70–99)
Glucose-Capillary: 163 mg/dL — ABNORMAL HIGH (ref 70–99)
Glucose-Capillary: 135 mg/dL — ABNORMAL HIGH (ref 70–99)
Glucose-Capillary: 186 mg/dL — ABNORMAL HIGH (ref 70–99)

## 2023-03-12 NOTE — Discharge Summary (Signed)
Physician Discharge Summary  Diana Kennedy UJW:[id number] DOB: Jun 15, 1956 DOA: 03/07/2023  PCP: Christia Reading, PA-C  Admit date: 03/07/2023 Discharge date: 03/12/2023  Admitted From: Home Discharge disposition: Home  Recommendations at discharge:  Follow-up with PCP for diabetes control Follow-up with urology as an outpatient.  Brief narrative: Diana Kennedy is a 67 y.o. female with PMH significant for DM2, HTN, HLD, CAD, right subclavian artery stenosis, endometrial cancer s/p chemo/radiation, chronic anemia, hypothyroidism, peripheral neuropathy, anxiety/depression as well as bladder tumor for which she underwent TURBT and ureteral stent placement on 4/1. 6/10, patient presented to the ED with complaint of right flank pain and persistent intermittent hematuria. CT scan showed bilateral hydronephrosis with right ureteral stent in place, asymmetric bladder wall thickening and left UVJ concerning for recurrent infiltrative mass Admitted to [facility] Urology was consulted and recommended repeat cystoscopy, antibiotics for empiric treatment of UTI, and multimodal treatment of stent colic (Flomax, Oxybutynin, Tylenol, Oxycodone).  6/14, underwent cystoscopy, bilateral retrograde pyelogram, right ureteroscopy with laser lithotripsy and stent exchange and restaging TURBT  Subjective: Patient was seen and examined this morning.   Lying on bed.  Not in distress.  Feels ready for surgery today.  Hospital course: Bladder tumor s/p TURBT and ureteral stent 4/1 Persistent hematuria Imaging as above showing bilateral hydronephrosis and recurrent infiltrative mass in left UVJ Urology consult obtained 6/14, underwent cystoscopy, bilateral retrograde pyelogram, right ureteroscopy with laser lithotripsy and stent exchange and restaging TURBT Overnight, no acute issues.  No hematuria. Treated with 5-day course of IV Rocephin. Continue oxybutynin as before.  Chronic anemia Chronic iron and Vitamin B12  deficiency GERD Hemoglobin stable between 9 and 10 in last 24 hours.  Continue iron and vitamin B12 supplement. Continue PPI Recent Labs    01/08/23 0912 01/08/23 1756 03/07/23 1658 03/08/23 0137 03/08/23 0546 03/09/23 0416 03/10/23 0435  HGB 7.3*   < > 10.8* 9.5* 9.6* 9.5* 9.5*  MCV 98.7   < > 95.7  --  96.9 98.1 100.0  VITAMINB12 145*  --   --   --   --   --   --   FOLATE 8.3  --   --   --   --   --   --   FERRITIN 29  --   --   --   --   --   --   TIBC 451*  --   --   --   --   --   --   IRON 22*  --   --   --   --   --   --    < > = values in this interval not displayed.   Type 2 diabetes mellitus uncontrolled A1c 9 on 02/28/2023 PTA does not seem to be on any antidiabetic meds At discharge, I offered the patient to be started on oral antidiabetic medicines.  She states he was on metformin before but she thinks it was stopped later for some reason which she is not able to remember.  I have advised her to follow-up with her primary doctor for reassessment and initiation of meds. Recent Labs  Lab 03/11/23 1813 03/11/23 2043 03/12/23 0151 03/12/23 0431 03/12/23 0729  GLUCAP 125* 193* 227* 163* 135*    AAA  3.3 cm in CT scan Continue to follow-up as an outpatient for ultrasound abdomen every 6 months Patient is not clear to me why she is not on any antiplatelet or statin.  Follow-up with PCP.  Hypothyroidism TSH 31.184 but free T4 low at 0.31 Continue Synthroid.   Endometrial cancer  s/p hysterectomy and chemo/rads in 2020 with vaginal recurrence treated with radiation in 2022-2023 and additional local/vaginal recurrence treated in fall of 2023 with additional radiation Followed at [facility], last seen in 11/2022 Negative cuff pap at that time Followed q3 months   Cystic lesion on body of pancreas Seen on CT in April Needs f/u MRCP in 1 year      Anxiety Continue hydroxyzine   Chronic pain Xtampza, pregabalin, and oxy  Obesity  Body mass index is 33.38 kg/m.  Patient has been advised to make an attempt to improve diet and exercise patterns to aid in weight loss.   Goals of care   Code Status: Full Code   Wounds:  -    Discharge Exam:   Vitals:   03/11/23 1915 03/11/23 1948 03/12/23 0433 03/12/23 0538  BP: 139/74 (!) 159/82 121/78   Pulse: 69 69 64   Resp: 16 17 17    Temp: 98 F (36.7 C) 97.7 F (36.5 C) 98.1 F (36.7 C)   TempSrc:  Oral Oral   SpO2: 96% 99% 94%   Weight:    91 kg  Height:        Body mass index is 33.38 kg/m.   General exam: Pleasant, elderly Caucasian female.  Not in physical distress Skin: No rashes, lesions or ulcers. HEENT: Atraumatic, normocephalic, no obvious bleeding Lungs: Clear to auscultation bilaterally CVS: Regular rate and rhythm, no murmur GI/Abd soft, nontender, nondistended, blood sound present CNS: Alert, awake, oriented x 3 Psychiatry: Mood appropriate Extremities: No pedal edema, no calf tenderness  Follow ups:    Follow-up Information     Berneice Heinrich Delbert Phenix., MD Follow up on 03/28/2023.   Specialty: Urology Why: at 3:30 for MD visit and pathology review. Contact information: [address] [place] [address] [phone]         Christia Reading, PA-C Follow up.   Specialty: Physician Assistant Contact information: [address] [place] [address] [phone]                 Discharge Instructions:   Discharge Instructions     Call MD for:  difficulty breathing, headache or visual disturbances   Complete by: As directed    Call MD for:  extreme fatigue   Complete by: As directed    Call MD for:  hives   Complete by: As directed    Call MD for:  persistant dizziness or light-headedness   Complete by: As directed    Call MD for:  persistant nausea and vomiting   Complete by: As directed    Call MD for:  severe uncontrolled pain   Complete by: As directed    Call MD for:  temperature >100.4   Complete by: As directed    Diet general    Complete by: As directed    Discharge instructions   Complete by: As directed    Recommendations at discharge:   Follow-up with PCP for diabetes control  Follow-up with urology as an outpatient.  Discharge instructions for diabetes mellitus: Check blood sugar 3 times a day and bedtime at home. If blood sugar running above 200 or less than 70 please call your MD to adjust insulin. If you notice signs and symptoms of hypoglycemia (low blood sugar) like jitteriness, confusion, thirst, tremor and sweating, please check blood sugar, drink sugary drink/biscuits/sweets to increase sugar  level and call MD or return to ER.    General discharge instructions: Follow with Primary MD Christia Reading, PA-C in 7 days  Please request your PCP  to go over your hospital tests, procedures, radiology results at the follow up. Please get your medicines reviewed and adjusted.  Your PCP may decide to repeat certain labs or tests as needed. Do not drive, operate heavy machinery, perform activities at heights, swimming or participation in water activities or provide baby sitting services if your were admitted for syncope or siezures until you have seen by Primary MD or a Neurologist and advised to do so again. [place] Controlled Substance Reporting System database was reviewed. Do not drive, operate heavy machinery, perform activities at heights, swim, participate in water activities or provide baby-sitting services while on medications for pain, sleep and mood until your outpatient physician has reevaluated you and advised to do so again.  You are strongly recommended to comply with the dose, frequency and duration of prescribed medications. Activity: As tolerated with Full fall precautions use walker/cane & assistance as needed Avoid using any recreational substances like cigarette, tobacco, alcohol, or non-prescribed drug. If you experience worsening of your admission symptoms, develop shortness of  breath, life threatening emergency, suicidal or homicidal thoughts you must seek medical attention immediately by calling 911 or calling your MD immediately  if symptoms less severe. You must read complete instructions/literature along with all the possible adverse reactions/side effects for all the medicines you take and that have been prescribed to you. Take any new medicine only after you have completely understood and accepted all the possible adverse reactions/side effects.  Wear Seat belts while driving. You were cared for by a hospitalist during your hospital stay. If you have any questions about your discharge medications or the care you received while you were in the hospital after you are discharged, you can call the unit and ask to speak with the hospitalist or the covering physician. Once you are discharged, your primary care physician will handle any further medical issues. Please note that NO REFILLS for any discharge medications will be authorized once you are discharged, as it is imperative that you return to your primary care physician (or establish a relationship with a primary care physician if you do not have one).   Increase activity slowly   Complete by: As directed        Discharge Medications:   Allergies as of 03/12/2023   No Known Allergies      Medication List     TAKE these medications    cyanocobalamin 1000 MCG tablet Commonly known as: VITAMIN B12 Take 1,000 mcg by mouth daily.   ferrous sulfate 325 (65 FE) MG tablet Take 1 tablet (325 mg total) by mouth daily with breakfast.   hydrOXYzine 25 MG tablet Commonly known as: ATARAX Take 25 mg by mouth every 6 (six) hours as needed for anxiety.   levothyroxine 125 MCG tablet Commonly known as: SYNTHROID Take 125 mcg by mouth daily before breakfast.   omeprazole 40 MG capsule Commonly known as: PRILOSEC Take 40 mg by mouth daily as needed (indigestion).   oxybutynin 5 MG tablet Commonly known as:  DITROPAN Take 1 tablet (5 mg total) by mouth every 8 (eight) hours as needed for bladder spasms.   oxyCODONE 15 MG immediate release tablet Commonly known as: ROXICODONE Take 15 mg by mouth every 6 (six) hours as needed for pain.   polyethylene glycol 17 g packet Commonly  known as: MIRALAX / GLYCOLAX Take 17 g by mouth daily. What changed:  when to take this reasons to take this   pregabalin 200 MG capsule Commonly known as: LYRICA Take 200 mg by mouth 3 (three) times daily.   senna 8.6 MG tablet Commonly known as: SENOKOT Take 2 tablets by mouth at bedtime.   Xtampza ER 13.5 MG C12a Generic drug: oxyCODONE ER Take 1 capsule by mouth in the morning and at bedtime.         The results of significant diagnostics from this hospitalization (including imaging, microbiology, ancillary and laboratory) are listed below for reference.    Procedures and Diagnostic Studies:   CT ABDOMEN PELVIS W WO CONTRAST  Result Date: 03/07/2023 CLINICAL DATA:  Abdominal/flank pain, pelvic pain, history of bladder cancer and nephrolithiasis EXAM: CT ABDOMEN AND PELVIS WITHOUT AND WITH CONTRAST TECHNIQUE: Multidetector CT imaging of the abdomen and pelvis was performed following the standard protocol before and following the bolus administration of intravenous contrast. RADIATION DOSE REDUCTION: This exam was performed according to the departmental dose-optimization program which includes automated exposure control, adjustment of the mA and/or kV according to patient size and/or use of iterative reconstruction technique. CONTRAST:  80mL OMNIPAQUE IOHEXOL 300 MG/ML  SOLN COMPARISON:  01/12/2023 FINDINGS: Lower chest: Median sternotomy has been performed. Extensive coronary artery calcification. Central venous catheter tip visualized at the superior margin of the examination within the terminal superior vena cava. Lingular atelectasis. Hepatobiliary: Mild hepatomegaly and mild hepatic steatosis. No  enhancing intrahepatic mass. No intra or extrahepatic biliary ductal dilation. Gallbladder unremarkable. Pancreas: Unremarkable Spleen: Unremarkable Adrenals/Urinary Tract: The adrenal glands are unremarkable. The kidneys are normal in position. Mild to moderate right renal cortical atrophy and scarring noted. There is moderate right hydronephrosis and hydroureter. A double-J ureteral stent is in expected position. A 9 mm calculus is seen within the proximal right ureter adjacent to the ureteral stent. On the left, there is moderate hydronephrosis as well as prominent urothelial enhancement and moderate left perinephric stranding. This may relate to a recently passed ureteral calculus, surgical intervention, or an ascending urinary tract infection. A 3 mm nonobstructing calculus is seen within the lower pole of the left kidney. Delayed images demonstrate asymmetric bladder wall thickening involving the base of the bladder, asymmetrically involving the left ureterovesicular junction suspicious for an infiltrative mass in this patient with a history of bladder cancer. Edema related to surgical intervention could appear similarly. There is a small amount of poorly circumscribed extraluminal fluid and infiltration within this region best seen on axial image # 88/6 and sagittal image # 79/11 which again may relate to recent intervention with transmural penetration or transmural infiltration of the aforementioned potential mass. Stomach/Bowel: Stomach is within normal limits. Appendix appears normal. No evidence of bowel wall thickening, distention, or inflammatory changes. Vascular/Lymphatic: Stable 3.2 cm fusiform infrarenal abdominal aortic aneurysm superimposed extensive aortoiliac atherosclerotic calcification. No pathologic adenopathy within the abdomen and pelvis. Reproductive: Infiltrative changes mentioned above along the left posterolateral base of the bladder in the region of the left ureterovesicular junction  appear to infiltrate into the prostate gland itself which appears slightly enlarged, possibly related to edema or infiltration. Fiducial markers are again seen within the prostate gland. Other: None Musculoskeletal: L4-5 posterior lumbar fusion with instrumentation has been performed. No acute bone abnormality. No lytic or blastic bone lesion. IMPRESSION: 1. Asymmetric bladder wall thickening involving the base of the bladder, asymmetrically involving the left ureterovesicular junction suspicious for an infiltrative mass  in this patient with a history of bladder cancer. Edema related to surgical intervention could appear similarly. There is a small amount of poorly circumscribed extraluminal fluid and infiltration within this region which again may relate to recent intervention with transmural penetration or transmural infiltration of the aforementioned potential mass. Correlation with surgical history and possible cystoscopy may be helpful for further management. 2. Moderate left hydronephrosis as well as prominent urothelial enhancement and moderate left perinephric stranding. This may relate to a recently passed ureteral calculus, surgical intervention, or an ascending urinary tract infection. Correlation with urinalysis and urine culture may be helpful. 3. Moderate right hydronephrosis and hydroureter despite double-J ureteral stent in expected position. Correlation for possible ureteral stent occlusion is recommended. 9 mm calculus within the proximal right ureter adjacent to the ureteral stent. 4. Mild to moderate right renal cortical atrophy and scarring. 5. Minimal nonobstructing left nephrolithiasis. 6. Stable 3.2 cm fusiform infrarenal abdominal aortic aneurysm. Recommend follow-up ultrasound every 3 years. 7. Aortic atherosclerosis. Aortic Atherosclerosis (ICD10-I70.0). Electronically Signed   By: Helyn Numbers M.D.   On: 03/07/2023 19:37     Labs:   Basic Metabolic Panel: Recent Labs  Lab  03/07/23 1658 03/08/23 0546 03/09/23 0416 03/10/23 0435  NA 137 139 140 140  K 4.3 3.5 3.9 4.1  CL 102 107 107 109  CO2 24 24 25 24   GLUCOSE 357* 177* 143* 172*  BUN 34* 24* 18 16  CREATININE 1.26* 1.04* 0.92 0.93  CALCIUM 8.4* 7.6* 7.5* 7.8*   GFR Estimated Creatinine Clearance: 66.3 mL/min (by C-G formula based on SCr of 0.93 mg/dL). Liver Function Tests: Recent Labs  Lab 03/07/23 1658 03/08/23 0546 03/09/23 0416  AST 143* 99* 65*  ALT 126* 96* 77*  ALKPHOS 138* 122 118  BILITOT 0.5 0.3 0.2*  PROT 7.2 6.2* 6.1*  ALBUMIN 3.5 3.1* 3.0*   Recent Labs  Lab 03/07/23 1658  LIPASE 24   No results for input(s): "AMMONIA" in the last 168 hours. Coagulation profile Recent Labs  Lab 03/08/23 0546  INR 1.0    CBC: Recent Labs  Lab 03/07/23 1658 03/08/23 0137 03/08/23 0546 03/09/23 0416 03/10/23 0435  WBC 8.1  --  5.9 5.6 5.9  NEUTROABS 7.1  --   --  3.8 4.2  HGB 10.8* 9.5* 9.6* 9.5* 9.5*  HCT 35.3* 31.0* 31.3* 31.5* 32.3*  MCV 95.7  --  96.9 98.1 100.0  PLT 142*  --  130* 134* 125*   Cardiac Enzymes: No results for input(s): "CKTOTAL", "CKMB", "CKMBINDEX", "TROPONINI" in the last 168 hours. BNP: Invalid input(s): "POCBNP" CBG: Recent Labs  Lab 03/11/23 1813 03/11/23 2043 03/12/23 0151 03/12/23 0431 03/12/23 0729  GLUCAP 125* 193* 227* 163* 135*   D-Dimer No results for input(s): "DDIMER" in the last 72 hours. Hgb A1c No results for input(s): "HGBA1C" in the last 72 hours. Lipid Profile No results for input(s): "CHOL", "HDL", "LDLCALC", "TRIG", "CHOLHDL", "LDLDIRECT" in the last 72 hours. Thyroid function studies No results for input(s): "TSH", "T4TOTAL", "T3FREE", "THYROIDAB" in the last 72 hours.  Invalid input(s): "FREET3" Anemia work up No results for input(s): "VITAMINB12", "FOLATE", "FERRITIN", "TIBC", "IRON", "RETICCTPCT" in the last 72 hours. Microbiology Recent Results (from the past 240 hour(s))  Urine Culture (for pregnant,  neutropenic or urologic patients or patients with an indwelling urinary catheter)     Status: Abnormal   Collection Time: 03/07/23  4:36 PM   Specimen: Urine, Clean Catch  Result Value Ref Range Status   Specimen  Description   Final    URINE, CLEAN CATCH Performed at [facility], [address] [place], [place] [address]    Special Requests   Final    NONE Performed at [facility], [address] [place], [place] [address]    Culture MULTIPLE SPECIES PRESENT, SUGGEST RECOLLECTION (A)  Final   Report Status 03/09/2023 FINAL  Final    Time coordinating discharge: 45 minutes  Signed: Belia Febo  [facility] 03/12/2023, 10:53 AM

## 2023-03-12 NOTE — Plan of Care (Signed)
  Problem: Education: Goal: Knowledge of General Education information will improve Description: Including pain rating scale, medication(s)/side effects and non-pharmacologic comfort measures Outcome: Not Progressing   Problem: Health Behavior/Discharge Planning: Goal: Ability to manage health-related needs will improve Outcome: Not Progressing   

## 2023-03-14 ENCOUNTER — Encounter (HOSPITAL_COMMUNITY): Payer: Self-pay | Admitting: Urology

## 2023-03-16 LAB — SURGICAL PATHOLOGY

## 2023-05-18 ENCOUNTER — Encounter: Payer: Self-pay | Admitting: Cardiovascular Disease

## 2023-05-18 ENCOUNTER — Ambulatory Visit: Payer: Medicare Other | Attending: Cardiovascular Disease | Admitting: Cardiovascular Disease
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
## 2023-08-04 ENCOUNTER — Other Ambulatory Visit: Payer: Self-pay

## 2023-08-04 ENCOUNTER — Ambulatory Visit: Payer: Medicare Other | Attending: Physician Assistant

## 2023-08-04 DIAGNOSIS — R252 Cramp and spasm: Secondary | ICD-10-CM | POA: Insufficient documentation

## 2023-08-04 DIAGNOSIS — R293 Abnormal posture: Secondary | ICD-10-CM | POA: Insufficient documentation

## 2023-08-04 DIAGNOSIS — M542 Cervicalgia: Secondary | ICD-10-CM | POA: Diagnosis present

## 2023-08-18 ENCOUNTER — Ambulatory Visit: Payer: Medicare Other

## 2023-08-25 ENCOUNTER — Other Ambulatory Visit: Payer: Self-pay

## 2023-08-25 ENCOUNTER — Encounter (HOSPITAL_COMMUNITY): Payer: Self-pay

## 2023-08-25 ENCOUNTER — Inpatient Hospital Stay (HOSPITAL_COMMUNITY)
Admission: EM | Admit: 2023-08-25 | Discharge: 2023-08-28 | DRG: 669 | Disposition: A | Discharge status: Home / Self Care | Payer: Medicare Other | Attending: Family Medicine | Admitting: Family Medicine

## 2023-08-25 ENCOUNTER — Emergency Department (HOSPITAL_COMMUNITY): Payer: Medicare Other

## 2023-08-25 DIAGNOSIS — I771 Stricture of artery: Secondary | ICD-10-CM | POA: Diagnosis present

## 2023-08-25 DIAGNOSIS — N179 Acute kidney failure, unspecified: Secondary | ICD-10-CM

## 2023-08-25 DIAGNOSIS — F419 Anxiety disorder, unspecified: Secondary | ICD-10-CM | POA: Diagnosis present

## 2023-08-25 DIAGNOSIS — I708 Atherosclerosis of other arteries: Secondary | ICD-10-CM | POA: Diagnosis present

## 2023-08-25 DIAGNOSIS — N320 Bladder-neck obstruction: Secondary | ICD-10-CM | POA: Diagnosis present

## 2023-08-25 DIAGNOSIS — Z9221 Personal history of antineoplastic chemotherapy: Secondary | ICD-10-CM

## 2023-08-25 DIAGNOSIS — E1165 Type 2 diabetes mellitus with hyperglycemia: Secondary | ICD-10-CM | POA: Diagnosis present

## 2023-08-25 DIAGNOSIS — Z923 Personal history of irradiation: Secondary | ICD-10-CM

## 2023-08-25 DIAGNOSIS — C689 Malignant neoplasm of urinary organ, unspecified: Secondary | ICD-10-CM

## 2023-08-25 DIAGNOSIS — E1142 Type 2 diabetes mellitus with diabetic polyneuropathy: Secondary | ICD-10-CM | POA: Diagnosis present

## 2023-08-25 DIAGNOSIS — C679 Malignant neoplasm of bladder, unspecified: Principal | ICD-10-CM | POA: Diagnosis present

## 2023-08-25 DIAGNOSIS — Z90710 Acquired absence of both cervix and uterus: Secondary | ICD-10-CM

## 2023-08-25 DIAGNOSIS — R310 Gross hematuria: Secondary | ICD-10-CM | POA: Diagnosis present

## 2023-08-25 DIAGNOSIS — Z87442 Personal history of urinary calculi: Secondary | ICD-10-CM

## 2023-08-25 DIAGNOSIS — I10 Essential (primary) hypertension: Secondary | ICD-10-CM | POA: Diagnosis present

## 2023-08-25 DIAGNOSIS — D62 Acute posthemorrhagic anemia: Secondary | ICD-10-CM | POA: Diagnosis not present

## 2023-08-25 DIAGNOSIS — F32A Depression, unspecified: Secondary | ICD-10-CM | POA: Diagnosis present

## 2023-08-25 DIAGNOSIS — D649 Anemia, unspecified: Secondary | ICD-10-CM | POA: Diagnosis not present

## 2023-08-25 DIAGNOSIS — Z8551 Personal history of malignant neoplasm of bladder: Secondary | ICD-10-CM

## 2023-08-25 DIAGNOSIS — R9431 Abnormal electrocardiogram [ECG] [EKG]: Secondary | ICD-10-CM | POA: Diagnosis present

## 2023-08-25 DIAGNOSIS — C541 Malignant neoplasm of endometrium: Secondary | ICD-10-CM | POA: Diagnosis present

## 2023-08-25 DIAGNOSIS — E039 Hypothyroidism, unspecified: Secondary | ICD-10-CM | POA: Diagnosis present

## 2023-08-25 DIAGNOSIS — Z87891 Personal history of nicotine dependence: Secondary | ICD-10-CM

## 2023-08-25 DIAGNOSIS — Z6831 Body mass index (BMI) 31.0-31.9, adult: Secondary | ICD-10-CM

## 2023-08-25 DIAGNOSIS — R339 Retention of urine, unspecified: Secondary | ICD-10-CM | POA: Diagnosis not present

## 2023-08-25 DIAGNOSIS — Z951 Presence of aortocoronary bypass graft: Secondary | ICD-10-CM

## 2023-08-25 DIAGNOSIS — Z801 Family history of malignant neoplasm of trachea, bronchus and lung: Secondary | ICD-10-CM

## 2023-08-25 DIAGNOSIS — I2510 Atherosclerotic heart disease of native coronary artery without angina pectoris: Secondary | ICD-10-CM | POA: Diagnosis present

## 2023-08-25 DIAGNOSIS — R338 Other retention of urine: Secondary | ICD-10-CM | POA: Diagnosis not present

## 2023-08-25 DIAGNOSIS — G894 Chronic pain syndrome: Secondary | ICD-10-CM | POA: Insufficient documentation

## 2023-08-25 DIAGNOSIS — E785 Hyperlipidemia, unspecified: Secondary | ICD-10-CM | POA: Diagnosis present

## 2023-08-25 DIAGNOSIS — E669 Obesity, unspecified: Secondary | ICD-10-CM | POA: Diagnosis present

## 2023-08-25 DIAGNOSIS — M797 Fibromyalgia: Secondary | ICD-10-CM | POA: Diagnosis present

## 2023-08-25 DIAGNOSIS — R0902 Hypoxemia: Secondary | ICD-10-CM | POA: Diagnosis present

## 2023-08-25 DIAGNOSIS — Z79891 Long term (current) use of opiate analgesic: Secondary | ICD-10-CM

## 2023-08-25 DIAGNOSIS — Z79890 Hormone replacement therapy (postmenopausal): Secondary | ICD-10-CM

## 2023-08-25 DIAGNOSIS — N1330 Unspecified hydronephrosis: Secondary | ICD-10-CM | POA: Diagnosis present

## 2023-08-25 DIAGNOSIS — Z8542 Personal history of malignant neoplasm of other parts of uterus: Secondary | ICD-10-CM

## 2023-08-25 DIAGNOSIS — Z8249 Family history of ischemic heart disease and other diseases of the circulatory system: Secondary | ICD-10-CM

## 2023-08-25 LAB — SODIUM, URINE, RANDOM: Sodium, Ur: 24 mmol/L

## 2023-08-25 LAB — URINALYSIS, W/ REFLEX TO CULTURE (INFECTION SUSPECTED)
Bilirubin Urine: NEGATIVE
Glucose, UA: >=500 mg/dL — AB
Ketones, ur: NEGATIVE mg/dL
Leukocytes,Ua: NEGATIVE
Nitrite: NEGATIVE
Protein, ur: 100 mg/dL — AB
RBC / HPF: >50 RBC/hpf (ref 0–5)
Specific Gravity, Urine: 1.021 (ref 1.005–1.030)
pH: 6 (ref 5.0–8.0)

## 2023-08-25 LAB — CBC
HCT: 17.4 % — ABNORMAL LOW (ref 36.0–46.0)
Hemoglobin: 5.4 g/dL — CL (ref 12.0–15.0)
MCH: 31 pg (ref 26.0–34.0)
MCHC: 31 g/dL (ref 30.0–36.0)
MCV: 100 fL (ref 80.0–100.0)
Platelets: 181 10*3/uL (ref 150–400)
RBC: 1.74 MIL/uL — ABNORMAL LOW (ref 3.87–5.11)
RDW: 17.4 % — ABNORMAL HIGH (ref 11.5–15.5)
WBC: 7.7 10*3/uL (ref 4.0–10.5)
nRBC: 0.4 % — ABNORMAL HIGH (ref 0.0–0.2)

## 2023-08-25 LAB — PREPARE RBC (CROSSMATCH)

## 2023-08-25 LAB — BASIC METABOLIC PANEL
Anion gap: 13 (ref 5–15)
BUN: 40 mg/dL — ABNORMAL HIGH (ref 8–23)
CO2: 26 mmol/L (ref 22–32)
Calcium: 9 mg/dL (ref 8.9–10.3)
Chloride: 93 mmol/L — ABNORMAL LOW (ref 98–111)
Creatinine, Ser: 1.53 mg/dL — ABNORMAL HIGH (ref 0.44–1.00)
GFR, Estimated: 37 mL/min — ABNORMAL LOW (ref >60)
Glucose, Bld: 589 mg/dL (ref 70–99)
Potassium: 5 mmol/L (ref 3.5–5.1)
Sodium: 132 mmol/L — ABNORMAL LOW (ref 135–145)

## 2023-08-25 LAB — TROPONIN I (HIGH SENSITIVITY)
Troponin I (High Sensitivity): 4 ng/L (ref <18)
Troponin I (High Sensitivity): 4 ng/L (ref <18)

## 2023-08-25 LAB — CBG MONITORING, ED
Glucose-Capillary: 282 mg/dL — ABNORMAL HIGH (ref 70–99)
Glucose-Capillary: 393 mg/dL — ABNORMAL HIGH (ref 70–99)

## 2023-08-25 LAB — CREATININE, URINE, RANDOM: Creatinine, Urine: 29 mg/dL

## 2023-08-25 MED ORDER — CHLORHEXIDINE GLUCONATE CLOTH 2 % EX PADS
6.0000 | MEDICATED_PAD | Freq: Every day | CUTANEOUS | Status: DC
  Administered 2023-08-26 – 2023-08-28 (×4): 6 via TOPICAL

## 2023-08-25 MED ORDER — SODIUM CHLORIDE 0.9 % IR SOLN
3000.0000 mL | Status: DC
  Administered 2023-08-25 – 2023-08-26 (×4): 3000 mL

## 2023-08-25 MED ORDER — SODIUM CHLORIDE 0.9 % IV SOLN
1.0000 g | INTRAVENOUS | Status: DC
  Administered 2023-08-25 – 2023-08-26 (×2): 1 g via INTRAVENOUS
  Filled 2023-08-25 (×3): qty 10

## 2023-08-25 MED ORDER — SODIUM CHLORIDE 0.9 % IV BOLUS
1000.0000 mL | Freq: Once | INTRAVENOUS | Status: AC
  Administered 2023-08-25: 1000 mL via INTRAVENOUS

## 2023-08-25 MED ORDER — IOHEXOL 350 MG/ML SOLN
75.0000 mL | Freq: Once | INTRAVENOUS | Status: AC | PRN
  Administered 2023-08-25: 75 mL via INTRAVENOUS

## 2023-08-25 MED ORDER — INSULIN ASPART 100 UNIT/ML IJ SOLN
8.0000 [IU] | Freq: Once | INTRAMUSCULAR | Status: AC
  Administered 2023-08-25: 8 [IU] via INTRAVENOUS
  Filled 2023-08-25: qty 0.08

## 2023-08-25 MED ORDER — SODIUM CHLORIDE 0.9% IV SOLUTION: Freq: Once | INTRAVENOUS | Status: DC

## 2023-08-25 MED ORDER — INSULIN ASPART 100 UNIT/ML IJ SOLN
0.0000 [IU] | INTRAMUSCULAR | Status: DC
  Administered 2023-08-25: 9 [IU] via SUBCUTANEOUS
  Administered 2023-08-26: 5 [IU] via SUBCUTANEOUS
  Administered 2023-08-26 (×2): 2 [IU] via SUBCUTANEOUS
  Filled 2023-08-25: qty 0.09

## 2023-08-25 NOTE — Assessment & Plan Note (Signed)
Order sliding scale Patient has several near hyperglycemia initially no evidence of DKA

## 2023-08-25 NOTE — Assessment & Plan Note (Signed)
>>  ASSESSMENT AND PLAN FOR ACUTE URINARY RETENTION WRITTEN ON 08/25/2023  8:33 PM BY Therisa Doyne, MD  Now status post three-way Foley appreciate urology input

## 2023-08-25 NOTE — Assessment & Plan Note (Signed)
--  Follow up as an outpatient

## 2023-08-25 NOTE — ED Notes (Signed)
Noted pt O2 sat 86% RA. Pt placed on 4L. O2 sat up to 97%. Pt RA at baseline. ED DO at bedside.

## 2023-08-25 NOTE — Assessment & Plan Note (Signed)
In the setting of hematuria. Transfuse 2 units follow serial CBC

## 2023-08-25 NOTE — Assessment & Plan Note (Signed)
Appreciate urology involvement and consult in a.m.

## 2023-08-25 NOTE — Subjective & Objective (Signed)
Patient presents with large amount of blood in urine for the past 2 months seen by urology and they recently removed his stent hoping that she will stop bleeding which she continues to pass large clots and lately has not been able to void Patient was found to be hypoxic down to 86% on room air and started 4 L  Patient have had urinary retention in the past but that usually clears up she has not urinated since last night though.  And has been having significant suprapubic discomfort similar to prior episodes of retention Did not feel short of breath despite being hypoxic no chest pain or shortness of breath

## 2023-08-25 NOTE — Assessment & Plan Note (Signed)
Now status post three-way Foley appreciate urology input

## 2023-08-25 NOTE — Assessment & Plan Note (Signed)
Chronic. 

## 2023-08-25 NOTE — Assessment & Plan Note (Signed)
-  will monitor on tele avoid QT prolonging medications, rehydrate correct electrolytes ? ?

## 2023-08-25 NOTE — ED Notes (Signed)
Dr. Adela Glimpse notified of concerns regarding pt needing possible upgrade in status to step-down level of care d/t significant continued hematuria with CBI running. Pt reports moderate to severe pain in bladder if CBI is stopped and urine becomes dark cherry to frank blood appearance. Pt BP readings also soft unless rate of blood transfusion increased to at least 153ml/hr. Pt became very drowsy with very low BP readings but improved once BP levels improved. Informed Dr. Adela Glimpse to come reassess the patient and agrees that increase in level of care to step-down is appropriate at this time for pt.

## 2023-08-25 NOTE — H&P (Signed)
[name] DGU:[id number] DOB: [birth year] DOA: 08/25/2023     PCP: Burna Sis   Outpatient Specialists:    [facility] Dr. Berneice Heinrich  Patient arrived to ER on 08/25/23 at 1517 Referred by Attending Coral Spikes, DO   Patient coming from:    home Lives  With family    Chief Complaint:   Chief Complaint  Patient presents with   Urinary Retention    HPI: Diana Kennedy is a 67 y.o. female with medical history significant of bladder tumor for which she underwent TURBT and ureteral stent placement on 4/1.  DM2, HTN, HLD, CAD, endometrial cancer s/p chemo/radiation, chronic anemia, hypothyroidism, peripheral neuropathy, anxiety/depression   right subclavian artery stenosis,   Presented with unable to urinate and blood clots in urine Patient presents with large amount of blood in urine for the past 2 months seen by urology and they recently removed his stent hoping that she will stop bleeding which she continues to pass large clots and lately has not been able to void Patient was found to be hypoxic down to 86% on room air and started 4 L  Patient have had urinary retention in the past but that usually clears up she has not urinated since last night though.  And has been having significant suprapubic discomfort similar to prior episodes of retention Did not feel short of breath despite being hypoxic no chest pain or shortness of breath   Patient states a few weeks ago she did have a bit of a chest pain but currently resolved she does not check her blood sugars at home and does not have any diabetes medications her primary care provider passed away and she has not been able to follow-up routinely since    Denies significant ETOH intake      No results found for: "SARSCOV2NAA"    Regarding pertinent Chronic problems:       CAD  - not on  Aspirin, statin, betablocker,         DM 2 -  Lab Results  Component Value Date   HGBA1C 9.0 (H) 02/28/2023   diet  controlled   Hypothyroidism:   Lab Results  Component Value Date   TSH 31.184 (H) 03/07/2023   T3TOTAL 24 (L) 03/08/2023   on synthroid   obesity-   BMI Readings from Last 1 Encounters:  08/25/23 31.32 kg/m       Chronic anemia - baseline hg Hemoglobin & Hematocrit  Recent Labs    03/09/23 0416 03/10/23 0435 08/25/23 1620  HGB 9.5* 9.5* 5.4*   Iron/TIBC/Ferritin/ %Sat    Component Value Date/Time   IRON 22 (L) 01/08/2023 0912   TIBC 451 (H) 01/08/2023 0912   FERRITIN 29 01/08/2023 0912   IRONPCTSAT 5 (L) 01/08/2023 0912     Cancer: Bladder cancer 6/14, underwent cystoscopy, bilateral retrograde pyelogram, right ureteroscopy with laser lithotripsy and stent exchange and restaging TURBT    While in ER: 3 way was placed and she was able to start voiding  Still pink urine in foley  CTA negative for PE  Urology aware will see in AM   Noted to be transiently hypotensive 70s Started with IV blood transfusion and improved currently blood pressure into 1 teens but admitted to stepdown given significant anemia history of CAD and hypertension transient Clinical Course as of 08/25/23 2033  Thu Aug 25, 2023  1554 PMH per chart review significant for DM2, HTN, HLD, CAD, right subclavian  artery stenosis, endometrial cancer s/p chemo/radiation, chronic anemia, hypothyroidism, peripheral neuropathy, anxiety/depression as well as bladder tumor for which she underwent TURBT and ureteral stent placement and removal" [TY]  1607 DG Chest Portable 1 View IMPRESSION: Bibasilar opacities, left greater than right, which may be due to atelectasis, pneumonia or a combination. No pulmonary edema.      [TY]  1727 Basic metabolic panel(!!) Appears to have pseudohyponatremia secondary to hyperglycemia.  Does not appear to be in DKA.  Bicarb normal.  No anion gap.  AKI noted suspect obstructive.  Will give fluids in anticipation for IV contrast.  Will also give insulin for her blood sugar. [TY]   1728 Urinalysis, w/ Reflex to Culture (Infection Suspected) -Urine, Clean Catch(!) Not Consistent with urinary tract infection [TY]  1728 Troponin I (High Sensitivity): 4 Cardiac chest pain less likely.   [TY]  1745 CBC(!!) Appears to have baseline hemoglobin of 9.5.  Was 5.4 today.  In setting of her hematuria suspect blood loss.  Will transfuse. [TY]  1946 CT ABDOMEN PELVIS W CONTRAST IMPRESSION: 1. Please see separately dictated CT angiography chest 08/25/2023. 2. High density material within the urinary bladder lumen - finding may represent blood products versus less likely mass. Associated similar-appearing severe right hydronephrosis and proximal right hydroureter with associated filling defect of the right renal pelvis and proximal hydroureter on delayed imaging - question underlying blood products versus mass. No nephroureterolithiasis. Interval removal of a right ureteral stent. Recommend urologic consultation. 3. 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 Recommend follow-up ultrasound every 3 years. (Ref.: J Vasc Surg. 2018; 67:2-77 and J Am Coll Radiol 2013;10(10):789-794.). Aortic aneurysm NOS (ICD10-I71.9).   [TY]  2001 Spoke with urology, Dr. Pete Glatter, will see and evaluate patient.  Agrees with admission and TREATMENT. [TY]  2019 Spoke with hospitalist; agrees to see the patient. [TY]    Clinical Course User Index [TY] Coral Spikes, DO     Lab Orders         CBC         Basic metabolic panel         Urinalysis, w/ Reflex to Culture (Infection Suspected) -Urine, Clean Catch         POC CBG, ED      CXR - Bibasilar opacities, left greater than right, which may be due to atelectasis, pneumonia or a combination. No pulmonary edema.  CTabd/pelvis - severe right hydronephrosis and proximal right hydroureter with associated filling defect of the right renal pelvis and proximal hydroureter on delayed imaging   CTA chest - no PE, Low lung volumes  with subsegmental atelectasis at the lung bases.   Following Medications were ordered in ER: Medications  sodium chloride irrigation 0.9 % 3,000 mL (3,000 mLs Irrigation New Bag/Given 08/25/23 1629)  0.9 %  sodium chloride infusion (Manually program via Guardrails IV Fluids) (has no administration in time range)  sodium chloride 0.9 % bolus 1,000 mL (1,000 mLs Intravenous New Bag/Given 08/25/23 1826)  insulin aspart (novoLOG) injection 8 Units (8 Units Intravenous Given 08/25/23 1842)  iohexol (OMNIPAQUE) 350 MG/ML injection 75 mL (75 mLs Intravenous Contrast Given 08/25/23 1800)    _______________________________________________________ ER Provider Called:    Urology Dr. Pete Glatter,  They Recommend admit to medicine   Will see in AM      ED Triage Vitals  Encounter Vitals Group     BP 08/25/23 1524 (!) 142/74     Systolic BP Percentile --      Diastolic BP  Percentile --      Pulse Rate 08/25/23 1524 (!) 118     Resp 08/25/23 1524 20     Temp 08/25/23 1524 98.4 F (36.9 C)     Temp Source 08/25/23 1524 Oral     SpO2 08/25/23 1524 95 %     Weight 08/25/23 1523 200 lb (90.7 kg)     Height 08/25/23 1523 5\' 7"  (1.702 m)     Head Circumference --      Peak Flow --      Pain Score 08/25/23 1526 10     Pain Loc --      Pain Education --      Exclude from Growth Chart --   ZOXW(96)@     _________________________________________ Significant initial  Findings: Abnormal Labs Reviewed  CBC - Abnormal; Notable for the following components:      Result Value   RBC 1.74 (*)    Hemoglobin 5.4 (*)    HCT 17.4 (*)    RDW 17.4 (*)    nRBC 0.4 (*)    All other components within normal limits  BASIC METABOLIC PANEL - Abnormal; Notable for the following components:   Sodium 132 (*)    Chloride 93 (*)    Glucose, Bld 589 (*)    BUN 40 (*)    Creatinine, Ser 1.53 (*)    GFR, Estimated 37 (*)    All other components within normal limits  URINALYSIS, W/ REFLEX TO CULTURE (INFECTION  SUSPECTED) - Abnormal; Notable for the following components:   APPearance HAZY (*)    Glucose, UA >=500 (*)    Hgb urine dipstick MODERATE (*)    Protein, ur 100 (*)    Bacteria, UA RARE (*)    All other components within normal limits       Cardiac Panel (last 3 results) Recent Labs    08/25/23 1620 08/25/23 1841  TROPONINIHS 4 4     ECG: Ordered Personally reviewed and interpreted by me showing: HR : 112 Rhythm: Sinus tachycardia Low voltage, precordial leads Borderline repolarization abnormality Prolonged QT interval QTC 535    The recent clinical data is shown below. Vitals:   08/25/23 1524 08/25/23 1645 08/25/23 1914 08/25/23 1925  BP: (!) 142/74 136/71 (!) 155/117 107/61  Pulse: (!) 118 (!) 102 (!) 105 (!) 103  Resp: 20 16 14 16   Temp: 98.4 F (36.9 C)  98 F (36.7 C) 98.1 F (36.7 C)  TempSrc: Oral  Oral Oral  SpO2: 95% 99% 100% 99%  Weight:      Height:        WBC     Component Value Date/Time   WBC 7.7 08/25/2023 1620   LYMPHSABS 1.1 03/10/2023 0435   MONOABS 0.3 03/10/2023 0435   EOSABS 0.1 03/10/2023 0435   BASOSABS 0.0 03/10/2023 0435      Procalcitonin   Ordered      UA   no evidence of UTI hematuria   Urine analysis:    Component Value Date/Time   COLORURINE PENDING 08/25/2023 1647   APPEARANCEUR HAZY (A) 08/25/2023 1647   LABSPEC 1.021 08/25/2023 1647   PHURINE 6.0 08/25/2023 1647   GLUCOSEU >=500 (A) 08/25/2023 1647   HGBUR MODERATE (A) 08/25/2023 1647   BILIRUBINUR NEGATIVE 08/25/2023 1647   KETONESUR NEGATIVE 08/25/2023 1647   PROTEINUR 100 (A) 08/25/2023 1647   UROBILINOGEN 1.0 05/06/2014 1401   NITRITE NEGATIVE 08/25/2023 1647   LEUKOCYTESUR NEGATIVE 08/25/2023 1647    Results  for orders placed or performed during the hospital encounter of 03/07/23  Urine Culture (for pregnant, neutropenic or urologic patients or patients with an indwelling urinary catheter)     Status: Abnormal   Collection Time: 03/07/23  4:36 PM    Specimen: Urine, Clean Catch  Result Value Ref Range Status   Specimen Description   Final    URINE, CLEAN CATCH Performed at [facility], [address] [place], [place] [address]    Special Requests   Final    NONE Performed at [facility], [address], [place], [place] [address]    Culture MULTIPLE SPECIES PRESENT, SUGGEST RECOLLECTION (A)  Final   Report Status 03/09/2023 FINAL  Final    _______  __________________________________________________________ Recent Labs  Lab 08/25/23 1620  NA 132*  K 5.0  CO2 26  GLUCOSE 589*  BUN 40*  CREATININE 1.53*  CALCIUM 9.0    Cr   Up from baseline see below Lab Results  Component Value Date   CREATININE 1.53 (H) 08/25/2023   CREATININE 0.93 03/10/2023   CREATININE 0.92 03/09/2023    No results for input(s): "AST", "ALT", "ALKPHOS", "BILITOT", "PROT", "ALBUMIN" in the last 168 hours. Lab Results  Component Value Date   CALCIUM 9.0 08/25/2023   PHOS 4.9 (H) 01/08/2023    Plt: Lab Results  Component Value Date   PLT 181 08/25/2023       Recent Labs  Lab 08/25/23 1620  WBC 7.7  HGB 5.4*  HCT 17.4*  MCV 100.0  PLT 181    HG/HCT   Down  from baseline see below    Component Value Date/Time   HGB 5.4 (LL) 08/25/2023 1620   HCT 17.4 (L) 08/25/2023 1620   MCV 100.0 08/25/2023 1620     _______________________________________________ Hospitalist was called for admission for   Acute urinary retention AKI ,Acute blood loss anemia due to hamaturia    The following Work up has been ordered so far:  Orders Placed This Encounter  Procedures   CT ABDOMEN PELVIS W CONTRAST   CT Angio Chest PE W and/or Wo Contrast   DG Chest Portable 1 View   CBC   Basic metabolic panel   Urinalysis, w/ Reflex to Culture (Infection Suspected) -Urine, Clean Catch   Bladder scan   Continuous Bladder Irrigation   Informed Consent Details: Physician/Practitioner Attestation; Transcribe to  consent form and obtain patient signature   Consult to urology   Consult to hospitalist   POC CBG, ED   EKG 12-Lead   Type and screen [facility]   Prepare RBC (crossmatch)     OTHER Significant initial  Findings:  labs showing:     DM  labs:  HbA1C: Recent Labs    01/08/23 0912 02/28/23 1342  HGBA1C 7.5* 9.0*       CBG (last 3)  No results for input(s): "GLUCAP" in the last 72 hours.        Cultures:    Component Value Date/Time   SDES  03/07/2023 1636    URINE, CLEAN CATCH Performed at [facility], [address] [place], [place] [address]    SPECREQUEST  03/07/2023 1636    NONE Performed at [facility], [address], [place], [place] [address]    CULT MULTIPLE SPECIES PRESENT, SUGGEST RECOLLECTION (A) 03/07/2023 1636   REPTSTATUS 03/09/2023 FINAL 03/07/2023 1636     Radiological Exams on Admission: CT Angio Chest PE W  and/or Wo Contrast  Result Date: 08/25/2023 CLINICAL DATA:  Hypoxia EXAM: CT ANGIOGRAPHY CHEST WITH CONTRAST TECHNIQUE: Multidetector CT imaging of the chest was performed using the standard protocol during bolus administration of intravenous contrast. Multiplanar CT image reconstructions and MIPs were obtained to evaluate the vascular anatomy. RADIATION DOSE REDUCTION: This exam was performed according to the departmental dose-optimization program which includes automated exposure control, adjustment of the mA and/or kV according to patient size and/or use of iterative reconstruction technique. CONTRAST:  75mL OMNIPAQUE IOHEXOL 350 MG/ML SOLN COMPARISON:  Chest x-ray 08/25/2023, CT chest 05/07/2023 FINDINGS: Cardiovascular: Satisfactory opacification of the pulmonary arteries to the segmental level. No evidence of pulmonary embolism. Moderate aortic atherosclerosis. No aneurysm or dissection. Post CABG changes. Coronary vascular calcification. Borderline to mild cardiomegaly. No pericardial  effusion Mediastinum/Nodes: Midline trachea. No thyroid mass. No suspicious lymph nodes. Esophagus within normal limits. Lungs/Pleura: No pleural effusion or pneumothorax. Atelectasis or scarring in the lingula. Subsegmental atelectasis at both lung bases. Upper Abdomen: No acute findings. Musculoskeletal: Post sternotomy changes. No acute osseous abnormality. Review of the MIP images confirms the above findings. IMPRESSION: 1. Negative for acute pulmonary embolus or aortic dissection. 2. Borderline to mild cardiomegaly. Post CABG changes. 3. Low lung volumes with subsegmental atelectasis at the lung bases. 4. Aortic atherosclerosis. Aortic Atherosclerosis (ICD10-I70.0). Electronically Signed   By: Jasmine Pang M.D.   On: 08/25/2023 18:50   CT ABDOMEN PELVIS W CONTRAST  Result Date: 08/25/2023 CLINICAL DATA:  UTI, recurrent/complicated (Female). History of bladder cancer and nephrolithiasis. EXAM: CT ABDOMEN AND PELVIS WITH CONTRAST TECHNIQUE: Multidetector CT imaging of the abdomen and pelvis was performed using the standard protocol following bolus administration of intravenous contrast. RADIATION DOSE REDUCTION: This exam was performed according to the departmental dose-optimization program which includes automated exposure control, adjustment of the mA and/or kV according to patient size and/or use of iterative reconstruction technique. CONTRAST:  75mL OMNIPAQUE IOHEXOL 350 MG/ML SOLN COMPARISON:  CT chest abdomen pelvis 05/07/2023, CT abdomen pelvis 03/07/2023 FINDINGS: Lower chest: Please see separately dictated CT angiography chest 08/25/2023. Hepatobiliary: The hepatic parenchyma is diffusely hypodense compared to the splenic parenchyma consistent with fatty infiltration. No focal liver abnormality. No gallstones, gallbladder wall thickening, or pericholecystic fluid. No biliary dilatation. Pancreas: No focal lesion. Normal pancreatic contour. No surrounding inflammatory changes. No main pancreatic  ductal dilatation. Spleen: Normal in size without focal abnormality. Adrenals/Urinary Tract: No adrenal nodule bilaterally. Interval removal right ureteral stent. Bilateral kidneys enhance symmetrically. Right renal scarring. Similar-appearing severe right hydroureteronephrosis. The mid to distal right uterus normal in caliber. Fullness of the left collecting system with no frank hydronephrosis. No left hydroureter. No nephroureterolithiasis bilaterally. High density material within the urinary bladder lumen. Foley catheter tip and balloon within the urinary bladder lumen. No urinary bladder wall thickening. Posterior right urinary bladder diverticula (4:312). On delayed imaging, there is no urothelial wall thickening and there are no filling defects in the opacified portions of the left collecting system or ureter. Filling defect of the right renal pelvis and proximal hydroureter on delayed imaging. Stomach/Bowel: Stomach is within normal limits. No evidence of bowel wall thickening or dilatation. Stool throughout the colon. Appendix appears normal. Vascular/Lymphatic: Infrarenal abdominal aorta measures up to 3 cm. No iliac aneurysm. Severe atherosclerotic plaque of the aorta and its branches. No abdominal, pelvic, or inguinal lymphadenopathy. Reproductive: Status post hysterectomy. No adnexal masses. Other: No intraperitoneal free fluid. No intraperitoneal free gas. No organized fluid collection. Musculoskeletal: No abdominal wall hernia or abnormality.  No suspicious lytic or blastic osseous lesions. No acute displaced fracture. L4-L5 posterolateral interbody surgical hardware fusion. IMPRESSION: 1. Please see separately dictated CT angiography chest 08/25/2023. 2. High density material within the urinary bladder lumen - finding may represent blood products versus less likely mass. Associated similar-appearing severe right hydronephrosis and proximal right hydroureter with associated filling defect of the right  renal pelvis and proximal hydroureter on delayed imaging - question underlying blood products versus mass. No nephroureterolithiasis. Interval removal of a right ureteral stent. Recommend urologic consultation. 3. 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 Recommend follow-up ultrasound every 3 years. (Ref.: J Vasc Surg. 2018; 67:2-77 and J Am Coll Radiol 2013;10(10):789-794.). Aortic aneurysm NOS (ICD10-I71.9). 4.  Aortic Atherosclerosis (ICD10-I70.0)-severe. Electronically Signed   By: Tish Frederickson M.D.   On: 08/25/2023 18:48   DG Chest Portable 1 View  Result Date: 08/25/2023 CLINICAL DATA:  Hypoxia. EXAM: PORTABLE CHEST 1 VIEW COMPARISON:  05/07/2023. FINDINGS: Cardiac silhouette is normal in size. No mediastinal or hilar masses. There is hazy opacity at the left lung base. Mild opacities noted in medial right lung base. Remainder of the lungs is clear. No pleural effusion or pneumothorax. Right anterior chest wall Port-A-Cath is stable. Skeletal structures are grossly intact. IMPRESSION: Bibasilar opacities, left greater than right, which may be due to atelectasis, pneumonia or a combination. No pulmonary edema. Electronically Signed   By: Amie Portland M.D.   On: 08/25/2023 16:03   _______________________________________________________________________________________________________ Latest  Blood pressure 107/61, pulse (!) 103, temperature 98.1 F (36.7 C), temperature source Oral, resp. rate 16, height 5\' 7"  (1.702 m), weight 90.7 kg, SpO2 99%.   Vitals  labs and radiology finding personally reviewed  Review of Systems:    Pertinent positives include: Feeling a bit lightheaded  Constitutional:  No weight loss, night sweats, Fevers, chills, fatigue, weight loss  HEENT:  No headaches, Difficulty swallowing,Tooth/dental problems,Sore throat,  No sneezing, itching, ear ache, nasal congestion, post nasal drip,  Cardio-vascular:  No chest pain, Orthopnea, PND, anasarca,  dizziness, palpitations.no Bilateral lower extremity swelling  GI:  No heartburn, indigestion, abdominal pain, nausea, vomiting, diarrhea, change in bowel habits, loss of appetite, melena, blood in stool, hematemesis Resp:  no shortness of breath at rest. No dyspnea on exertion, No excess mucus, no productive cough, No non-productive cough, No coughing up of blood.No change in color of mucus.No wheezing. Skin:  no rash or lesions. No jaundice GU:  no dysuria, change in color of urine, no urgency or frequency. No straining to urinate.  No flank pain.  Musculoskeletal:  No joint pain or no joint swelling. No decreased range of motion. No back pain.  Psych:  No change in mood or affect. No depression or anxiety. No memory loss.  Neuro: no localizing neurological complaints, no tingling, no weakness, no double vision, no gait abnormality, no slurred speech, no confusion  All systems reviewed and apart from HOPI all are negative _______________________________________________________________________________________________ Past Medical History:   Past Medical History:  Diagnosis Date   Anemia    Anxiety    Bladder cancer (HCC)    Carotid artery disease (HCC)    Coronary artery disease    Depression    Diabetes mellitus without complication Type 2    Endometrial cancer (HCC) 2019   Family history of coronary artery disease    Fibromyalgia    Full dentures    GERD (gastroesophageal reflux disease)    History of kidney stones    Hyperlipidemia    Hypertension  Hypothyroidism    Peripheral neuropathy    legs and feet   Subclavian artery stenosis, right [facility])      Past Surgical History:  Procedure Laterality Date   ABDOMINAL HYSTERECTOMY  2019   BACK SURGERY  09/27/2008   lower   CAROTID-SUBCLAVIAN BYPASS GRAFT Right 05/09/2014   Procedure: Right Carotid Subclavian bypass with 7 mm hemashield graft.;  Surgeon: Nada Libman, MD;  Location: [facility] OR;  Service: Vascular;   Laterality: Right;   CORONARY ANGIOPLASTY WITH STENT PLACEMENT     5 vessel 2004, 2008?   CORONARY ARTERY BYPASS GRAFT  2019   quadruple   CYSTOSCOPY W/ URETERAL STENT PLACEMENT  01/14/2023   Procedure: CYSTOSCOPY WITH BILATERAL RETROGRADE PYELOGRAM/ RIGHT URETERAL STENT PLACEMENT;  Surgeon: Loletta Parish., MD;  Location: [facility] ORS;  Service: Urology;;   CYSTOSCOPY WITH RETROGRADE PYELOGRAM, URETEROSCOPY AND STENT PLACEMENT Right 03/11/2023   Procedure: CYSTOSCOPY WITH RETROGRADE PYELOGRAM, URETEROSCOPY AND STENT EXCHANGE;  Surgeon: Loletta Parish., MD;  Location: [facility] ORS;  Service: Urology;  Laterality: Right;   HOLMIUM LASER APPLICATION Right 03/11/2023   Procedure: HOLMIUM LASER APPLICATION;  Surgeon: Loletta Parish., MD;  Location: [facility] ORS;  Service: Urology;  Laterality: Right;   LEFT HEART CATHETERIZATION WITH CORONARY ANGIOGRAM N/A 04/08/2014   Procedure: LEFT HEART CATHETERIZATION WITH CORONARY ANGIOGRAM;  Surgeon: [name], MD;  Location: [facility] CATH LAB;  Service: Cardiovascular;  Laterality: N/A;   TRANSURETHRAL RESECTION OF BLADDER TUMOR N/A 01/14/2023   Procedure: TRANSURETHRAL RESECTION OF BLADDER TUMOR (TURBT);  Surgeon: Loletta Parish., MD;  Location: [facility] ORS;  Service: Urology;  Laterality: N/A;  45 MINS   TRANSURETHRAL RESECTION OF BLADDER TUMOR N/A 03/11/2023   Procedure: TRANSURETHRAL RESECTION OF BLADDER TUMOR (TURBT);  Surgeon: Loletta Parish., MD;  Location: [facility] ORS;  Service: Urology;  Laterality: N/A;   US ECHOCARDIOGRAPHY  12/30/2011   mild LVH, mild AOV sclerosis,trace MR,TR,trace/mild physiologic PI    Social History:  Ambulatory   independently       reports that she quit smoking about 3 years ago. Her smoking use included cigarettes. She started smoking about 23 years ago. She has a 2 pack-year smoking history. She has never used smokeless tobacco. She reports that she does not drink alcohol and does not use drugs.   Family History:    Family History  Problem Relation Age of Onset   Cancer Father        lung   Heart attack Father    ______________________________________________________________________________________________ Allergies: No Known Allergies   Prior to Admission medications   Medication Sig Start Date End Date Taking? Authorizing Provider  cyanocobalamin (VITAMIN B12) 1000 MCG tablet Take 1,000 mcg by mouth daily.    [provider]  ferrous sulfate 325 (65 FE) MG tablet Take 1 tablet (325 mg total) by mouth daily with breakfast. 01/17/23   Burnadette Pop, MD  hydrOXYzine (ATARAX) 25 MG tablet Take 25 mg by mouth every 6 (six) hours as needed for anxiety. 10/08/21   [provider]  levothyroxine (SYNTHROID) 125 MCG tablet Take 125 mcg by mouth daily before breakfast.    [provider]  naloxone (NARCAN) nasal spray 4 mg/0.1 mL Place 1 spray into the nose as needed (opioid overdose). 03/18/23   [provider]  omeprazole (PRILOSEC) 40 MG capsule Take 40 mg by mouth daily as needed (indigestion).    [provider]  oxybutynin (DITROPAN) 5 MG tablet Take 1  tablet (5 mg total) by mouth every 8 (eight) hours as needed for bladder spasms. 01/16/23   Burnadette Pop, MD  oxyCODONE (ROXICODONE) 15 MG immediate release tablet Take 15 mg by mouth every 6 (six) hours as needed for pain.    [provider]  polyethylene glycol (MIRALAX / GLYCOLAX) 17 g packet Take 17 g by mouth daily. Patient taking differently: Take 17 g by mouth daily as needed for moderate constipation. 01/17/23   Burnadette Pop, MD  pregabalin (LYRICA) 200 MG capsule Take 200 mg by mouth 3 (three) times daily.     [provider]  senna (SENOKOT) 8.6 MG tablet Take 2 tablets by mouth at bedtime.    [provider]  XTAMPZA ER 13.5 MG C12A Take 1 capsule by mouth in the morning and at bedtime. 08/13/21   [provider]     ___________________________________________________________________________________________________ Physical Exam:    08/25/2023    7:25 PM 08/25/2023    7:14 PM 08/25/2023    4:45 PM  Vitals with BMI  Systolic 107 155 161  Diastolic 61 117 71  Pulse 103 096 102    1. General:  in No  Acute distress   Chronically ill-appearing 2. Psychological: Alert and   Oriented 3. Head/ENT:    Dry Mucous Membranes                          Head Non traumatic, neck supple                           Poor Dentition 4. SKIN:  decreased Skin turgor,  Skin clean Dry and intact no rash    5. Heart: Regular rate and rhythm no  Murmur, no Rub or gallop 6. Lungs:  no wheezes or crackles   7. Abdomen: Soft,  non-tender, Non distended   obese  bowel sounds present 8. Lower extremities: no clubbing, cyanosis, no  edema 9. Neurologically Grossly intact, moving all 4 extremities equally  10. MSK: Normal range of motion Three-way Foley in place pink urine  Chart has been reviewed  ______________________________________________________________________________________________  Assessment/Plan 67 y.o. female with medical history significant of bladder tumor for which she underwent TURBT and ureteral stent placement on 4/1.  DM2, HTN, HLD, CAD, endometrial cancer s/p chemo/radiation, chronic anemia, hypothyroidism, peripheral neuropathy, anxiety/depression   right subclavian artery stenosis,   Admitted for   Acute urinary retention AKI ,Acute blood loss anemia due to hamaturia    Present on Admission:  Symptomatic anemia  Acquired hypothyroidism  Acute blood loss anemia  Coronary artery disease involving native coronary artery  Endometrial cancer (HCC)  Hydronephrosis of right kidney  Subclavian artery stenosis, right (HCC)  Uncontrolled type 2 diabetes mellitus with hyperglycemia, without long-term current use of insulin (HCC)  Acute urinary retention  QT prolongation     Acquired  hypothyroidism - Check TSH continue home medications Synthroid at 125 mcg po q day   Acute blood loss anemia In the setting of hematuria. Transfuse 2 units follow serial CBC  Coronary artery disease involving native coronary artery Chronic stable aim for hemoglobin around 8 given history of CAD Hold off on aspirin given hematuria  Endometrial cancer [facility]) Follow-up as an outpatient  Hydronephrosis of right kidney Appreciate urology involvement and consult in a.m.  Subclavian artery stenosis, right (HCC) Chronic  Uncontrolled type 2 diabetes mellitus with hyperglycemia, without long-term current use of insulin (HCC)  Order sliding scale Patient has several near hyperglycemia initially no evidence of DKA   Acute urinary retention Now status post three-way Foley appreciate urology input  QT prolongation - will monitor on tele avoid QT prolonging medications, rehydrate correct electrolytes   Other plan as per orders.  DVT prophylaxis:  SCD     Code Status:    Code Status: Prior FULL CODE  as per patient   I had personally discussed CODE STATUS with patient  ACP   none   Family Communication:   Family not at  Bedside    Diet daibetic diet   Disposition Plan:          To home once workup is complete and patient is stable   Following barriers for discharge:                                                          Electrolytes corrected                               Anemia corrected h/H stable                                                        Will need consultants to evaluate patient prior to discharge       Consult Orders  (From admission, onward)           Start     Ordered   08/25/23 1947  Consult to hospitalist  Once       Provider:  (Not yet assigned)  Question Answer Comment  Place call to: [facility]   Reason for Consult Admit      08/25/23 1946                               Would benefit from PT/OT eval prior to DC  Ordered                      Consults called: Urology is aware of will see in the morning   Admission status:  ED Disposition     ED Disposition  Admit   Condition  --   Comment  The patient appears reasonably stabilized for admission considering the current resources, flow, and capabilities available in the ED at this time, and I doubt any other [facility] requiring further screening and/or treatment in the ED prior to admission is  present.           Obs    Level of care stepdown   tele indefinitely please discontinue once patient no longer qualifies COVID-19 Labs    Aulton Routt 08/25/2023, 10:48 PM    [facility]     after 2 AM please page floor coverage PA If 7AM-7PM, please contact the day team taking care of the patient using [email or web address]

## 2023-08-25 NOTE — Consult Note (Signed)
Urology Consult   Physician requesting consult: Estanislado Pandy, DO  Reason for consult: Gross hematuria, anemia  History of Present Illness: Diana Kennedy is a 67 y.o. female seen in consultation from Dr. Maple Hudson for evaluation of gross hematuria, urinary retention, and symptomatic anemia.  She has a history of gross hematuria for >6 months.  She initially presented with gross hematuria with clots and symptomatic anemia in April 2024.  She was taken to the operating room on 01/14/2023 for cystoscopy with clot evacuation and transurethral resection of a bladder tumor.  She was also found to have a right renal calculus.  A right ureteral stent was placed at that time.  She was found to have a 2 cm papillary tumor just lateral to the left UO.  Pathology showed high-grade T1 bladder cancer.  She again presented to the emergency room in June 2024 with hematuria and flank pain.  She underwent cystoscopy with bilateral retrograde pyelograms, right ureteroscopy with laser lithotripsy and stent exchange and reresection of the bladder tumor base in June 2024.  Pathology showed no recurrent tumor.  She continued to have intermittent gross hematuria but did not return for stent removal until 08/01/2023.  Her stent was removed at that time.  She has continued to have gross hematuria with clots.  Due to worsening hematuria with clots and retention she presented to the emergency room today.  A Foley catheter was placed with return of significant bloody urine.  She was also found to be severely anemic with a hemoglobin of 5.4.  Urinalysis showed >50 RBCs, 0-5 WBCs, and rare bacteria.  She had a three-way Foley placed and has been started on continuous bladder irrigation in the emergency department. CT imaging showed right renal scarring, unchanged severe right hydroureteronephrosis with normal caliber mid to distal right ureter, fullness of the left collecting system with no frank hydronephrosis, no renal or ureteral calculi, and  high density material within the urinary bladder. She is not having any flank or abdominal pain.  No fevers or chills.  No dysuria. She does have a history of endometrial cancer status post pelvic radiation in 2023.   Past Medical History:  Diagnosis Date   Anemia    Anxiety    Bladder cancer (HCC)    Carotid artery disease (HCC)    Coronary artery disease    Depression    Diabetes mellitus without complication Type 2    Endometrial cancer (HCC) 2019   Family history of coronary artery disease    Fibromyalgia    Full dentures    GERD (gastroesophageal reflux disease)    History of kidney stones    Hyperlipidemia    Hypertension    Hypothyroidism    Peripheral neuropathy    legs and feet   Subclavian artery stenosis, right [facility])     Past Surgical History:  Procedure Laterality Date   ABDOMINAL HYSTERECTOMY  2019   BACK SURGERY  09/27/2008   lower   CAROTID-SUBCLAVIAN BYPASS GRAFT Right 05/09/2014   Procedure: Right Carotid Subclavian bypass with 7 mm hemashield graft.;  Surgeon: Nada Libman, MD;  Location: [facility] OR;  Service: Vascular;  Laterality: Right;   CORONARY ANGIOPLASTY WITH STENT PLACEMENT     5 vessel 2004, 2008?   CORONARY ARTERY BYPASS GRAFT  2019   quadruple   CYSTOSCOPY W/ URETERAL STENT PLACEMENT  01/14/2023   Procedure: CYSTOSCOPY WITH BILATERAL RETROGRADE PYELOGRAM/ RIGHT URETERAL STENT PLACEMENT;  Surgeon: Loletta Parish., MD;  Location: [facility] ORS;  Service: Urology;;   CYSTOSCOPY WITH RETROGRADE PYELOGRAM, URETEROSCOPY AND STENT PLACEMENT Right 03/11/2023   Procedure: CYSTOSCOPY WITH RETROGRADE PYELOGRAM, URETEROSCOPY AND STENT EXCHANGE;  Surgeon: Loletta Parish., MD;  Location: [facility] ORS;  Service: Urology;  Laterality: Right;   HOLMIUM LASER APPLICATION Right 03/11/2023   Procedure: HOLMIUM LASER APPLICATION;  Surgeon: Loletta Parish., MD;  Location: [facility] ORS;  Service: Urology;  Laterality: Right;   LEFT HEART CATHETERIZATION WITH CORONARY  ANGIOGRAM N/A 04/08/2014   Procedure: LEFT HEART CATHETERIZATION WITH CORONARY ANGIOGRAM;  Surgeon: [name], MD;  Location: [facility] CATH LAB;  Service: Cardiovascular;  Laterality: N/A;   TRANSURETHRAL RESECTION OF BLADDER TUMOR N/A 01/14/2023   Procedure: TRANSURETHRAL RESECTION OF BLADDER TUMOR (TURBT);  Surgeon: Loletta Parish., MD;  Location: [facility] ORS;  Service: Urology;  Laterality: N/A;  45 MINS   TRANSURETHRAL RESECTION OF BLADDER TUMOR N/A 03/11/2023   Procedure: TRANSURETHRAL RESECTION OF BLADDER TUMOR (TURBT);  Surgeon: Loletta Parish., MD;  Location: [facility] ORS;  Service: Urology;  Laterality: N/A;   US ECHOCARDIOGRAPHY  12/30/2011   mild LVH, mild AOV sclerosis,trace MR,TR,trace/mild physiologic PI    Medications: Scheduled Meds:  sodium chloride   Intravenous Once   sodium chloride   Intravenous Once   insulin aspart  0-9 Units Subcutaneous Q4H   Continuous Infusions:  sodium chloride irrigation     PRN Meds:.   Scheduled Meds:  sodium chloride   Intravenous Once   sodium chloride   Intravenous Once   insulin aspart  0-9 Units Subcutaneous Q4H   Continuous Infusions:  sodium chloride irrigation     PRN Meds:.  Allergies: No Known Allergies  Family History  Problem Relation Age of Onset   Cancer Father        lung   Heart attack Father     Social History:  reports that she quit smoking about 3 years ago. Her smoking use included cigarettes. She started smoking about 23 years ago. She has a 2 pack-year smoking history. She has never used smokeless tobacco. She reports that she does not drink alcohol and does not use drugs.  ROS: A complete review of systems was performed.  All systems are negative except for pertinent findings as noted.  Physical Exam:  Vital signs in last 24 hours: Temp:  [98 F (36.7 C)-98.5 F (36.9 C)] 98.5 F (36.9 C) (11/28 2030) Pulse Rate:  [80-118] 80 (11/28 2115) Resp:  [11-20] 14 (11/28 2115) BP: (97-155)/(56-117)  113/63 (11/28 2115) SpO2:  [95 %-100 %] 100 % (11/28 2115) Weight:  [90.7 kg] 90.7 kg (11/28 1523) GENERAL APPEARANCE:  Well appearing, well developed, well nourished, NAD HEENT:  Atraumatic, normocephalic, oropharynx clear NECK:  Supple  CHEST:  clear bilaterally CV:  RRR ABDOMEN:  Soft, non-tender, no masses EXTREMITIES:  Moves all extremities well, without clubbing, cyanosis, or edema NEUROLOGIC:  Alert and oriented x 3,  CN II-XII grossly intact MENTAL STATUS:  appropriate BACK:  Non-tender to palpation, No CVAT SKIN:  Warm, dry, and intact GU:  foley in place draining cranberry colored urine  Laboratory Data:  Recent Labs    08/25/23 1620  WBC 7.7  HGB 5.4*  HCT 17.4*  PLT 181    Recent Labs    08/25/23 1620  NA 132*  K 5.0  CL 93*  GLUCOSE 589*  BUN 40*  CALCIUM 9.0  CREATININE 1.53*     Results for orders placed or performed during the hospital encounter  of 08/25/23 (from the past 24 hour(s))  CBC     Status: Abnormal   Collection Time: 08/25/23  4:20 PM  Result Value Ref Range   WBC 7.7 4.0 - 10.5 K/uL   RBC 1.74 (L) 3.87 - 5.11 MIL/uL   Hemoglobin 5.4 (LL) 12.0 - 15.0 g/dL   HCT 96.0 (L) 45.4 - 09.8 %   MCV 100.0 80.0 - 100.0 fL   MCH 31.0 26.0 - 34.0 pg   MCHC 31.0 30.0 - 36.0 g/dL   RDW 11.9 (H) 14.7 - 82.9 %   Platelets 181 150 - 400 K/uL   nRBC 0.4 (H) 0.0 - 0.2 %  Basic metabolic panel     Status: Abnormal   Collection Time: 08/25/23  4:20 PM  Result Value Ref Range   Sodium 132 (L) 135 - 145 mmol/L   Potassium 5.0 3.5 - 5.1 mmol/L   Chloride 93 (L) 98 - 111 mmol/L   CO2 26 22 - 32 mmol/L   Glucose, Bld 589 (HH) 70 - 99 mg/dL   BUN 40 (H) 8 - 23 mg/dL   Creatinine, Ser 5.62 (H) 0.44 - 1.00 mg/dL   Calcium 9.0 8.9 - 13.0 mg/dL   GFR, Estimated 37 (L) >60 mL/min   Anion gap 13 5 - 15  Troponin I (High Sensitivity)     Status: None   Collection Time: 08/25/23  4:20 PM  Result Value Ref Range   Troponin I (High Sensitivity) 4 <18 ng/L   Type and screen [facility]     Status: None (Preliminary result)   Collection Time: 08/25/23  4:20 PM  Result Value Ref Range   ABO/RH(D) A NEG    Antibody Screen NEG    Sample Expiration 08/28/2023,2359    Unit Number [id number]    Blood Component Type RED CELLS,LR    Unit division 00    Status of Unit ISSUED    Transfusion Status OK TO TRANSFUSE    Crossmatch Result      Compatible Performed at [facility], [address] [place], [place] [address]    Unit Number [id number]    Blood Component Type RED CELLS,LR    Unit division 00    Status of Unit ALLOCATED    Transfusion Status OK TO TRANSFUSE    Crossmatch Result Compatible   Creatinine, urine, random     Status: None   Collection Time: 08/25/23  4:40 PM  Result Value Ref Range   Creatinine, Urine 29 mg/dL  Sodium, urine, random     Status: None   Collection Time: 08/25/23  4:40 PM  Result Value Ref Range   Sodium, Ur 24 mmol/L  Urinalysis, w/ Reflex to Culture (Infection Suspected) -Urine, Clean Catch     Status: Abnormal   Collection Time: 08/25/23  4:47 PM  Result Value Ref Range   Specimen Source URINE, CLEAN CATCH    Color, Urine RED (A) YELLOW   APPearance HAZY (A) CLEAR   Specific Gravity, Urine 1.021 1.005 - 1.030   pH 6.0 5.0 - 8.0   Glucose, UA >=500 (A) NEGATIVE mg/dL   Hgb urine dipstick MODERATE (A) NEGATIVE   Bilirubin Urine NEGATIVE NEGATIVE   Ketones, ur NEGATIVE NEGATIVE mg/dL   Protein, ur 403 (A) NEGATIVE mg/dL   Nitrite NEGATIVE NEGATIVE   Leukocytes,Ua NEGATIVE NEGATIVE   RBC / HPF >50 0 - 5 RBC/hpf   WBC, UA 0-5 0 - 5 WBC/hpf   Bacteria, UA RARE (  A) NONE SEEN   Squamous Epithelial / HPF 0-5 0 - 5 /HPF   Mucus PRESENT   Prepare RBC (crossmatch)     Status: None   Collection Time: 08/25/23  5:38 PM  Result Value Ref Range   Order Confirmation      ORDER PROCESSED BY BLOOD BANK Performed at [facility], [address] [place], [place] [address]   Troponin I (High Sensitivity)     Status: None   Collection Time: 08/25/23  6:41 PM  Result Value Ref Range   Troponin I (High Sensitivity) 4 <18 ng/L  POC CBG, ED     Status: Abnormal   Collection Time: 08/25/23  8:08 PM  Result Value Ref Range   Glucose-Capillary 393 (H) 70 - 99 mg/dL   Comment 1 Notify RN    No results found for this or any previous visit (from the past 240 hour(s)).  Renal Function: Recent Labs    08/25/23 1620  CREATININE 1.53*   Estimated Creatinine Clearance: 41.2 mL/min (A) (by C-G formula based on SCr of 1.53 mg/dL (H)).  Radiologic Imaging: CT Angio Chest PE W and/or Wo Contrast  Result Date: 08/25/2023 CLINICAL DATA:  Hypoxia EXAM: CT ANGIOGRAPHY CHEST WITH CONTRAST TECHNIQUE: Multidetector CT imaging of the chest was performed using the standard protocol during bolus administration of intravenous contrast. Multiplanar CT image reconstructions and MIPs were obtained to evaluate the vascular anatomy. RADIATION DOSE REDUCTION: This exam was performed according to the departmental dose-optimization program which includes automated exposure control, adjustment of the mA and/or kV according to patient size and/or use of iterative reconstruction technique. CONTRAST:  75mL OMNIPAQUE IOHEXOL 350 MG/ML SOLN COMPARISON:  Chest x-ray 08/25/2023, CT chest 05/07/2023 FINDINGS: Cardiovascular: Satisfactory opacification of the pulmonary arteries to the segmental level. No evidence of pulmonary embolism. Moderate aortic atherosclerosis. No aneurysm or dissection. Post CABG changes. Coronary vascular calcification. Borderline to mild cardiomegaly. No pericardial effusion Mediastinum/Nodes: Midline trachea. No thyroid mass. No suspicious lymph nodes. Esophagus within normal limits. Lungs/Pleura: No pleural effusion or pneumothorax. Atelectasis or scarring in the lingula. Subsegmental atelectasis at both lung bases. Upper Abdomen: No acute findings.  Musculoskeletal: Post sternotomy changes. No acute osseous abnormality. Review of the MIP images confirms the above findings. IMPRESSION: 1. Negative for acute pulmonary embolus or aortic dissection. 2. Borderline to mild cardiomegaly. Post CABG changes. 3. Low lung volumes with subsegmental atelectasis at the lung bases. 4. Aortic atherosclerosis. Aortic Atherosclerosis (ICD10-I70.0). Electronically Signed   By: Jasmine Pang M.D.   On: 08/25/2023 18:50   CT ABDOMEN PELVIS W CONTRAST  Result Date: 08/25/2023 CLINICAL DATA:  UTI, recurrent/complicated (Female). History of bladder cancer and nephrolithiasis. EXAM: CT ABDOMEN AND PELVIS WITH CONTRAST TECHNIQUE: Multidetector CT imaging of the abdomen and pelvis was performed using the standard protocol following bolus administration of intravenous contrast. RADIATION DOSE REDUCTION: This exam was performed according to the departmental dose-optimization program which includes automated exposure control, adjustment of the mA and/or kV according to patient size and/or use of iterative reconstruction technique. CONTRAST:  75mL OMNIPAQUE IOHEXOL 350 MG/ML SOLN COMPARISON:  CT chest abdomen pelvis 05/07/2023, CT abdomen pelvis 03/07/2023 FINDINGS: Lower chest: Please see separately dictated CT angiography chest 08/25/2023. Hepatobiliary: The hepatic parenchyma is diffusely hypodense compared to the splenic parenchyma consistent with fatty infiltration. No focal liver abnormality. No gallstones, gallbladder wall thickening, or pericholecystic fluid. No biliary dilatation. Pancreas: No focal lesion. Normal pancreatic contour. No surrounding inflammatory changes. No main pancreatic ductal dilatation.  Spleen: Normal in size without focal abnormality. Adrenals/Urinary Tract: No adrenal nodule bilaterally. Interval removal right ureteral stent. Bilateral kidneys enhance symmetrically. Right renal scarring. Similar-appearing severe right hydroureteronephrosis. The mid to  distal right uterus normal in caliber. Fullness of the left collecting system with no frank hydronephrosis. No left hydroureter. No nephroureterolithiasis bilaterally. High density material within the urinary bladder lumen. Foley catheter tip and balloon within the urinary bladder lumen. No urinary bladder wall thickening. Posterior right urinary bladder diverticula (4:312). On delayed imaging, there is no urothelial wall thickening and there are no filling defects in the opacified portions of the left collecting system or ureter. Filling defect of the right renal pelvis and proximal hydroureter on delayed imaging. Stomach/Bowel: Stomach is within normal limits. No evidence of bowel wall thickening or dilatation. Stool throughout the colon. Appendix appears normal. Vascular/Lymphatic: Infrarenal abdominal aorta measures up to 3 cm. No iliac aneurysm. Severe atherosclerotic plaque of the aorta and its branches. No abdominal, pelvic, or inguinal lymphadenopathy. Reproductive: Status post hysterectomy. No adnexal masses. Other: No intraperitoneal free fluid. No intraperitoneal free gas. No organized fluid collection. Musculoskeletal: No abdominal wall hernia or abnormality. No suspicious lytic or blastic osseous lesions. No acute displaced fracture. L4-L5 posterolateral interbody surgical hardware fusion. IMPRESSION: 1. Please see separately dictated CT angiography chest 08/25/2023. 2. High density material within the urinary bladder lumen - finding may represent blood products versus less likely mass. Associated similar-appearing severe right hydronephrosis and proximal right hydroureter with associated filling defect of the right renal pelvis and proximal hydroureter on delayed imaging - question underlying blood products versus mass. No nephroureterolithiasis. Interval removal of a right ureteral stent. Recommend urologic consultation. 3. 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 Recommend follow-up  ultrasound every 3 years. (Ref.: J Vasc Surg. 2018; 67:2-77 and J Am Coll Radiol 2013;10(10):789-794.). Aortic aneurysm NOS (ICD10-I71.9). 4.  Aortic Atherosclerosis (ICD10-I70.0)-severe. Electronically Signed   By: Tish Frederickson M.D.   On: 08/25/2023 18:48   DG Chest Portable 1 View  Result Date: 08/25/2023 CLINICAL DATA:  Hypoxia. EXAM: PORTABLE CHEST 1 VIEW COMPARISON:  05/07/2023. FINDINGS: Cardiac silhouette is normal in size. No mediastinal or hilar masses. There is hazy opacity at the left lung base. Mild opacities noted in medial right lung base. Remainder of the lungs is clear. No pleural effusion or pneumothorax. Right anterior chest wall Port-A-Cath is stable. Skeletal structures are grossly intact. IMPRESSION: Bibasilar opacities, left greater than right, which may be due to atelectasis, pneumonia or a combination. No pulmonary edema. Electronically Signed   By: Amie Portland M.D.   On: 08/25/2023 16:03    I independently reviewed the above imaging studies.  Impression/Recommendation Gross hematuria with clot retention Anemia History of bladder cancer Right hydronephrosis - appears chronic AKI  I hand irrigated the foley without return of clots.  CBI continued with urine light pink to cranberry in color.  Continue foley and CBI Send urine for culture Recommend beginning antibiotics pending urine culture results Will likely need cystoscopy with clot evacuation, fulguration of bleeding, retrograde pyelograms - possible tomorrow if anemia improved and BP stable. Will follow    Di Kindle 08/25/2023, 10:19 PM

## 2023-08-25 NOTE — Assessment & Plan Note (Signed)
Chronic stable aim for hemoglobin around 8 given history of CAD Hold off on aspirin given hematuria

## 2023-08-25 NOTE — ED Provider Notes (Signed)
[place] EMERGENCY DEPARTMENT AT [facility] Provider Note   CSN: [id number] Arrival date & time: 08/25/23  1517     History  Chief Complaint  Patient presents with   Urinary Retention    Diana Kennedy is a 67 y.o. female.  67 year old female present emergency department for urinary retention.  She has had gross hematuria for the past 2-1/2 months.  Recent stent placement and removal by urology a couple weeks ago which was supposed to resolve hematuria.  Unfortunately it is.  She has been passing large clots.  She has had some intermittent retention (oriented over the same duration but usually resolves after a couple hours.  She last urinated last night and has been unable to do so since then.  Has suprapubic discomfort consistent with prior episodes of urinary retention.  Patient noted to be hypoxic on monitor during interview down to 88% with good waveform.  Denies having shortness of breath, but notes some intermittent chest pressure/heaviness for the past week and a half or so.        Home Medications Prior to Admission medications   Medication Sig Start Date End Date Taking? Authorizing Provider  cyanocobalamin (VITAMIN B12) 1000 MCG tablet Take 1,000 mcg by mouth daily.    [provider]  ferrous sulfate 325 (65 FE) MG tablet Take 1 tablet (325 mg total) by mouth daily with breakfast. 01/17/23   Burnadette Pop, MD  hydrOXYzine (ATARAX) 25 MG tablet Take 25 mg by mouth every 6 (six) hours as needed for anxiety. 10/08/21   [provider]  levothyroxine (SYNTHROID) 125 MCG tablet Take 125 mcg by mouth daily before breakfast.    [provider]  naloxone (NARCAN) nasal spray 4 mg/0.1 mL Place 1 spray into the nose as needed (opioid overdose). 03/18/23   [provider]  omeprazole (PRILOSEC) 40 MG capsule Take 40 mg by mouth daily as needed (indigestion).    [provider]  oxybutynin (DITROPAN) 5 MG tablet Take 1 tablet (5  mg total) by mouth every 8 (eight) hours as needed for bladder spasms. 01/16/23   Burnadette Pop, MD  oxyCODONE (ROXICODONE) 15 MG immediate release tablet Take 15 mg by mouth every 6 (six) hours as needed for pain.    [provider]  polyethylene glycol (MIRALAX / GLYCOLAX) 17 g packet Take 17 g by mouth daily. Patient taking differently: Take 17 g by mouth daily as needed for moderate constipation. 01/17/23   Burnadette Pop, MD  pregabalin (LYRICA) 200 MG capsule Take 200 mg by mouth 3 (three) times daily.     [provider]  senna (SENOKOT) 8.6 MG tablet Take 2 tablets by mouth at bedtime.    [provider]  XTAMPZA ER 13.5 MG C12A Take 1 capsule by mouth in the morning and at bedtime. 08/13/21   [provider]      Allergies    Patient has no known allergies.    Review of Systems   Review of Systems  Physical Exam Updated Vital Signs BP 113/65   Pulse 80   Temp 98.1 F (36.7 C) (Oral)   Resp 12   Ht 5\' 7"  (1.702 m)   Wt 90.7 kg   SpO2 98%   BMI 31.32 kg/m  Physical Exam Vitals reviewed.  Constitutional:      Appearance: She is obese.  HENT:     Head: Normocephalic.     Nose: Nose normal.  Cardiovascular:  Rate and Rhythm: Regular rhythm. Tachycardia present.  Pulmonary:     Effort: Pulmonary effort is normal.     Breath sounds: Normal breath sounds. No wheezing, rhonchi or rales.  Abdominal:     General: Abdomen is flat. There is no distension.     Tenderness: There is abdominal tenderness (suprapubic). There is no guarding or rebound.  Musculoskeletal:     Right lower leg: No edema.     Left lower leg: No edema.  Skin:    General: Skin is warm and dry.     Capillary Refill: Capillary refill takes less than 2 seconds.  Neurological:     Mental Status: She is alert and oriented to person, place, and time.  Psychiatric:        Mood and Affect: Mood normal.        Behavior: Behavior normal.     ED Results / Procedures  / Treatments   Labs (all labs ordered are listed, but only abnormal results are displayed) Labs Reviewed  CBC - Abnormal; Notable for the following components:      Result Value   RBC 1.74 (*)    Hemoglobin 5.4 (*)    HCT 17.4 (*)    RDW 17.4 (*)    nRBC 0.4 (*)    All other components within normal limits  BASIC METABOLIC PANEL - Abnormal; Notable for the following components:   Sodium 132 (*)    Chloride 93 (*)    Glucose, Bld 589 (*)    BUN 40 (*)    Creatinine, Ser 1.53 (*)    GFR, Estimated 37 (*)    All other components within normal limits  URINALYSIS, W/ REFLEX TO CULTURE (INFECTION SUSPECTED) - Abnormal; Notable for the following components:   Color, Urine RED (*)    APPearance HAZY (*)    Glucose, UA >=500 (*)    Hgb urine dipstick MODERATE (*)    Protein, ur 100 (*)    Bacteria, UA RARE (*)    All other components within normal limits  CBG MONITORING, ED - Abnormal; Notable for the following components:   Glucose-Capillary 393 (*)    All other components within normal limits  URINE CULTURE  CREATININE, URINE, RANDOM  SODIUM, URINE, RANDOM  HEMOGLOBIN A1C  CK  MAGNESIUM  PHOSPHORUS  OSMOLALITY, URINE  OSMOLALITY  PROTIME-INR  TSH  PREALBUMIN  HEPATIC FUNCTION PANEL  COMPREHENSIVE METABOLIC PANEL  TYPE AND SCREEN  PREPARE RBC (CROSSMATCH)  TROPONIN I (HIGH SENSITIVITY)  TROPONIN I (HIGH SENSITIVITY)    EKG None  Radiology CT Angio Chest PE W and/or Wo Contrast  Result Date: 08/25/2023 CLINICAL DATA:  Hypoxia EXAM: CT ANGIOGRAPHY CHEST WITH CONTRAST TECHNIQUE: Multidetector CT imaging of the chest was performed using the standard protocol during bolus administration of intravenous contrast. Multiplanar CT image reconstructions and MIPs were obtained to evaluate the vascular anatomy. RADIATION DOSE REDUCTION: This exam was performed according to the departmental dose-optimization program which includes automated exposure control, adjustment of the mA  and/or kV according to patient size and/or use of iterative reconstruction technique. CONTRAST:  75mL OMNIPAQUE IOHEXOL 350 MG/ML SOLN COMPARISON:  Chest x-ray 08/25/2023, CT chest 05/07/2023 FINDINGS: Cardiovascular: Satisfactory opacification of the pulmonary arteries to the segmental level. No evidence of pulmonary embolism. Moderate aortic atherosclerosis. No aneurysm or dissection. Post CABG changes. Coronary vascular calcification. Borderline to mild cardiomegaly. No pericardial effusion Mediastinum/Nodes: Midline trachea. No thyroid mass. No suspicious lymph nodes. Esophagus within normal limits. Lungs/Pleura: No pleural  effusion or pneumothorax. Atelectasis or scarring in the lingula. Subsegmental atelectasis at both lung bases. Upper Abdomen: No acute findings. Musculoskeletal: Post sternotomy changes. No acute osseous abnormality. Review of the MIP images confirms the above findings. IMPRESSION: 1. Negative for acute pulmonary embolus or aortic dissection. 2. Borderline to mild cardiomegaly. Post CABG changes. 3. Low lung volumes with subsegmental atelectasis at the lung bases. 4. Aortic atherosclerosis. Aortic Atherosclerosis (ICD10-I70.0). Electronically Signed   By: Jasmine Pang M.D.   On: 08/25/2023 18:50   CT ABDOMEN PELVIS W CONTRAST  Result Date: 08/25/2023 CLINICAL DATA:  UTI, recurrent/complicated (Female). History of bladder cancer and nephrolithiasis. EXAM: CT ABDOMEN AND PELVIS WITH CONTRAST TECHNIQUE: Multidetector CT imaging of the abdomen and pelvis was performed using the standard protocol following bolus administration of intravenous contrast. RADIATION DOSE REDUCTION: This exam was performed according to the departmental dose-optimization program which includes automated exposure control, adjustment of the mA and/or kV according to patient size and/or use of iterative reconstruction technique. CONTRAST:  75mL OMNIPAQUE IOHEXOL 350 MG/ML SOLN COMPARISON:  CT chest abdomen pelvis  05/07/2023, CT abdomen pelvis 03/07/2023 FINDINGS: Lower chest: Please see separately dictated CT angiography chest 08/25/2023. Hepatobiliary: The hepatic parenchyma is diffusely hypodense compared to the splenic parenchyma consistent with fatty infiltration. No focal liver abnormality. No gallstones, gallbladder wall thickening, or pericholecystic fluid. No biliary dilatation. Pancreas: No focal lesion. Normal pancreatic contour. No surrounding inflammatory changes. No main pancreatic ductal dilatation. Spleen: Normal in size without focal abnormality. Adrenals/Urinary Tract: No adrenal nodule bilaterally. Interval removal right ureteral stent. Bilateral kidneys enhance symmetrically. Right renal scarring. Similar-appearing severe right hydroureteronephrosis. The mid to distal right uterus normal in caliber. Fullness of the left collecting system with no frank hydronephrosis. No left hydroureter. No nephroureterolithiasis bilaterally. High density material within the urinary bladder lumen. Foley catheter tip and balloon within the urinary bladder lumen. No urinary bladder wall thickening. Posterior right urinary bladder diverticula (4:312). On delayed imaging, there is no urothelial wall thickening and there are no filling defects in the opacified portions of the left collecting system or ureter. Filling defect of the right renal pelvis and proximal hydroureter on delayed imaging. Stomach/Bowel: Stomach is within normal limits. No evidence of bowel wall thickening or dilatation. Stool throughout the colon. Appendix appears normal. Vascular/Lymphatic: Infrarenal abdominal aorta measures up to 3 cm. No iliac aneurysm. Severe atherosclerotic plaque of the aorta and its branches. No abdominal, pelvic, or inguinal lymphadenopathy. Reproductive: Status post hysterectomy. No adnexal masses. Other: No intraperitoneal free fluid. No intraperitoneal free gas. No organized fluid collection. Musculoskeletal: No abdominal wall  hernia or abnormality. No suspicious lytic or blastic osseous lesions. No acute displaced fracture. L4-L5 posterolateral interbody surgical hardware fusion. IMPRESSION: 1. Please see separately dictated CT angiography chest 08/25/2023. 2. High density material within the urinary bladder lumen - finding may represent blood products versus less likely mass. Associated similar-appearing severe right hydronephrosis and proximal right hydroureter with associated filling defect of the right renal pelvis and proximal hydroureter on delayed imaging - question underlying blood products versus mass. No nephroureterolithiasis. Interval removal of a right ureteral stent. Recommend urologic consultation. 3. 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 Recommend follow-up ultrasound every 3 years. (Ref.: J Vasc Surg. 2018; 67:2-77 and J Am Coll Radiol 2013;10(10):789-794.). Aortic aneurysm NOS (ICD10-I71.9). 4.  Aortic Atherosclerosis (ICD10-I70.0)-severe. Electronically Signed   By: Tish Frederickson M.D.   On: 08/25/2023 18:48   DG Chest Portable 1 View  Result Date: 08/25/2023 CLINICAL DATA:  Hypoxia.  EXAM: PORTABLE CHEST 1 VIEW COMPARISON:  05/07/2023. FINDINGS: Cardiac silhouette is normal in size. No mediastinal or hilar masses. There is hazy opacity at the left lung base. Mild opacities noted in medial right lung base. Remainder of the lungs is clear. No pleural effusion or pneumothorax. Right anterior chest wall Port-A-Cath is stable. Skeletal structures are grossly intact. IMPRESSION: Bibasilar opacities, left greater than right, which may be due to atelectasis, pneumonia or a combination. No pulmonary edema. Electronically Signed   By: Amie Portland M.D.   On: 08/25/2023 16:03    Procedures .Critical Care  Performed by: Coral Spikes, DO Authorized by: Coral Spikes, DO   Critical care provider statement:    Critical care time (minutes):  30   Critical care time was exclusive of:  Separately  billable procedures and treating other patients and teaching time   Critical care was necessary to treat or prevent imminent or life-threatening deterioration of the following conditions:  Shock, respiratory failure, circulatory failure, dehydration and renal failure   Critical care was time spent personally by me on the following activities:  Development of treatment plan with patient or surrogate, discussions with consultants, evaluation of patient's response to treatment, examination of patient, interpretation of cardiac output measurements, obtaining history from patient or surrogate, ordering and performing treatments and interventions, ordering and review of laboratory studies, ordering and review of radiographic studies, pulse oximetry, re-evaluation of patient's condition and review of old charts   Care discussed with: admitting provider       Medications Ordered in ED Medications  sodium chloride irrigation 0.9 % 3,000 mL (3,000 mLs Irrigation New Bag/Given 08/25/23 2107)  0.9 %  sodium chloride infusion (Manually program via Guardrails IV Fluids) (0 mLs Intravenous Hold 08/25/23 2107)  insulin aspart (novoLOG) injection 0-9 Units (9 Units Subcutaneous Given 08/25/23 2107)  0.9 %  sodium chloride infusion (Manually program via Guardrails IV Fluids) (0 mLs Intravenous Hold 08/25/23 2108)  cefTRIAXone (ROCEPHIN) 1 g in sodium chloride 0.9 % 100 mL IVPB (1 g Intravenous New Bag/Given 08/25/23 2259)  sodium chloride 0.9 % bolus 1,000 mL (0 mLs Intravenous Stopped 08/25/23 2247)  insulin aspart (novoLOG) injection 8 Units (8 Units Intravenous Given 08/25/23 1842)  iohexol (OMNIPAQUE) 350 MG/ML injection 75 mL (75 mLs Intravenous Contrast Given 08/25/23 1800)    ED Course/ Medical Decision Making/ A&P Clinical Course as of 08/25/23 2322  Thu Aug 25, 2023  1554 PMH per chart review significant for DM2, HTN, HLD, CAD, right subclavian artery stenosis, endometrial cancer s/p chemo/radiation,  chronic anemia, hypothyroidism, peripheral neuropathy, anxiety/depression as well as bladder tumor for which she underwent TURBT and ureteral stent placement and removal" [TY]  1607 DG Chest Portable 1 View IMPRESSION: Bibasilar opacities, left greater than right, which may be due to atelectasis, pneumonia or a combination. No pulmonary edema.      [TY]  1727 Basic metabolic panel(!!) Appears to have pseudohyponatremia secondary to hyperglycemia.  Does not appear to be in DKA.  Bicarb normal.  No anion gap.  AKI noted suspect obstructive.  Will give fluids in anticipation for IV contrast.  Will also give insulin for her blood sugar. [TY]  1728 Urinalysis, w/ Reflex to Culture (Infection Suspected) -Urine, Clean Catch(!) Not Consistent with urinary tract infection [TY]  1728 Troponin I (High Sensitivity): 4 Cardiac chest pain less likely.   [TY]  1745 CBC(!!) Appears to have baseline hemoglobin of 9.5.  Was 5.4 today.  In setting of her hematuria suspect  blood loss.  Will transfuse. [TY]  1946 CT ABDOMEN PELVIS W CONTRAST IMPRESSION: 1. Please see separately dictated CT angiography chest 08/25/2023. 2. High density material within the urinary bladder lumen - finding may represent blood products versus less likely mass. Associated similar-appearing severe right hydronephrosis and proximal right hydroureter with associated filling defect of the right renal pelvis and proximal hydroureter on delayed imaging - question underlying blood products versus mass. No nephroureterolithiasis. Interval removal of a right ureteral stent. Recommend urologic consultation. 3. 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 Recommend follow-up ultrasound every 3 years. (Ref.: J Vasc Surg. 2018; 67:2-77 and J Am Coll Radiol 2013;10(10):789-794.). Aortic aneurysm NOS (ICD10-I71.9).   [TY]  2001 Spoke with urology, Dr. Pete Glatter, will see and evaluate patient.  Agrees with admission and TREATMENT.  [TY]  2019 Spoke with hospitalist; agrees to see the patient. [TY]    Clinical Course User Index [TY] Coral Spikes, DO                                 Medical Decision Making 67 year old female with past medical history of bladder cancer with recent stent removal presenting emergency department with urinary retention.  She is tachycardic, hypoxic, but hemodynamically stable.  No fever.  Does not appear to be in significant distress on exam.  Clear lungs.  Per chart review does not appear to be on blood thinners; last transurethral resection in June with urology.  Daughter at bedside reports that stents were removed 2 weeks ago and they have follow-up with urology on Monday.  Bladder scanned revealed some 760 mL of urine; given report of passing blood clots will start continuous bladder irrigation and plan for Foley placement.  With her history of bladder cancer will get CT abdomen to evaluate for mass/obstructive pathology.  Will get screening labs to evaluate for blood loss anemia and kidney injury.  Given her intermittent chest pain will get troponin and cardiac workup.  However, I am concerned with her history of cancer and hypoxia that she may have PE.  Will get CTA to evaluate for PE.  Given patient's new onset hypoxia anticipate admission.  See ED course for further MDM and disposition.   Amount and/or Complexity of Data Reviewed Labs: ordered. Decision-making details documented in ED Course. Radiology: ordered. Decision-making details documented in ED Course. ECG/medicine tests: ordered.  Risk Prescription drug management. Decision regarding hospitalization.          Final Clinical Impression(s) / ED Diagnoses Final diagnoses:  Acute urinary retention  AKI (acute kidney injury) (HCC)  Acute blood loss anemia    Rx / DC Orders ED Discharge Orders     None         Coral Spikes, DO 08/25/23 2322

## 2023-08-25 NOTE — Assessment & Plan Note (Signed)
-  Check TSH continue home medications Synthroid at  125 mcg po q day

## 2023-08-25 NOTE — ED Triage Notes (Addendum)
Patient report large amount of blood in urine x over 2 months. Seen by urology and they removed a stent saying it should stop the bleeding. Patient is now passing large clots, when she is able to void. Has not been able to go since last night.   Hx of bladder cancer.

## 2023-08-25 NOTE — Assessment & Plan Note (Signed)
>>  ASSESSMENT AND PLAN FOR ACUTE BLOOD LOSS ANEMIA WRITTEN ON 08/25/2023  8:30 PM BY Therisa Doyne, MD  In the setting of hematuria. Transfuse 2 units follow serial CBC

## 2023-08-26 ENCOUNTER — Observation Stay (HOSPITAL_COMMUNITY): Payer: Medicare Other

## 2023-08-26 ENCOUNTER — Encounter (HOSPITAL_COMMUNITY): Payer: Self-pay | Admitting: Internal Medicine

## 2023-08-26 ENCOUNTER — Observation Stay (HOSPITAL_COMMUNITY): Payer: Medicare Other | Admitting: Anesthesiology

## 2023-08-26 ENCOUNTER — Encounter (HOSPITAL_COMMUNITY)
Admission: EM | Disposition: A | Discharge status: Home / Self Care | Payer: Self-pay | Source: Home / Self Care | Attending: Family Medicine

## 2023-08-26 DIAGNOSIS — E039 Hypothyroidism, unspecified: Secondary | ICD-10-CM | POA: Diagnosis present

## 2023-08-26 DIAGNOSIS — R310 Gross hematuria: Secondary | ICD-10-CM | POA: Diagnosis present

## 2023-08-26 DIAGNOSIS — E1165 Type 2 diabetes mellitus with hyperglycemia: Secondary | ICD-10-CM | POA: Diagnosis present

## 2023-08-26 DIAGNOSIS — Z8542 Personal history of malignant neoplasm of other parts of uterus: Secondary | ICD-10-CM | POA: Diagnosis not present

## 2023-08-26 DIAGNOSIS — F419 Anxiety disorder, unspecified: Secondary | ICD-10-CM | POA: Diagnosis present

## 2023-08-26 DIAGNOSIS — G894 Chronic pain syndrome: Secondary | ICD-10-CM | POA: Diagnosis present

## 2023-08-26 DIAGNOSIS — R0902 Hypoxemia: Secondary | ICD-10-CM | POA: Diagnosis present

## 2023-08-26 DIAGNOSIS — R338 Other retention of urine: Secondary | ICD-10-CM | POA: Diagnosis not present

## 2023-08-26 DIAGNOSIS — C689 Malignant neoplasm of urinary organ, unspecified: Secondary | ICD-10-CM

## 2023-08-26 DIAGNOSIS — F32A Depression, unspecified: Secondary | ICD-10-CM | POA: Diagnosis present

## 2023-08-26 DIAGNOSIS — E669 Obesity, unspecified: Secondary | ICD-10-CM | POA: Diagnosis present

## 2023-08-26 DIAGNOSIS — C679 Malignant neoplasm of bladder, unspecified: Secondary | ICD-10-CM

## 2023-08-26 DIAGNOSIS — E1142 Type 2 diabetes mellitus with diabetic polyneuropathy: Secondary | ICD-10-CM | POA: Diagnosis present

## 2023-08-26 DIAGNOSIS — I2510 Atherosclerotic heart disease of native coronary artery without angina pectoris: Secondary | ICD-10-CM | POA: Diagnosis present

## 2023-08-26 DIAGNOSIS — D62 Acute posthemorrhagic anemia: Secondary | ICD-10-CM | POA: Diagnosis present

## 2023-08-26 DIAGNOSIS — N1330 Unspecified hydronephrosis: Secondary | ICD-10-CM | POA: Diagnosis present

## 2023-08-26 DIAGNOSIS — E785 Hyperlipidemia, unspecified: Secondary | ICD-10-CM | POA: Diagnosis present

## 2023-08-26 DIAGNOSIS — Z79890 Hormone replacement therapy (postmenopausal): Secondary | ICD-10-CM | POA: Diagnosis not present

## 2023-08-26 DIAGNOSIS — I10 Essential (primary) hypertension: Secondary | ICD-10-CM | POA: Diagnosis present

## 2023-08-26 DIAGNOSIS — N179 Acute kidney failure, unspecified: Secondary | ICD-10-CM | POA: Diagnosis present

## 2023-08-26 DIAGNOSIS — Z6831 Body mass index (BMI) 31.0-31.9, adult: Secondary | ICD-10-CM | POA: Diagnosis not present

## 2023-08-26 DIAGNOSIS — D649 Anemia, unspecified: Secondary | ICD-10-CM | POA: Diagnosis not present

## 2023-08-26 DIAGNOSIS — M797 Fibromyalgia: Secondary | ICD-10-CM | POA: Diagnosis present

## 2023-08-26 DIAGNOSIS — R339 Retention of urine, unspecified: Secondary | ICD-10-CM | POA: Diagnosis present

## 2023-08-26 DIAGNOSIS — R9431 Abnormal electrocardiogram [ECG] [EKG]: Secondary | ICD-10-CM | POA: Diagnosis present

## 2023-08-26 DIAGNOSIS — Z79891 Long term (current) use of opiate analgesic: Secondary | ICD-10-CM | POA: Diagnosis not present

## 2023-08-26 DIAGNOSIS — I708 Atherosclerosis of other arteries: Secondary | ICD-10-CM | POA: Diagnosis present

## 2023-08-26 DIAGNOSIS — N320 Bladder-neck obstruction: Secondary | ICD-10-CM | POA: Diagnosis present

## 2023-08-26 HISTORY — PX: TRANSURETHRAL RESECTION OF BLADDER TUMOR: SHX2575

## 2023-08-26 HISTORY — PX: CYSTOSCOPY W/ RETROGRADES: SHX1426

## 2023-08-26 HISTORY — PX: CYSTOSCOPY WITH FULGERATION: SHX6638

## 2023-08-26 LAB — GLUCOSE, CAPILLARY
Glucose-Capillary: 163 mg/dL — ABNORMAL HIGH (ref 70–99)
Glucose-Capillary: 196 mg/dL — ABNORMAL HIGH (ref 70–99)
Glucose-Capillary: 206 mg/dL — ABNORMAL HIGH (ref 70–99)
Glucose-Capillary: 169 mg/dL — ABNORMAL HIGH (ref 70–99)
Glucose-Capillary: 127 mg/dL — ABNORMAL HIGH (ref 70–99)
Glucose-Capillary: 152 mg/dL — ABNORMAL HIGH (ref 70–99)
Glucose-Capillary: 242 mg/dL — ABNORMAL HIGH (ref 70–99)

## 2023-08-26 LAB — PROTIME-INR
INR: 1 (ref 0.8–1.2)
Prothrombin Time: 13.7 s (ref 11.4–15.2)

## 2023-08-26 LAB — HEPATIC FUNCTION PANEL
ALT: 22 U/L (ref 0–44)
AST: 30 U/L (ref 15–41)
Albumin: 3 g/dL — ABNORMAL LOW (ref 3.5–5.0)
Alkaline Phosphatase: 89 U/L (ref 38–126)
Bilirubin, Direct: <0.1 mg/dL (ref 0.0–0.2)
Total Bilirubin: 0.5 mg/dL (ref <1.2)
Total Protein: 6.5 g/dL (ref 6.5–8.1)

## 2023-08-26 LAB — CK: Total CK: 32 U/L — ABNORMAL LOW (ref 38–234)

## 2023-08-26 LAB — COMPREHENSIVE METABOLIC PANEL
ALT: 23 U/L (ref 0–44)
AST: 29 U/L (ref 15–41)
Albumin: 2.9 g/dL — ABNORMAL LOW (ref 3.5–5.0)
Alkaline Phosphatase: 92 U/L (ref 38–126)
Anion gap: 8 (ref 5–15)
BUN: 32 mg/dL — ABNORMAL HIGH (ref 8–23)
CO2: 29 mmol/L (ref 22–32)
Calcium: 8.5 mg/dL — ABNORMAL LOW (ref 8.9–10.3)
Chloride: 101 mmol/L (ref 98–111)
Creatinine, Ser: 1.14 mg/dL — ABNORMAL HIGH (ref 0.44–1.00)
GFR, Estimated: 53 mL/min — ABNORMAL LOW (ref >60)
Glucose, Bld: 187 mg/dL — ABNORMAL HIGH (ref 70–99)
Potassium: 3.6 mmol/L (ref 3.5–5.1)
Sodium: 138 mmol/L (ref 135–145)
Total Bilirubin: 0.5 mg/dL (ref <1.2)
Total Protein: 6.5 g/dL (ref 6.5–8.1)

## 2023-08-26 LAB — OSMOLALITY: Osmolality: 305 mosm/kg — ABNORMAL HIGH (ref 275–295)

## 2023-08-26 LAB — HEMOGLOBIN A1C
Hgb A1c MFr Bld: 8.5 % — ABNORMAL HIGH (ref 4.8–5.6)
Mean Plasma Glucose: 197.25 mg/dL

## 2023-08-26 LAB — CBC
HCT: 23.4 % — ABNORMAL LOW (ref 36.0–46.0)
Hemoglobin: 7.1 g/dL — ABNORMAL LOW (ref 12.0–15.0)
MCH: 28.4 pg (ref 26.0–34.0)
MCHC: 30.3 g/dL (ref 30.0–36.0)
MCV: 93.6 fL (ref 80.0–100.0)
Platelets: 181 10*3/uL (ref 150–400)
RBC: 2.5 MIL/uL — ABNORMAL LOW (ref 3.87–5.11)
RDW: 20.7 % — ABNORMAL HIGH (ref 11.5–15.5)
WBC: 6.5 10*3/uL (ref 4.0–10.5)
nRBC: 0.8 % — ABNORMAL HIGH (ref 0.0–0.2)

## 2023-08-26 LAB — HEMOGLOBIN AND HEMATOCRIT, BLOOD
HCT: 25.1 % — ABNORMAL LOW (ref 36.0–46.0)
Hemoglobin: 7.8 g/dL — ABNORMAL LOW (ref 12.0–15.0)

## 2023-08-26 LAB — PHOSPHORUS: Phosphorus: 4 mg/dL (ref 2.5–4.6)

## 2023-08-26 LAB — MRSA NEXT GEN BY PCR, NASAL: MRSA by PCR Next Gen: NOT DETECTED

## 2023-08-26 LAB — PREPARE RBC (CROSSMATCH)

## 2023-08-26 LAB — TSH: TSH: 13.248 u[IU]/mL — ABNORMAL HIGH (ref 0.350–4.500)

## 2023-08-26 LAB — MAGNESIUM: Magnesium: 2.3 mg/dL (ref 1.7–2.4)

## 2023-08-26 LAB — OSMOLALITY, URINE: Osmolality, Ur: 491 mosm/kg (ref 300–900)

## 2023-08-26 SURGERY — CYSTOSCOPY, WITH RETROGRADE PYELOGRAM
Anesthesia: General

## 2023-08-26 SURGERY — CYSTOSCOPY WITH FULGERATION
Anesthesia: General

## 2023-08-26 MED ORDER — MIDAZOLAM HCL 5 MG/5ML IJ SOLN
INTRAMUSCULAR | Status: DC | PRN
  Administered 2023-08-26: 2 mg via INTRAVENOUS

## 2023-08-26 MED ORDER — ONDANSETRON HCL 4 MG/2ML IJ SOLN
INTRAMUSCULAR | Status: DC | PRN
  Administered 2023-08-26: 4 mg via INTRAVENOUS

## 2023-08-26 MED ORDER — FENTANYL CITRATE (PF) 100 MCG/2ML IJ SOLN
INTRAMUSCULAR | Status: AC
  Filled 2023-08-26: qty 2

## 2023-08-26 MED ORDER — PHENYLEPHRINE HCL (PRESSORS) 10 MG/ML IV SOLN
INTRAVENOUS | Status: DC | PRN
  Administered 2023-08-26 (×2): 80 ug via INTRAVENOUS
  Administered 2023-08-26: 160 ug via INTRAVENOUS
  Administered 2023-08-26: 80 ug via INTRAVENOUS

## 2023-08-26 MED ORDER — METHYLENE BLUE (ANTIDOTE) 1 % IV SOLN
INTRAVENOUS | Status: AC
  Filled 2023-08-26: qty 10

## 2023-08-26 MED ORDER — MIDAZOLAM HCL 2 MG/2ML IJ SOLN
INTRAMUSCULAR | Status: AC
Start: 2023-08-26 — End: ?
  Filled 2023-08-26: qty 2

## 2023-08-26 MED ORDER — ACETAMINOPHEN 325 MG PO TABS: 650.0000 mg | ORAL_TABLET | Freq: Four times a day (QID) | ORAL | Status: DC | PRN

## 2023-08-26 MED ORDER — PREGABALIN 75 MG PO CAPS
200.0000 mg | ORAL_CAPSULE | Freq: Three times a day (TID) | ORAL | Status: DC
  Administered 2023-08-26 – 2023-08-28 (×7): 200 mg via ORAL
  Filled 2023-08-26: qty 1
  Filled 2023-08-26: qty 2
  Filled 2023-08-26 (×2): qty 1
  Filled 2023-08-26: qty 2
  Filled 2023-08-26 (×2): qty 1

## 2023-08-26 MED ORDER — FENTANYL CITRATE (PF) 100 MCG/2ML IJ SOLN
INTRAMUSCULAR | Status: DC | PRN
  Administered 2023-08-26: 50 ug via INTRAVENOUS

## 2023-08-26 MED ORDER — IOHEXOL 300 MG/ML  SOLN
INTRAMUSCULAR | Status: DC | PRN
  Administered 2023-08-26: 12 mg

## 2023-08-26 MED ORDER — FENTANYL CITRATE PF 50 MCG/ML IJ SOSY
PREFILLED_SYRINGE | INTRAMUSCULAR | Status: AC
  Administered 2023-08-26: 50 ug via INTRAVENOUS
  Filled 2023-08-26: qty 1

## 2023-08-26 MED ORDER — ACETAMINOPHEN 650 MG RE SUPP: 650.0000 mg | Freq: Four times a day (QID) | RECTAL | Status: DC | PRN

## 2023-08-26 MED ORDER — MIDAZOLAM HCL 5 MG/5ML IJ SOLN: INTRAMUSCULAR | Status: DC | PRN

## 2023-08-26 MED ORDER — LIDOCAINE HCL (CARDIAC) PF 100 MG/5ML IV SOSY
PREFILLED_SYRINGE | INTRAVENOUS | Status: DC | PRN
  Administered 2023-08-26: 60 mg via INTRAVENOUS

## 2023-08-26 MED ORDER — HYDROXYZINE HCL 25 MG PO TABS: 25.0000 mg | ORAL_TABLET | Freq: Four times a day (QID) | ORAL | Status: DC | PRN

## 2023-08-26 MED ORDER — SALINE SPRAY 0.65 % NA SOLN: 1.0000 | NASAL | Status: DC | PRN

## 2023-08-26 MED ORDER — SODIUM CHLORIDE 0.9 % IV SOLN: INTRAVENOUS | Status: DC

## 2023-08-26 MED ORDER — OXYCODONE HCL 5 MG PO TABS
15.0000 mg | ORAL_TABLET | Freq: Four times a day (QID) | ORAL | Status: DC | PRN
  Administered 2023-08-26 – 2023-08-28 (×4): 15 mg via ORAL
  Filled 2023-08-26 (×5): qty 3

## 2023-08-26 MED ORDER — LEVOTHYROXINE SODIUM 25 MCG PO TABS
137.0000 ug | ORAL_TABLET | Freq: Every day | ORAL | Status: DC
  Administered 2023-08-27 – 2023-08-28 (×2): 137 ug via ORAL
  Filled 2023-08-26 (×2): qty 1

## 2023-08-26 MED ORDER — INSULIN ASPART 100 UNIT/ML IJ SOLN
0.0000 [IU] | Freq: Every day | INTRAMUSCULAR | Status: DC
  Administered 2023-08-26 – 2023-08-27 (×2): 2 [IU] via SUBCUTANEOUS

## 2023-08-26 MED ORDER — SUGAMMADEX SODIUM 200 MG/2ML IV SOLN
INTRAVENOUS | Status: DC | PRN
  Administered 2023-08-26: 150 mg via INTRAVENOUS

## 2023-08-26 MED ORDER — FENTANYL CITRATE PF 50 MCG/ML IJ SOSY
PREFILLED_SYRINGE | INTRAMUSCULAR | Status: AC
  Filled 2023-08-26: qty 1

## 2023-08-26 MED ORDER — INSULIN ASPART 100 UNIT/ML IJ SOLN
0.0000 [IU] | Freq: Three times a day (TID) | INTRAMUSCULAR | Status: DC
  Administered 2023-08-26: 2 [IU] via SUBCUTANEOUS

## 2023-08-26 MED ORDER — LEVOTHYROXINE SODIUM 25 MCG PO TABS
125.0000 ug | ORAL_TABLET | Freq: Every day | ORAL | Status: DC
  Filled 2023-08-26: qty 1

## 2023-08-26 MED ORDER — OXYCODONE HCL ER 10 MG PO T12A
10.0000 mg | EXTENDED_RELEASE_TABLET | Freq: Two times a day (BID) | ORAL | Status: DC
  Administered 2023-08-26 – 2023-08-28 (×5): 10 mg via ORAL
  Filled 2023-08-26 (×5): qty 1

## 2023-08-26 MED ORDER — PROPOFOL 10 MG/ML IV BOLUS
INTRAVENOUS | Status: AC
  Filled 2023-08-26: qty 20

## 2023-08-26 MED ORDER — ROCURONIUM BROMIDE 100 MG/10ML IV SOLN
INTRAVENOUS | Status: DC | PRN
  Administered 2023-08-26: 50 mg via INTRAVENOUS

## 2023-08-26 MED ORDER — SODIUM CHLORIDE 0.9% IV SOLUTION
Freq: Once | INTRAVENOUS | Status: DC
Start: 2023-08-26 — End: 2023-08-26

## 2023-08-26 MED ORDER — ORAL CARE MOUTH RINSE: 15.0000 mL | Freq: Once | OROMUCOSAL | Status: AC

## 2023-08-26 MED ORDER — BISACODYL 10 MG RE SUPP: 10.0000 mg | Freq: Every day | RECTAL | Status: DC | PRN

## 2023-08-26 MED ORDER — INSULIN ASPART 100 UNIT/ML IJ SOLN
0.0000 [IU] | INTRAMUSCULAR | Status: DC | PRN
Start: 2023-08-26 — End: 2023-08-26

## 2023-08-26 MED ORDER — POLYETHYLENE GLYCOL 3350 17 G PO PACK: 17.0000 g | PACK | Freq: Every day | ORAL | Status: DC | PRN

## 2023-08-26 MED ORDER — SODIUM CHLORIDE 0.9 % IR SOLN
3000.0000 mL | Status: DC
  Administered 2023-08-26: 3000 mL

## 2023-08-26 MED ORDER — SENNA 8.6 MG PO TABS
2.0000 | ORAL_TABLET | Freq: Every day | ORAL | Status: DC
Start: 2023-08-26 — End: 2023-08-28
  Administered 2023-08-26 – 2023-08-27 (×2): 17.2 mg via ORAL
  Filled 2023-08-26 (×2): qty 2

## 2023-08-26 MED ORDER — CHLORHEXIDINE GLUCONATE 0.12 % MT SOLN
15.0000 mL | Freq: Once | OROMUCOSAL | Status: AC
  Administered 2023-08-26: 15 mL via OROMUCOSAL

## 2023-08-26 MED ORDER — LACTATED RINGERS IV SOLN: INTRAVENOUS | Status: DC

## 2023-08-26 MED ORDER — OXYBUTYNIN CHLORIDE 5 MG PO TABS
5.0000 mg | ORAL_TABLET | Freq: Once | ORAL | Status: AC
  Administered 2023-08-26: 5 mg via ORAL
  Filled 2023-08-26: qty 1

## 2023-08-26 MED ORDER — PROPOFOL 10 MG/ML IV BOLUS
INTRAVENOUS | Status: DC | PRN
  Administered 2023-08-26: 100 mg via INTRAVENOUS

## 2023-08-26 MED ORDER — HYDROMORPHONE HCL 1 MG/ML IJ SOLN
0.5000 mg | Freq: Once | INTRAMUSCULAR | Status: AC
  Administered 2023-08-26: 0.5 mg via INTRAVENOUS
  Filled 2023-08-26: qty 1

## 2023-08-26 MED ORDER — PANTOPRAZOLE SODIUM 40 MG PO TBEC
40.0000 mg | DELAYED_RELEASE_TABLET | Freq: Every day | ORAL | Status: DC
  Administered 2023-08-26 – 2023-08-28 (×3): 40 mg via ORAL
  Filled 2023-08-26 (×3): qty 1

## 2023-08-26 MED ORDER — SODIUM CHLORIDE 0.9 % IV SOLN: INTRAVENOUS | Status: AC

## 2023-08-26 MED ORDER — SODIUM CHLORIDE 0.9 % IR SOLN
Status: DC | PRN
  Administered 2023-08-26 (×4): 3000 mL via INTRAVESICAL

## 2023-08-26 MED ORDER — DEXAMETHASONE SODIUM PHOSPHATE 10 MG/ML IJ SOLN: INTRAMUSCULAR | Status: DC | PRN

## 2023-08-26 MED ORDER — LIDOCAINE HCL URETHRAL/MUCOSAL 2 % EX GEL
CUTANEOUS | Status: AC
Start: 2023-08-26 — End: ?
  Filled 2023-08-26: qty 30

## 2023-08-26 MED ORDER — FENTANYL CITRATE PF 50 MCG/ML IJ SOSY
25.0000 ug | PREFILLED_SYRINGE | INTRAMUSCULAR | Status: DC | PRN
  Administered 2023-08-26: 50 ug via INTRAVENOUS

## 2023-08-26 SURGICAL SUPPLY — 20 items
BAG URINE DRAIN 2000ML AR STRL (UROLOGICAL SUPPLIES) IMPLANT
BAG URO CATCHER STRL LF (MISCELLANEOUS) ×2 IMPLANT
CATH URETL OPEN 5X70 (CATHETERS) ×2 IMPLANT
CATH URO 16X24FR 3W FL PS (CATHETERS) IMPLANT
CLOTH BEACON ORANGE TIMEOUT ST (SAFETY) ×2 IMPLANT
DRAPE FOOT SWITCH (DRAPES) ×2 IMPLANT
ELECT REM PT RETURN 15FT ADLT (MISCELLANEOUS) IMPLANT
EVACUATOR MICROVAS BLADDER (UROLOGICAL SUPPLIES) IMPLANT
GLOVE SS BIOGEL STRL SZ 8 (GLOVE) ×2 IMPLANT
GOWN STRL REUS W/ TWL XL LVL3 (GOWN DISPOSABLE) ×2 IMPLANT
GUIDEWIRE STR DUAL SENSOR (WIRE) IMPLANT
KIT TURNOVER KIT A (KITS) IMPLANT
LOOP CUT BIPOLAR 24F LRG (ELECTROSURGICAL) IMPLANT
MANIFOLD NEPTUNE II (INSTRUMENTS) ×2 IMPLANT
NS IRRIG 1000ML POUR BTL (IV SOLUTION) IMPLANT
PACK CYSTO (CUSTOM PROCEDURE TRAY) ×2 IMPLANT
PAD PREP 24X48 CUFFED NSTRL (MISCELLANEOUS) ×2 IMPLANT
SET CYSTO W/LG BORE CLAMP LF (SET/KITS/TRAYS/PACK) IMPLANT
TUBING CONNECTING 10 (TUBING) ×2 IMPLANT
TUBING UROLOGY SET (TUBING) ×2 IMPLANT

## 2023-08-26 NOTE — Assessment & Plan Note (Signed)
-   CBI - Consult URology

## 2023-08-26 NOTE — Op Note (Signed)
OPERATIVE NOTE   Patient Name: Diana Kennedy  MRN: [id number]   Date of Procedure: 08/26/23   Preoperative diagnosis:  Gross hematuria Clot retention Anemia History of bladder cancer Right hydronephrosis AKI   Postoperative diagnosis:  Gross hematuria Clot retention Anemia Recurrent bladder cancer Right hydronephrosis, chronic in appearance AKI  Procedure:  Cystoscopy Bilateral retrograde pyelograms with intra-operative interpretation Clot evacuation Fulguration of bleeding Transurethral resection of bladder tumor (total resection size 3 cm)  Attending: Milderd Meager, MD  Anesthesia: General  Estimated blood loss: 100 ml of clot evacuated from bladder  Fluids: Per anesthesia record  Drains: 24 F 3 way foley to CBI  Specimens: blood clot, bladder tumor   Antibiotics: Rocephin IV q 24 hours (last dose given last night)  Findings: Large amount of clot in bladder; bleeding from prior bladder tumor resection site with apparent recurrent tumor at the inferior edge of tumor bed; small papillary lesion posterior to left UO; 3 papillary lesions on bladder dome; right retrograde with changes consistent with chronic dilation; normal left retrograde pyelogram  Indications:  67 year old female with history of gross hematuria for approximately 6 months.  She was diagnosed with high-grade bladder cancer in April 2024 during cystoscopy, clot evacuation, and transurethral resection of bladder tumor.  She was also treated for a right renal calculus in June 2024.  She had a right ureteral stent removed in the office on 08/01/2023.  She has had persistent gross hematuria over the past several months.  She presented to the emergency room yesterday with gross hematuria and difficulty voiding.  She was found to have urinary retention with placement of a Foley catheter.  She was also found to be severely anemic with a hemoglobin of 5.4.  She had a three-way Foley placed and was started  on continuous bladder irrigation.  CT imaging showed a right renal scarring, stable severe right hydronephrosis; fullness of the left collecting system without obvious hydronephrosis, and high density material within the urinary bladder.  She was continued on bladder irrigation overnight.  The catheter was obstructed earlier this morning requiring exchange of the catheter and irrigation of the bladder with return of significant clot.  She presents now for cystoscopy, clot evacuation, possible fulguration of bleeding, possible transurethral resection of bladder tumor, and bilateral retrograde pyelograms.  The procedure including potential risk was discussed with the patient in detail.  She understands wishes to proceed as described.  Description of Procedure:  The patient had received IV Rocephin last night.  She was brought to the operating room suite and properly identified.  After successful induction of a general anesthetic, the patient was placed in the dorsolithotomy position.  The patient's genital areas prepped and draped in sterile fashion.  The existing Foley catheter was removed prior to prepping.  A preoperative timeout was performed.  Using the 21 French rigid cystoscope, the urethra and bladder were examined.  A large amount of clot was noted in the bladder making visualization difficult.  A 26 French continuous-flow resectoscope sheath was then placed under direct visualization.  The clot was evacuated from the bladder through the resectoscope sheath.  A large amount of clot was obtained, approximately 100 mL in volume.  Inspection of the bladder revealed bleeding from the inferior edges of the prior bladder tumor resection site.  There also appeared to be some recurrent bladder tumor in this area that was bleeding.  There was some dystrophic calcifications noted on the superior aspect of the resection bed.  Also  noted was a small papillary tumor posterior to the left ureteral orifice and 3  papillary tumors on the bladder dome. The area of bleeding was fulgurated with the bipolar loop.  The abnormal appearing tissue on the inferior aspect of the resection bed was resected using the bipolar loop.  Hemostasis was obtained with cautery.  The dystrophic calcifications were also removed using the loop.  The small papillary lesions posterior to the left UO and on the bladder dome were completely resected with the bipolar loop.  Hemostasis was obtained in all those areas.  Specimens were sent to pathology.  At this point there was no evidence of bleeding.  There was no evidence of any bladder injury.  Bilateral retrograde pyelograms were performed for evaluation of the patient's upper tracts given her history of gross hematuria.  Scout film showed hardware in the spine.  A nonspecific bowel gas pattern was seen.  Using a 5 [place] open-ended catheter, contrast was checked into the right ureter.  The mid and proximal right ureter appeared dilated with chronic dilation noted in the right renal pelvis and calyces.  Contrast was injected into the left ureter in a similar fashion.  The left ureter and collecting system were normal in appearance.  The cystoscope was removed and a three-way Foley catheter was placed.  Slow CBI was started with return of clear irrigant.  The patient was then extubated and taken to the post anesthesia care unit in stable condition.  Complications: None  Condition: Stable, extubated, transferred to PACU  Plan:  Wean CBI as tolerated. Continue Foley catheter due to recent urinary retention Await pathology results.

## 2023-08-26 NOTE — ED Notes (Signed)
Labs scheduled for draw at 2040 and 2245 unable to be drawn at ordered times d/t pt receiving blood transfusion. Once timeframe completed post blood transfusion administration labs to be obtained at that time. Provider aware and okay with this.

## 2023-08-26 NOTE — Assessment & Plan Note (Signed)
Class 1, BMI 30.5

## 2023-08-26 NOTE — Assessment & Plan Note (Signed)
Not on aspirin, statin, BB

## 2023-08-26 NOTE — Anesthesia Preprocedure Evaluation (Addendum)
Anesthesia Evaluation  Patient identified by MRN, date of birth, ID band Patient awake    Reviewed: Allergy & Precautions, H&P , NPO status , Patient's Chart, lab work & pertinent test results  Airway Mallampati: II  TM Distance: >3 FB Neck ROM: Full    Dental no notable dental hx. (+) Edentulous Upper, Edentulous Lower, Dental Advisory Given   Pulmonary Patient abstained from smoking., former smoker   Pulmonary exam normal breath sounds clear to auscultation       Cardiovascular hypertension, + CAD, + CABG and + Peripheral Vascular Disease   Rhythm:Regular Rate:Normal     Neuro/Psych   Anxiety Depression    negative neurological ROS     GI/Hepatic Neg liver ROS,GERD  Medicated,,  Endo/Other  diabetesHypothyroidism    Renal/GU Renal disease  negative genitourinary   Musculoskeletal  (+)  Fibromyalgia -  Abdominal   Peds  Hematology  (+) Blood dyscrasia, anemia   Anesthesia Other Findings   Reproductive/Obstetrics negative OB ROS                             Anesthesia Physical Anesthesia Plan  ASA: 3  Anesthesia Plan: General   Post-op Pain Management: Ofirmev IV (intra-op)*   Induction: Intravenous  PONV Risk Score and Plan: 4 or greater and Ondansetron and Dexamethasone  Airway Management Planned: Oral ETT  Additional Equipment:   Intra-op Plan:   Post-operative Plan: Extubation in OR  Informed Consent: I have reviewed the patients History and Physical, chart, labs and discussed the procedure including the risks, benefits and alternatives for the proposed anesthesia with the patient or authorized representative who has indicated his/her understanding and acceptance.     Dental advisory given  Plan Discussed with: CRNA  Anesthesia Plan Comments:        Anesthesia Quick Evaluation

## 2023-08-26 NOTE — Assessment & Plan Note (Addendum)
Hgb 5.4 on admission due to acute blood loss from bladder cancer.  Transfused 2 units and Hgb up to 7.8 this afternoon.  Fulgurated in the ER 11/29 Basleine Hgb 9.5 g/dL - Trend Hgb - Check iron studies - CBI and foley per Urology - Continue empiric antibiotics for now - Follow culture data

## 2023-08-26 NOTE — Assessment & Plan Note (Signed)
TSH elevated - Continue levothyroxine, increase dose - Repeat TSH in 6-8 weeks

## 2023-08-26 NOTE — H&P (View-Only) (Signed)
Urology Inpatient Progress Note  Subjective: CBI continued overnight. Foley occluded with clot this AM.  Patient complaining of bladder pressure.  Nursing staff unable to irrigate foley.  Bladder scan showed > 300 ml in bladder.  Anti-infectives: Anti-infectives (From admission, onward)    Start     Dose/Rate Route Frequency Ordered Stop   08/25/23 2245  cefTRIAXone (ROCEPHIN) 1 g in sodium chloride 0.9 % 100 mL IVPB        1 g 200 mL/hr over 30 Minutes Intravenous Every 24 hours 08/25/23 2240         Current Facility-Administered Medications  Medication Dose Route Frequency Provider Last Rate Last Admin   0.9 %  sodium chloride infusion (Manually program via Guardrails IV Fluids)   Intravenous Once Therisa Doyne, MD   Held at 08/25/23 2107   0.9 %  sodium chloride infusion (Manually program via Guardrails IV Fluids)   Intravenous Once Therisa Doyne, MD   Held at 08/25/23 2108   0.9 %  sodium chloride infusion   Intravenous Continuous Therisa Doyne, MD 75 mL/hr at 08/26/23 0346 New Bag at 08/26/23 0346   acetaminophen (TYLENOL) tablet 650 mg  650 mg Oral Q6H PRN Therisa Doyne, MD       Or   acetaminophen (TYLENOL) suppository 650 mg  650 mg Rectal Q6H PRN Doutova, Anastassia, MD       bisacodyl (DULCOLAX) suppository 10 mg  10 mg Rectal Daily PRN Doutova, Anastassia, MD       cefTRIAXone (ROCEPHIN) 1 g in sodium chloride 0.9 % 100 mL IVPB  1 g Intravenous Q24H Eylin Pontarelli, Danford Bad., MD   Stopped at 08/25/23 2329   Chlorhexidine Gluconate Cloth 2 % PADS 6 each  6 each Topical Daily Doutova, Anastassia, MD       hydrOXYzine (ATARAX) tablet 25 mg  25 mg Oral Q6H PRN Doutova, Anastassia, MD       insulin aspart (novoLOG) injection 0-9 Units  0-9 Units Subcutaneous Q4H Therisa Doyne, MD   2 Units at 08/26/23 0341   levothyroxine (SYNTHROID) tablet 125 mcg  125 mcg Oral Q0600 Therisa Doyne, MD       oxyCODONE (Oxy IR/ROXICODONE) immediate release tablet 15  mg  15 mg Oral Q6H PRN Doutova, Anastassia, MD       oxyCODONE (OXYCONTIN) 12 hr tablet 10 mg  10 mg Oral Q12H Doutova, Anastassia, MD   10 mg at 08/26/23 0340   pantoprazole (PROTONIX) EC tablet 40 mg  40 mg Oral Daily Doutova, Anastassia, MD       polyethylene glycol (MIRALAX / GLYCOLAX) packet 17 g  17 g Oral Daily PRN Doutova, Anastassia, MD       pregabalin (LYRICA) capsule 200 mg  200 mg Oral TID Therisa Doyne, MD       senna (SENOKOT) tablet 17.2 mg  2 tablet Oral QHS Doutova, Anastassia, MD       sodium chloride (OCEAN) 0.65 % nasal spray 1 spray  1 spray Each Nare PRN Anthoney Harada, NP       sodium chloride irrigation 0.9 % 3,000 mL  3,000 mL Irrigation Continuous Doutova, Anastassia, MD   3,000 mL at 08/25/23 2107     Objective: Vital signs in last 24 hours: Temp:  [98 F (36.7 C)-98.6 F (37 C)] 98.1 F (36.7 C) (11/29 0345) Pulse Rate:  [69-118] 73 (11/29 0515) Resp:  [10-21] 11 (11/29 0515) BP: (85-155)/(45-123) 112/62 (11/29 0515) SpO2:  [95 %-100 %] 99 % (11/29 0515) Weight:  [  88.6 kg-90.7 kg] 88.6 kg (11/29 0132)  Intake/Output from previous day: 11/28 0701 - 11/29 0700 In: 10539.3 [Blood:389.8; IV Piggyback:1099.5] Out: 91478 [Urine:11900] Intake/Output this shift: No intake/output data recorded.  GENERAL APPEARANCE:  Well appearing, well developed, well nourished, NAD HEENT:  Atraumatic, normocephalic, oropharynx clear NECK:  Supple without lymphadenopathy or thyromegaly ABDOMEN:  Soft, non-tender, no masses EXTREMITIES:  Moves all extremities well, without clubbing, cyanosis, or edema NEUROLOGIC:  Alert and oriented x 3, CN II-XII grossly intact MENTAL STATUS:  appropriate BACK:  Non-tender to palpation, No CVAT SKIN:  Warm, dry, and intact GU:  foley with bloody urine, no clots in tubing  Lab Results:  Recent Labs    08/25/23 1620 08/26/23 0324  WBC 7.7 6.5  HGB 5.4* 7.1*  HCT 17.4* 23.4*  PLT 181 181   BMET Recent Labs     08/25/23 1620 08/26/23 0326  NA 132* 138  K 5.0 3.6  CL 93* 101  CO2 26 29  GLUCOSE 589* 187*  BUN 40* 32*  CREATININE 1.53* 1.14*  CALCIUM 9.0 8.5*   PT/INR Recent Labs    08/26/23 0324  LABPROT 13.7  INR 1.0   ABG No results for input(s): "PHART", "HCO3" in the last 72 hours.  Invalid input(s): "PCO2", "PO2"  Studies/Results: CT Angio Chest PE W and/or Wo Contrast  Result Date: 08/25/2023 CLINICAL DATA:  Hypoxia EXAM: CT ANGIOGRAPHY CHEST WITH CONTRAST TECHNIQUE: Multidetector CT imaging of the chest was performed using the standard protocol during bolus administration of intravenous contrast. Multiplanar CT image reconstructions and MIPs were obtained to evaluate the vascular anatomy. RADIATION DOSE REDUCTION: This exam was performed according to the departmental dose-optimization program which includes automated exposure control, adjustment of the mA and/or kV according to patient size and/or use of iterative reconstruction technique. CONTRAST:  75mL OMNIPAQUE IOHEXOL 350 MG/ML SOLN COMPARISON:  Chest x-ray 08/25/2023, CT chest 05/07/2023 FINDINGS: Cardiovascular: Satisfactory opacification of the pulmonary arteries to the segmental level. No evidence of pulmonary embolism. Moderate aortic atherosclerosis. No aneurysm or dissection. Post CABG changes. Coronary vascular calcification. Borderline to mild cardiomegaly. No pericardial effusion Mediastinum/Nodes: Midline trachea. No thyroid mass. No suspicious lymph nodes. Esophagus within normal limits. Lungs/Pleura: No pleural effusion or pneumothorax. Atelectasis or scarring in the lingula. Subsegmental atelectasis at both lung bases. Upper Abdomen: No acute findings. Musculoskeletal: Post sternotomy changes. No acute osseous abnormality. Review of the MIP images confirms the above findings. IMPRESSION: 1. Negative for acute pulmonary embolus or aortic dissection. 2. Borderline to mild cardiomegaly. Post CABG changes. 3. Low lung  volumes with subsegmental atelectasis at the lung bases. 4. Aortic atherosclerosis. Aortic Atherosclerosis (ICD10-I70.0). Electronically Signed   By: Jasmine Pang M.D.   On: 08/25/2023 18:50   CT ABDOMEN PELVIS W CONTRAST  Result Date: 08/25/2023 CLINICAL DATA:  UTI, recurrent/complicated (Female). History of bladder cancer and nephrolithiasis. EXAM: CT ABDOMEN AND PELVIS WITH CONTRAST TECHNIQUE: Multidetector CT imaging of the abdomen and pelvis was performed using the standard protocol following bolus administration of intravenous contrast. RADIATION DOSE REDUCTION: This exam was performed according to the departmental dose-optimization program which includes automated exposure control, adjustment of the mA and/or kV according to patient size and/or use of iterative reconstruction technique. CONTRAST:  75mL OMNIPAQUE IOHEXOL 350 MG/ML SOLN COMPARISON:  CT chest abdomen pelvis 05/07/2023, CT abdomen pelvis 03/07/2023 FINDINGS: Lower chest: Please see separately dictated CT angiography chest 08/25/2023. Hepatobiliary: The hepatic parenchyma is diffusely hypodense compared to the splenic parenchyma consistent with fatty infiltration. No focal  liver abnormality. No gallstones, gallbladder wall thickening, or pericholecystic fluid. No biliary dilatation. Pancreas: No focal lesion. Normal pancreatic contour. No surrounding inflammatory changes. No main pancreatic ductal dilatation. Spleen: Normal in size without focal abnormality. Adrenals/Urinary Tract: No adrenal nodule bilaterally. Interval removal right ureteral stent. Bilateral kidneys enhance symmetrically. Right renal scarring. Similar-appearing severe right hydroureteronephrosis. The mid to distal right uterus normal in caliber. Fullness of the left collecting system with no frank hydronephrosis. No left hydroureter. No nephroureterolithiasis bilaterally. High density material within the urinary bladder lumen. Foley catheter tip and balloon within the  urinary bladder lumen. No urinary bladder wall thickening. Posterior right urinary bladder diverticula (4:312). On delayed imaging, there is no urothelial wall thickening and there are no filling defects in the opacified portions of the left collecting system or ureter. Filling defect of the right renal pelvis and proximal hydroureter on delayed imaging. Stomach/Bowel: Stomach is within normal limits. No evidence of bowel wall thickening or dilatation. Stool throughout the colon. Appendix appears normal. Vascular/Lymphatic: Infrarenal abdominal aorta measures up to 3 cm. No iliac aneurysm. Severe atherosclerotic plaque of the aorta and its branches. No abdominal, pelvic, or inguinal lymphadenopathy. Reproductive: Status post hysterectomy. No adnexal masses. Other: No intraperitoneal free fluid. No intraperitoneal free gas. No organized fluid collection. Musculoskeletal: No abdominal wall hernia or abnormality. No suspicious lytic or blastic osseous lesions. No acute displaced fracture. L4-L5 posterolateral interbody surgical hardware fusion. IMPRESSION: 1. Please see separately dictated CT angiography chest 08/25/2023. 2. High density material within the urinary bladder lumen - finding may represent blood products versus less likely mass. Associated similar-appearing severe right hydronephrosis and proximal right hydroureter with associated filling defect of the right renal pelvis and proximal hydroureter on delayed imaging - question underlying blood products versus mass. No nephroureterolithiasis. Interval removal of a right ureteral stent. Recommend urologic consultation. 3. 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 Recommend follow-up ultrasound every 3 years. (Ref.: J Vasc Surg. 2018; 67:2-77 and J Am Coll Radiol 2013;10(10):789-794.). Aortic aneurysm NOS (ICD10-I71.9). 4.  Aortic Atherosclerosis (ICD10-I70.0)-severe. Electronically Signed   By: Tish Frederickson M.D.   On: 08/25/2023 18:48   DG  Chest Portable 1 View  Result Date: 08/25/2023 CLINICAL DATA:  Hypoxia. EXAM: PORTABLE CHEST 1 VIEW COMPARISON:  05/07/2023. FINDINGS: Cardiac silhouette is normal in size. No mediastinal or hilar masses. There is hazy opacity at the left lung base. Mild opacities noted in medial right lung base. Remainder of the lungs is clear. No pleural effusion or pneumothorax. Right anterior chest wall Port-A-Cath is stable. Skeletal structures are grossly intact. IMPRESSION: Bibasilar opacities, left greater than right, which may be due to atelectasis, pneumonia or a combination. No pulmonary edema. Electronically Signed   By: Amie Portland M.D.   On: 08/25/2023 16:03     Assessment & Plan: Gross hematuria with clot retention Anemia History of bladder cancer Right hydronephrosis - appears chronic AKI   Procedure: 22 F 3 way foley removed.  Under sterile conditions, a 79F 3 way hematuria catheter placed.  Immediate return of pink urine.  Foley irrigated with 1 L NS with return of large amount of clot material.  CBI restarted with light pink urine noted.  Patient tolerated well.  Given ongoing gross hematuria and clot retention, recommend surgical management with cystoscopy, bilateral retrograde pyelograms, clot evacuation, fulguration of bleeding, possible TURBT.  Procedure and risk discussed with patient.  She understands and wishes to proceed as described.  Procedure: The patient will be scheduled for cystoscopy, bilateral  retrograde pyelograms, clot evacuation, fulguration of bleeding at [facility].  Surgical request is placed with the surgery schedulers and will be scheduled at the patient's/family request. Informed consent is given as documented below. Anesthesia: General  The patient does not have sleep apnea, history of MRSA, history of VRE, history of cardiac device requiring special anesthetic needs. Patient is stable and considered clear for surgical in an outpatient ambulatory surgery setting  as well as patient hospital setting.  Consent for Operation or Procedure: Provider Certification I hereby certify that the nature, purpose, benefits, usual and most frequent risks of, and alternatives to, the operation or procedure have been explained to the patient (or person authorized to sign for the patient) either by me as responsible physician or by the provider who is to perform the operation or procedure. Time spent such that the patient/family has had an opportunity to ask questions, and that those questions have been answered. The patient or the patient's representative has been advised that selected tasks may be performed by assistants to the primary health care provider(s). I believe that the patient (or person authorized to sign for the patient) understands what has been explained, and has consented to the operation or procedure. No guarantees were implied or made.   Di Kindle, MD 08/26/2023

## 2023-08-26 NOTE — Assessment & Plan Note (Signed)
In remission.

## 2023-08-26 NOTE — Assessment & Plan Note (Signed)
-   Follow surgical pathology from 11/29

## 2023-08-26 NOTE — Assessment & Plan Note (Addendum)
-   Continue opiates at home dose (oxycodone ER and PRN oxycodone 15) - Continue Lyrica

## 2023-08-26 NOTE — Assessment & Plan Note (Signed)
Repeat ECG.

## 2023-08-26 NOTE — Assessment & Plan Note (Addendum)
Cr 1.5 on admission improved to baseline with blood transfusion

## 2023-08-26 NOTE — Plan of Care (Signed)
  Problem: Coping: Goal: Ability to adjust to condition or change in health will improve Outcome: Progressing   Problem: Fluid Volume: Goal: Ability to maintain a balanced intake and output will improve Outcome: Progressing   Problem: Nutritional: Goal: Maintenance of adequate nutrition will improve Outcome: Progressing   Problem: Skin Integrity: Goal: Risk for impaired skin integrity will decrease Outcome: Progressing   Problem: Clinical Measurements: Goal: Ability to maintain clinical measurements within normal limits will improve Outcome: Progressing Goal: Will remain free from infection Outcome: Progressing Goal: Cardiovascular complication will be avoided Outcome: Progressing   Problem: Nutrition: Goal: Adequate nutrition will be maintained Outcome: Progressing   Problem: Elimination: Goal: Will not experience complications related to urinary retention Outcome: Progressing   Problem: Pain Management: Goal: General experience of comfort will improve Outcome: Progressing

## 2023-08-26 NOTE — Anesthesia Procedure Notes (Addendum)
Procedure Name: Intubation Date/Time: 08/26/2023 11:42 AM  Performed by: Nathen May, CRNAPre-anesthesia Checklist: Patient identified, Emergency Drugs available, Suction available and Patient being monitored Patient Re-evaluated:Patient Re-evaluated prior to induction Oxygen Delivery Method: Circle System Utilized Preoxygenation: Pre-oxygenation with 100% oxygen Induction Type: IV induction Ventilation: Mask ventilation without difficulty Laryngoscope Size: Mac and 3 Tube type: Oral Tube size: 7.0 mm Number of attempts: 1 Airway Equipment and Method: Stylet and Oral airway Placement Confirmation: ETT inserted through vocal cords under direct vision, positive ETCO2 and breath sounds checked- equal and bilateral Secured at: 21 cm Tube secured with: Tape Dental Injury: Teeth and Oropharynx as per pre-operative assessment

## 2023-08-26 NOTE — Assessment & Plan Note (Signed)
GLucoses elevated Not on meds at home - Continue SS corrections

## 2023-08-26 NOTE — Progress Notes (Signed)
  Progress Note   Patient: Diana Kennedy MVH:[id number] DOB: 1956/08/22 DOA: 08/25/2023     0 DOS: the patient was seen and examined on 08/26/2023       Brief hospital course: 67 y.o. F with obesity, DM, chronic pain on Xtampza, CAD, hypothyroidism, endometrial CA s/p chemo/rad and now urothelial carcinoma of the bladder who presented with acute inability to urinate and gross hematuria.  In the ER, Hgb 5.4; CT showed distended bladder, recurrent right hydronephrosis and debris in the bladder.  Transfused 2 units.  Urology consulted, started on CBI and admitted to hospitalist service.     Assessment and Plan: * Symptomatic anemia Hgb 5.4 on admission due to acute blood loss from bladder cancer.  Transfused 2 units and Hgb up to 7.8 this afternoon.  Fulgurated in the ER 11/29 Basleine Hgb 9.5 g/dL - Trend Hgb - Check iron studies - CBI and foley per Urology - Continue empiric antibiotics for now - Follow culture data    Bladder outlet obstruction - CBI - Consult URology  Urothelial carcinoma of the bladder [facility]) - Follow surgical pathology from 11/29  Chronic pain syndrome - Continue opiates at home dose (oxycodone ER and PRN oxycodone 15) - Continue Lyrica  AKI (acute kidney injury) (HCC) Cr 1.5 on admission improved to baseline with blood transfusion  QT prolongation - Repeat ECG  Endometrial cancer (HCC) In remission  Hydronephrosis of right kidney    Obesity (BMI 30-39.9) Class 1, BMI 30.5  Acquired hypothyroidism TSH elevated - Continue levothyroxine, increase dose - Repeat TSH in 6-8 weeks  Uncontrolled type 2 diabetes mellitus with hyperglycemia, without long-term current use of insulin (HCC) GLucoses controlled - Continue SS corrections  Subclavian artery stenosis, right (HCC)    Coronary artery disease involving native coronary artery Not on aspirin, statin, BB          Subjective: Patient is feeling a lot better after surgical  evacuation of the bladder.  She has had no fever, confusion.  She has some moderate right lower quadrant/suprapubic pain.     Physical Exam: BP (!) 119/51   Pulse 91   Temp (!) 97.5 F (36.4 C) (Oral)   Resp 15   Ht 5\' 7"  (1.702 m)   Wt 88.6 kg   SpO2 100%   BMI 30.59 kg/m   Adult female, lying in bed, interactive and appropriate, no acute distress RRR, no murmurs, no peripheral edema Respiratory rate normal, lungs clear without rales or wheezes Abdomen with some guarding diffusely, worst pain in the right lower quadrant, tenderness to light palpation, no distention Attention normal, affect appropriate, judgment and insight appear normal    Data Reviewed: Basic metabolic panel shows creatinine down to 1.1 CT angiogram of the chest unremarkable CT abdomen and pelvis shows right hydronephrosis, bladder hemorrhage Hemoglobin 5.4 up to 7.1 up to 7.8    Family Communication: None present    Disposition: Status is: Inpatient         Author: Alberteen Sam, MD 08/26/2023 4:11 PM  For on call review [email or web address].

## 2023-08-26 NOTE — Plan of Care (Signed)
Discussed with patient plan of care for the evening, pain management and admission questions with some teach back displayed.  What is important to the patient is rest and continuous bladder irrigation,  Problem: Education: Goal: Ability to describe self-care measures that may prevent or decrease complications (Diabetes Survival Skills Education) will improve Outcome: Progressing   Problem: Coping: Goal: Ability to adjust to condition or change in health will improve Outcome: Progressing

## 2023-08-26 NOTE — Anesthesia Postprocedure Evaluation (Signed)
Anesthesia Post Note  Patient: Diana Kennedy  Procedure(s) Performed: CYSTOSCOPY WITH RETROGRADE PYELOGRAM (Bilateral) TRANSURETHRAL RESECTION OF BLADDER TUMOR (TURBT) CYSTOSCOPY WITH FULGERATION     Patient location during evaluation: PACU Anesthesia Type: General Level of consciousness: awake and alert Pain management: pain level controlled Vital Signs Assessment: post-procedure vital signs reviewed and stable Respiratory status: spontaneous breathing, nonlabored ventilation, respiratory function stable and patient connected to nasal cannula oxygen Cardiovascular status: blood pressure returned to baseline and stable Postop Assessment: no apparent nausea or vomiting Anesthetic complications: no  No notable events documented.  Last Vitals:  Vitals:   08/26/23 1315 08/26/23 1317  BP: 128/77 128/77  Pulse: 88 86  Resp: 12 12  Temp:    SpO2: 92% 93%    Last Pain:  Vitals:   08/26/23 1317  TempSrc:   PainSc: 3                  Camil Hausmann,W. EDMOND

## 2023-08-26 NOTE — Progress Notes (Signed)
Urology Inpatient Progress Note  Subjective: CBI continued overnight. Foley occluded with clot this AM.  Patient complaining of bladder pressure.  Nursing staff unable to irrigate foley.  Bladder scan showed > 300 ml in bladder.  Anti-infectives: Anti-infectives (From admission, onward)    Start     Dose/Rate Route Frequency Ordered Stop   08/25/23 2245  cefTRIAXone (ROCEPHIN) 1 g in sodium chloride 0.9 % 100 mL IVPB        1 g 200 mL/hr over 30 Minutes Intravenous Every 24 hours 08/25/23 2240         Current Facility-Administered Medications  Medication Dose Route Frequency Provider Last Rate Last Admin   0.9 %  sodium chloride infusion (Manually program via Guardrails IV Fluids)   Intravenous Once Therisa Doyne, MD   Held at 08/25/23 2107   0.9 %  sodium chloride infusion (Manually program via Guardrails IV Fluids)   Intravenous Once Therisa Doyne, MD   Held at 08/25/23 2108   0.9 %  sodium chloride infusion   Intravenous Continuous Therisa Doyne, MD 75 mL/hr at 08/26/23 0346 New Bag at 08/26/23 0346   acetaminophen (TYLENOL) tablet 650 mg  650 mg Oral Q6H PRN Therisa Doyne, MD       Or   acetaminophen (TYLENOL) suppository 650 mg  650 mg Rectal Q6H PRN Doutova, Anastassia, MD       bisacodyl (DULCOLAX) suppository 10 mg  10 mg Rectal Daily PRN Doutova, Anastassia, MD       cefTRIAXone (ROCEPHIN) 1 g in sodium chloride 0.9 % 100 mL IVPB  1 g Intravenous Q24H Eylin Pontarelli, Danford Bad., MD   Stopped at 08/25/23 2329   Chlorhexidine Gluconate Cloth 2 % PADS 6 each  6 each Topical Daily Doutova, Anastassia, MD       hydrOXYzine (ATARAX) tablet 25 mg  25 mg Oral Q6H PRN Doutova, Anastassia, MD       insulin aspart (novoLOG) injection 0-9 Units  0-9 Units Subcutaneous Q4H Therisa Doyne, MD   2 Units at 08/26/23 0341   levothyroxine (SYNTHROID) tablet 125 mcg  125 mcg Oral Q0600 Therisa Doyne, MD       oxyCODONE (Oxy IR/ROXICODONE) immediate release tablet 15  mg  15 mg Oral Q6H PRN Doutova, Anastassia, MD       oxyCODONE (OXYCONTIN) 12 hr tablet 10 mg  10 mg Oral Q12H Doutova, Anastassia, MD   10 mg at 08/26/23 0340   pantoprazole (PROTONIX) EC tablet 40 mg  40 mg Oral Daily Doutova, Anastassia, MD       polyethylene glycol (MIRALAX / GLYCOLAX) packet 17 g  17 g Oral Daily PRN Doutova, Anastassia, MD       pregabalin (LYRICA) capsule 200 mg  200 mg Oral TID Therisa Doyne, MD       senna (SENOKOT) tablet 17.2 mg  2 tablet Oral QHS Doutova, Anastassia, MD       sodium chloride (OCEAN) 0.65 % nasal spray 1 spray  1 spray Each Nare PRN Anthoney Harada, NP       sodium chloride irrigation 0.9 % 3,000 mL  3,000 mL Irrigation Continuous Doutova, Anastassia, MD   3,000 mL at 08/25/23 2107     Objective: Vital signs in last 24 hours: Temp:  [98 F (36.7 C)-98.6 F (37 C)] 98.1 F (36.7 C) (11/29 0345) Pulse Rate:  [69-118] 73 (11/29 0515) Resp:  [10-21] 11 (11/29 0515) BP: (85-155)/(45-123) 112/62 (11/29 0515) SpO2:  [95 %-100 %] 99 % (11/29 0515) Weight:  [  88.6 kg-90.7 kg] 88.6 kg (11/29 0132)  Intake/Output from previous day: 11/28 0701 - 11/29 0700 In: 10539.3 [Blood:389.8; IV Piggyback:1099.5] Out: 91478 [Urine:11900] Intake/Output this shift: No intake/output data recorded.  GENERAL APPEARANCE:  Well appearing, well developed, well nourished, NAD HEENT:  Atraumatic, normocephalic, oropharynx clear NECK:  Supple without lymphadenopathy or thyromegaly ABDOMEN:  Soft, non-tender, no masses EXTREMITIES:  Moves all extremities well, without clubbing, cyanosis, or edema NEUROLOGIC:  Alert and oriented x 3, CN II-XII grossly intact MENTAL STATUS:  appropriate BACK:  Non-tender to palpation, No CVAT SKIN:  Warm, dry, and intact GU:  foley with bloody urine, no clots in tubing  Lab Results:  Recent Labs    08/25/23 1620 08/26/23 0324  WBC 7.7 6.5  HGB 5.4* 7.1*  HCT 17.4* 23.4*  PLT 181 181   BMET Recent Labs     08/25/23 1620 08/26/23 0326  NA 132* 138  K 5.0 3.6  CL 93* 101  CO2 26 29  GLUCOSE 589* 187*  BUN 40* 32*  CREATININE 1.53* 1.14*  CALCIUM 9.0 8.5*   PT/INR Recent Labs    08/26/23 0324  LABPROT 13.7  INR 1.0   ABG No results for input(s): "PHART", "HCO3" in the last 72 hours.  Invalid input(s): "PCO2", "PO2"  Studies/Results: CT Angio Chest PE W and/or Wo Contrast  Result Date: 08/25/2023 CLINICAL DATA:  Hypoxia EXAM: CT ANGIOGRAPHY CHEST WITH CONTRAST TECHNIQUE: Multidetector CT imaging of the chest was performed using the standard protocol during bolus administration of intravenous contrast. Multiplanar CT image reconstructions and MIPs were obtained to evaluate the vascular anatomy. RADIATION DOSE REDUCTION: This exam was performed according to the departmental dose-optimization program which includes automated exposure control, adjustment of the mA and/or kV according to patient size and/or use of iterative reconstruction technique. CONTRAST:  75mL OMNIPAQUE IOHEXOL 350 MG/ML SOLN COMPARISON:  Chest x-ray 08/25/2023, CT chest 05/07/2023 FINDINGS: Cardiovascular: Satisfactory opacification of the pulmonary arteries to the segmental level. No evidence of pulmonary embolism. Moderate aortic atherosclerosis. No aneurysm or dissection. Post CABG changes. Coronary vascular calcification. Borderline to mild cardiomegaly. No pericardial effusion Mediastinum/Nodes: Midline trachea. No thyroid mass. No suspicious lymph nodes. Esophagus within normal limits. Lungs/Pleura: No pleural effusion or pneumothorax. Atelectasis or scarring in the lingula. Subsegmental atelectasis at both lung bases. Upper Abdomen: No acute findings. Musculoskeletal: Post sternotomy changes. No acute osseous abnormality. Review of the MIP images confirms the above findings. IMPRESSION: 1. Negative for acute pulmonary embolus or aortic dissection. 2. Borderline to mild cardiomegaly. Post CABG changes. 3. Low lung  volumes with subsegmental atelectasis at the lung bases. 4. Aortic atherosclerosis. Aortic Atherosclerosis (ICD10-I70.0). Electronically Signed   By: Jasmine Pang M.D.   On: 08/25/2023 18:50   CT ABDOMEN PELVIS W CONTRAST  Result Date: 08/25/2023 CLINICAL DATA:  UTI, recurrent/complicated (Female). History of bladder cancer and nephrolithiasis. EXAM: CT ABDOMEN AND PELVIS WITH CONTRAST TECHNIQUE: Multidetector CT imaging of the abdomen and pelvis was performed using the standard protocol following bolus administration of intravenous contrast. RADIATION DOSE REDUCTION: This exam was performed according to the departmental dose-optimization program which includes automated exposure control, adjustment of the mA and/or kV according to patient size and/or use of iterative reconstruction technique. CONTRAST:  75mL OMNIPAQUE IOHEXOL 350 MG/ML SOLN COMPARISON:  CT chest abdomen pelvis 05/07/2023, CT abdomen pelvis 03/07/2023 FINDINGS: Lower chest: Please see separately dictated CT angiography chest 08/25/2023. Hepatobiliary: The hepatic parenchyma is diffusely hypodense compared to the splenic parenchyma consistent with fatty infiltration. No focal  liver abnormality. No gallstones, gallbladder wall thickening, or pericholecystic fluid. No biliary dilatation. Pancreas: No focal lesion. Normal pancreatic contour. No surrounding inflammatory changes. No main pancreatic ductal dilatation. Spleen: Normal in size without focal abnormality. Adrenals/Urinary Tract: No adrenal nodule bilaterally. Interval removal right ureteral stent. Bilateral kidneys enhance symmetrically. Right renal scarring. Similar-appearing severe right hydroureteronephrosis. The mid to distal right uterus normal in caliber. Fullness of the left collecting system with no frank hydronephrosis. No left hydroureter. No nephroureterolithiasis bilaterally. High density material within the urinary bladder lumen. Foley catheter tip and balloon within the  urinary bladder lumen. No urinary bladder wall thickening. Posterior right urinary bladder diverticula (4:312). On delayed imaging, there is no urothelial wall thickening and there are no filling defects in the opacified portions of the left collecting system or ureter. Filling defect of the right renal pelvis and proximal hydroureter on delayed imaging. Stomach/Bowel: Stomach is within normal limits. No evidence of bowel wall thickening or dilatation. Stool throughout the colon. Appendix appears normal. Vascular/Lymphatic: Infrarenal abdominal aorta measures up to 3 cm. No iliac aneurysm. Severe atherosclerotic plaque of the aorta and its branches. No abdominal, pelvic, or inguinal lymphadenopathy. Reproductive: Status post hysterectomy. No adnexal masses. Other: No intraperitoneal free fluid. No intraperitoneal free gas. No organized fluid collection. Musculoskeletal: No abdominal wall hernia or abnormality. No suspicious lytic or blastic osseous lesions. No acute displaced fracture. L4-L5 posterolateral interbody surgical hardware fusion. IMPRESSION: 1. Please see separately dictated CT angiography chest 08/25/2023. 2. High density material within the urinary bladder lumen - finding may represent blood products versus less likely mass. Associated similar-appearing severe right hydronephrosis and proximal right hydroureter with associated filling defect of the right renal pelvis and proximal hydroureter on delayed imaging - question underlying blood products versus mass. No nephroureterolithiasis. Interval removal of a right ureteral stent. Recommend urologic consultation. 3. 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 Recommend follow-up ultrasound every 3 years. (Ref.: J Vasc Surg. 2018; 67:2-77 and J Am Coll Radiol 2013;10(10):789-794.). Aortic aneurysm NOS (ICD10-I71.9). 4.  Aortic Atherosclerosis (ICD10-I70.0)-severe. Electronically Signed   By: Tish Frederickson M.D.   On: 08/25/2023 18:48   DG  Chest Portable 1 View  Result Date: 08/25/2023 CLINICAL DATA:  Hypoxia. EXAM: PORTABLE CHEST 1 VIEW COMPARISON:  05/07/2023. FINDINGS: Cardiac silhouette is normal in size. No mediastinal or hilar masses. There is hazy opacity at the left lung base. Mild opacities noted in medial right lung base. Remainder of the lungs is clear. No pleural effusion or pneumothorax. Right anterior chest wall Port-A-Cath is stable. Skeletal structures are grossly intact. IMPRESSION: Bibasilar opacities, left greater than right, which may be due to atelectasis, pneumonia or a combination. No pulmonary edema. Electronically Signed   By: Amie Portland M.D.   On: 08/25/2023 16:03     Assessment & Plan: Gross hematuria with clot retention Anemia History of bladder cancer Right hydronephrosis - appears chronic AKI   Procedure: 22 F 3 way foley removed.  Under sterile conditions, a 79F 3 way hematuria catheter placed.  Immediate return of pink urine.  Foley irrigated with 1 L NS with return of large amount of clot material.  CBI restarted with light pink urine noted.  Patient tolerated well.  Given ongoing gross hematuria and clot retention, recommend surgical management with cystoscopy, bilateral retrograde pyelograms, clot evacuation, fulguration of bleeding, possible TURBT.  Procedure and risk discussed with patient.  She understands and wishes to proceed as described.  Procedure: The patient will be scheduled for cystoscopy, bilateral  retrograde pyelograms, clot evacuation, fulguration of bleeding at [facility].  Surgical request is placed with the surgery schedulers and will be scheduled at the patient's/family request. Informed consent is given as documented below. Anesthesia: General  The patient does not have sleep apnea, history of MRSA, history of VRE, history of cardiac device requiring special anesthetic needs. Patient is stable and considered clear for surgical in an outpatient ambulatory surgery setting  as well as patient hospital setting.  Consent for Operation or Procedure: Provider Certification I hereby certify that the nature, purpose, benefits, usual and most frequent risks of, and alternatives to, the operation or procedure have been explained to the patient (or person authorized to sign for the patient) either by me as responsible physician or by the provider who is to perform the operation or procedure. Time spent such that the patient/family has had an opportunity to ask questions, and that those questions have been answered. The patient or the patient's representative has been advised that selected tasks may be performed by assistants to the primary health care provider(s). I believe that the patient (or person authorized to sign for the patient) understands what has been explained, and has consented to the operation or procedure. No guarantees were implied or made.   Di Kindle, MD 08/26/2023

## 2023-08-26 NOTE — Transfer of Care (Signed)
Immediate Anesthesia Transfer of Care Note  Patient: Diana Kennedy  Procedure(s) Performed: CYSTOSCOPY WITH RETROGRADE PYELOGRAM (Bilateral) TRANSURETHRAL RESECTION OF BLADDER TUMOR (TURBT) CYSTOSCOPY WITH FULGERATION  Patient Location: PACU  Anesthesia Type:General  Level of Consciousness: awake, alert , and oriented  Airway & Oxygen Therapy: Patient Spontanous Breathing and Patient connected to nasal cannula oxygen  Post-op Assessment: Report given to RN and Post -op Vital signs reviewed and stable  Post vital signs: Reviewed and stable  Last Vitals:  Vitals Value Taken Time  BP 138/79   Temp    Pulse 64   Resp 18   SpO2 93     Last Pain:  Vitals:   08/26/23 1114  TempSrc:   PainSc: 0-No pain      Patients Stated Pain Goal: 3 (08/26/23 1114)  Complications: No notable events documented.

## 2023-08-26 NOTE — Interval H&P Note (Signed)
History and Physical Interval Note:  08/26/2023 11:06 AM  Naomie Dean  has presented today for surgery, with the diagnosis of GROSS HEMATURIA,CLOTS.  The various methods of treatment have been discussed with the patient and family. After consideration of risks, benefits and other options for treatment, the patient has consented to  Procedure(s): CYSTOSCOPY WITH RETROGRADE PYELOGRAM (Bilateral) TRANSURETHRAL RESECTION OF BLADDER TUMOR (TURBT) (N/A) CYSTOSCOPY WITH FULGERATION (N/A) as a surgical intervention.  The patient's history has been reviewed, patient examined, no change in status, stable for surgery.  I have reviewed the patient's chart and labs.  Questions were answered to the patient's satisfaction.     Di Kindle

## 2023-08-26 NOTE — Hospital Course (Addendum)
67 y.o. F with obesity, DM, chronic pain on Xtampza, CAD, hypothyroidism, endometrial CA s/p chemo/rad and now urothelial carcinoma of the bladder who presented with acute inability to urinate and gross hematuria.  In the ER, Hgb 5.4; CT showed distended bladder, recurrent right hydronephrosis and debris in the bladder.  Transfused 2 units.  Urology consulted, started on CBI and admitted to hospitalist service.

## 2023-08-27 DIAGNOSIS — D62 Acute posthemorrhagic anemia: Secondary | ICD-10-CM | POA: Diagnosis not present

## 2023-08-27 DIAGNOSIS — N179 Acute kidney failure, unspecified: Secondary | ICD-10-CM | POA: Diagnosis not present

## 2023-08-27 DIAGNOSIS — R310 Gross hematuria: Secondary | ICD-10-CM

## 2023-08-27 DIAGNOSIS — R338 Other retention of urine: Secondary | ICD-10-CM | POA: Diagnosis not present

## 2023-08-27 LAB — COMPREHENSIVE METABOLIC PANEL
ALT: 22 U/L (ref 0–44)
AST: 50 U/L — ABNORMAL HIGH (ref 15–41)
Albumin: 2.8 g/dL — ABNORMAL LOW (ref 3.5–5.0)
Alkaline Phosphatase: 88 U/L (ref 38–126)
Anion gap: 9 (ref 5–15)
BUN: 24 mg/dL — ABNORMAL HIGH (ref 8–23)
CO2: 29 mmol/L (ref 22–32)
Calcium: 8.3 mg/dL — ABNORMAL LOW (ref 8.9–10.3)
Chloride: 99 mmol/L (ref 98–111)
Creatinine, Ser: 1.16 mg/dL — ABNORMAL HIGH (ref 0.44–1.00)
GFR, Estimated: 52 mL/min — ABNORMAL LOW (ref >60)
Glucose, Bld: 317 mg/dL — ABNORMAL HIGH (ref 70–99)
Potassium: 4.4 mmol/L (ref 3.5–5.1)
Sodium: 137 mmol/L (ref 135–145)
Total Bilirubin: 0.4 mg/dL (ref <1.2)
Total Protein: 6.3 g/dL — ABNORMAL LOW (ref 6.5–8.1)

## 2023-08-27 LAB — CBC
HCT: 23.2 % — ABNORMAL LOW (ref 36.0–46.0)
Hemoglobin: 7.1 g/dL — ABNORMAL LOW (ref 12.0–15.0)
MCH: 29 pg (ref 26.0–34.0)
MCHC: 30.6 g/dL (ref 30.0–36.0)
MCV: 94.7 fL (ref 80.0–100.0)
Platelets: 139 10*3/uL — ABNORMAL LOW (ref 150–400)
RBC: 2.45 MIL/uL — ABNORMAL LOW (ref 3.87–5.11)
RDW: 21 % — ABNORMAL HIGH (ref 11.5–15.5)
WBC: 5.8 10*3/uL (ref 4.0–10.5)
nRBC: 0.3 % — ABNORMAL HIGH (ref 0.0–0.2)

## 2023-08-27 LAB — URINE CULTURE: Culture: NO GROWTH

## 2023-08-27 LAB — IRON AND TIBC
Iron: 30 ug/dL (ref 28–170)
Saturation Ratios: 8 % — ABNORMAL LOW (ref 10.4–31.8)
TIBC: 392 ug/dL (ref 250–450)
UIBC: 362 ug/dL

## 2023-08-27 LAB — T4, FREE: Free T4: 0.76 ng/dL (ref 0.61–1.12)

## 2023-08-27 LAB — GLUCOSE, CAPILLARY
Glucose-Capillary: 289 mg/dL — ABNORMAL HIGH (ref 70–99)
Glucose-Capillary: 268 mg/dL — ABNORMAL HIGH (ref 70–99)
Glucose-Capillary: 240 mg/dL — ABNORMAL HIGH (ref 70–99)
Glucose-Capillary: 243 mg/dL — ABNORMAL HIGH (ref 70–99)

## 2023-08-27 LAB — FERRITIN: Ferritin: 23 ng/mL (ref 11–307)

## 2023-08-27 MED ORDER — INSULIN ASPART 100 UNIT/ML IJ SOLN: 0.0000 [IU] | Freq: Three times a day (TID) | INTRAMUSCULAR | Status: DC

## 2023-08-27 MED ORDER — INSULIN GLARGINE-YFGN 100 UNIT/ML ~~LOC~~ SOLN
10.0000 [IU] | Freq: Every day | SUBCUTANEOUS | Status: DC
  Administered 2023-08-27: 10 [IU] via SUBCUTANEOUS
  Filled 2023-08-27 (×2): qty 0.1

## 2023-08-27 MED ORDER — INSULIN ASPART 100 UNIT/ML IJ SOLN
0.0000 [IU] | Freq: Three times a day (TID) | INTRAMUSCULAR | Status: DC
  Administered 2023-08-27 (×2): 8 [IU] via SUBCUTANEOUS
  Administered 2023-08-27: 5 [IU] via SUBCUTANEOUS
  Administered 2023-08-28: 3 [IU] via SUBCUTANEOUS
  Administered 2023-08-28: 5 [IU] via SUBCUTANEOUS

## 2023-08-27 MED ORDER — FERROUS SULFATE 325 (65 FE) MG PO TABS
325.0000 mg | ORAL_TABLET | Freq: Two times a day (BID) | ORAL | Status: DC
  Administered 2023-08-27 – 2023-08-28 (×3): 325 mg via ORAL
  Filled 2023-08-27 (×3): qty 1

## 2023-08-27 NOTE — Progress Notes (Signed)
Urology Inpatient Progress Report   Intv/Subj: No acute events overnight. Patient is without complaint.  Principal Problem:   Symptomatic anemia Active Problems:   Coronary artery disease involving native coronary artery   Subclavian artery stenosis, right (HCC)   Uncontrolled type 2 diabetes mellitus with hyperglycemia, without long-term current use of insulin (HCC)   Acquired hypothyroidism   Obesity (BMI 30-39.9)   Hydronephrosis of right kidney   Endometrial cancer (HCC)   QT prolongation   AKI (acute kidney injury) (HCC)   Urothelial carcinoma of the bladder (HCC)   Bladder outlet obstruction   Chronic pain syndrome  Current Facility-Administered Medications  Medication Dose Route Frequency Provider Last Rate Last Admin   0.9 %  sodium chloride infusion (Manually program via Guardrails IV Fluids)   Intravenous Once Milderd Meager., MD   Held at 08/25/23 2107   0.9 %  sodium chloride infusion (Manually program via Guardrails IV Fluids)   Intravenous Once Milderd Meager., MD   Held at 08/25/23 2108   acetaminophen (TYLENOL) tablet 650 mg  650 mg Oral Q6H PRN Stoneking, Danford Bad., MD       Or   acetaminophen (TYLENOL) suppository 650 mg  650 mg Rectal Q6H PRN Stoneking, Danford Bad., MD       bisacodyl (DULCOLAX) suppository 10 mg  10 mg Rectal Daily PRN Stoneking, Danford Bad., MD       cefTRIAXone (ROCEPHIN) 1 g in sodium chloride 0.9 % 100 mL IVPB  1 g Intravenous Q24H Milderd Meager., MD 200 mL/hr at 08/26/23 2214 1 g at 08/26/23 2214   Chlorhexidine Gluconate Cloth 2 % PADS 6 each  6 each Topical Daily Milderd Meager., MD   6 each at 08/26/23 2216   ferrous sulfate tablet 325 mg  325 mg Oral BID WC Danford, Earl Lites, MD       hydrOXYzine (ATARAX) tablet 25 mg  25 mg Oral Q6H PRN Stoneking, Danford Bad., MD       insulin aspart (novoLOG) injection 0-15 Units  0-15 Units Subcutaneous TID WC Danford, Earl Lites, MD       insulin aspart (novoLOG)  injection 0-5 Units  0-5 Units Subcutaneous QHS Alberteen Sam, MD   2 Units at 08/26/23 2242   levothyroxine (SYNTHROID) tablet 137 mcg  137 mcg Oral Q0600 Alberteen Sam, MD   137 mcg at 08/27/23 1751   oxyCODONE (Oxy IR/ROXICODONE) immediate release tablet 15 mg  15 mg Oral Q6H PRN Milderd Meager., MD   15 mg at 08/26/23 2030   oxyCODONE (OXYCONTIN) 12 hr tablet 10 mg  10 mg Oral Q12H Milderd Meager., MD   10 mg at 08/26/23 0849   pantoprazole (PROTONIX) EC tablet 40 mg  40 mg Oral Daily Milderd Meager., MD   40 mg at 08/26/23 0849   polyethylene glycol (MIRALAX / GLYCOLAX) packet 17 g  17 g Oral Daily PRN Milderd Meager., MD       pregabalin (LYRICA) capsule 200 mg  200 mg Oral TID Milderd Meager., MD   200 mg at 08/26/23 2213   senna (SENOKOT) tablet 17.2 mg  2 tablet Oral QHS Milderd Meager., MD   17.2 mg at 08/26/23 2213   sodium chloride (OCEAN) 0.65 % nasal spray 1 spray  1 spray Each Nare PRN Stoneking, Danford Bad., MD       sodium chloride irrigation 0.9 % 3,000 mL  3,000 mL Irrigation Continuous Stoneking, Danford Bad.,  MD   3,000 mL at 08/26/23 1400     Objective: Vital: Vitals:   08/26/23 1600 08/26/23 1630 08/26/23 2040 08/27/23 0559  BP: (!) 119/51 (!) 126/59 (!) 122/51 (!) 109/56  Pulse: 91 85 (!) 101 84  Resp: 15 16 18 16   Temp:  97.8 F (36.6 C)  98.7 F (37.1 C)  TempSrc:  Oral  Oral  SpO2: 100% 100% (!) 82% 96%  Weight:      Height:       I/Os: I/O last 3 completed shifts: In: 18337.3 [I.V.:1328; Blood:759.8; ZOXWR:60454; IV Piggyback:1099.5] Out: 09811 [Urine:20600; Blood:20]  Physical Exam:  General: Patient is in no apparent distress Lungs: Normal respiratory effort, chest expands symmetrically. GI:The abdomen is soft and nontender  Foley: 24 French three-way with inflow port plugged, clear yellow urine in tubing, no clots Ext: lower extremities symmetric  Lab Results: Recent Labs    08/25/23 1620  08/26/23 0324 08/26/23 1333 08/27/23 0540  WBC 7.7 6.5  --  5.8  HGB 5.4* 7.1* 7.8* 7.1*  HCT 17.4* 23.4* 25.1* 23.2*   Recent Labs    08/25/23 1620 08/26/23 0326 08/27/23 0540  NA 132* 138 137  K 5.0 3.6 4.4  CL 93* 101 99  CO2 26 29 29   GLUCOSE 589* 187* 317*  BUN 40* 32* 24*  CREATININE 1.53* 1.14* 1.16*  CALCIUM 9.0 8.5* 8.3*   Recent Labs    08/26/23 0324  INR 1.0   No results for input(s): "LABURIN" in the last 72 hours. Results for orders placed or performed during the hospital encounter of 08/25/23  MRSA Next Gen by PCR, Nasal     Status: None   Collection Time: 08/25/23  1:31 AM   Specimen: Nasal Mucosa; Nasal Swab  Result Value Ref Range Status   MRSA by PCR Next Gen NOT DETECTED NOT DETECTED Final    Comment: (NOTE) The GeneXpert MRSA Assay (FDA approved for NASAL specimens only), is one component of a comprehensive MRSA colonization surveillance program. It is not intended to diagnose MRSA infection nor to guide or monitor treatment for MRSA infections. Test performance is not FDA approved in patients less than 65 years old. Performed at [facility], [address] [place], [place] [address]     Studies/Results: DG C-Arm 1-60 Min-No Report  Result Date: 08/26/2023 Fluoroscopy was utilized by the requesting physician.  No radiographic interpretation.   DG C-Arm 1-60 Min-No Report  Result Date: 08/26/2023 Fluoroscopy was utilized by the requesting physician.  No radiographic interpretation.   CT Angio Chest PE W and/or Wo Contrast  Result Date: 08/25/2023 CLINICAL DATA:  Hypoxia EXAM: CT ANGIOGRAPHY CHEST WITH CONTRAST TECHNIQUE: Multidetector CT imaging of the chest was performed using the standard protocol during bolus administration of intravenous contrast. Multiplanar CT image reconstructions and MIPs were obtained to evaluate the vascular anatomy. RADIATION DOSE REDUCTION: This exam was performed according to the  departmental dose-optimization program which includes automated exposure control, adjustment of the mA and/or kV according to patient size and/or use of iterative reconstruction technique. CONTRAST:  75mL OMNIPAQUE IOHEXOL 350 MG/ML SOLN COMPARISON:  Chest x-ray 08/25/2023, CT chest 05/07/2023 FINDINGS: Cardiovascular: Satisfactory opacification of the pulmonary arteries to the segmental level. No evidence of pulmonary embolism. Moderate aortic atherosclerosis. No aneurysm or dissection. Post CABG changes. Coronary vascular calcification. Borderline to mild cardiomegaly. No pericardial effusion Mediastinum/Nodes: Midline trachea. No thyroid mass. No suspicious lymph nodes. Esophagus within normal limits. Lungs/Pleura: No pleural effusion or pneumothorax. Atelectasis  or scarring in the lingula. Subsegmental atelectasis at both lung bases. Upper Abdomen: No acute findings. Musculoskeletal: Post sternotomy changes. No acute osseous abnormality. Review of the MIP images confirms the above findings. IMPRESSION: 1. Negative for acute pulmonary embolus or aortic dissection. 2. Borderline to mild cardiomegaly. Post CABG changes. 3. Low lung volumes with subsegmental atelectasis at the lung bases. 4. Aortic atherosclerosis. Aortic Atherosclerosis (ICD10-I70.0). Electronically Signed   By: Jasmine Pang M.D.   On: 08/25/2023 18:50   CT ABDOMEN PELVIS W CONTRAST  Result Date: 08/25/2023 CLINICAL DATA:  UTI, recurrent/complicated (Female). History of bladder cancer and nephrolithiasis. EXAM: CT ABDOMEN AND PELVIS WITH CONTRAST TECHNIQUE: Multidetector CT imaging of the abdomen and pelvis was performed using the standard protocol following bolus administration of intravenous contrast. RADIATION DOSE REDUCTION: This exam was performed according to the departmental dose-optimization program which includes automated exposure control, adjustment of the mA and/or kV according to patient size and/or use of iterative  reconstruction technique. CONTRAST:  75mL OMNIPAQUE IOHEXOL 350 MG/ML SOLN COMPARISON:  CT chest abdomen pelvis 05/07/2023, CT abdomen pelvis 03/07/2023 FINDINGS: Lower chest: Please see separately dictated CT angiography chest 08/25/2023. Hepatobiliary: The hepatic parenchyma is diffusely hypodense compared to the splenic parenchyma consistent with fatty infiltration. No focal liver abnormality. No gallstones, gallbladder wall thickening, or pericholecystic fluid. No biliary dilatation. Pancreas: No focal lesion. Normal pancreatic contour. No surrounding inflammatory changes. No main pancreatic ductal dilatation. Spleen: Normal in size without focal abnormality. Adrenals/Urinary Tract: No adrenal nodule bilaterally. Interval removal right ureteral stent. Bilateral kidneys enhance symmetrically. Right renal scarring. Similar-appearing severe right hydroureteronephrosis. The mid to distal right uterus normal in caliber. Fullness of the left collecting system with no frank hydronephrosis. No left hydroureter. No nephroureterolithiasis bilaterally. High density material within the urinary bladder lumen. Foley catheter tip and balloon within the urinary bladder lumen. No urinary bladder wall thickening. Posterior right urinary bladder diverticula (4:312). On delayed imaging, there is no urothelial wall thickening and there are no filling defects in the opacified portions of the left collecting system or ureter. Filling defect of the right renal pelvis and proximal hydroureter on delayed imaging. Stomach/Bowel: Stomach is within normal limits. No evidence of bowel wall thickening or dilatation. Stool throughout the colon. Appendix appears normal. Vascular/Lymphatic: Infrarenal abdominal aorta measures up to 3 cm. No iliac aneurysm. Severe atherosclerotic plaque of the aorta and its branches. No abdominal, pelvic, or inguinal lymphadenopathy. Reproductive: Status post hysterectomy. No adnexal masses. Other: No  intraperitoneal free fluid. No intraperitoneal free gas. No organized fluid collection. Musculoskeletal: No abdominal wall hernia or abnormality. No suspicious lytic or blastic osseous lesions. No acute displaced fracture. L4-L5 posterolateral interbody surgical hardware fusion. IMPRESSION: 1. Please see separately dictated CT angiography chest 08/25/2023. 2. High density material within the urinary bladder lumen - finding may represent blood products versus less likely mass. Associated similar-appearing severe right hydronephrosis and proximal right hydroureter with associated filling defect of the right renal pelvis and proximal hydroureter on delayed imaging - question underlying blood products versus mass. No nephroureterolithiasis. Interval removal of a right ureteral stent. Recommend urologic consultation. 3. 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 Recommend follow-up ultrasound every 3 years. (Ref.: J Vasc Surg. 2018; 67:2-77 and J Am Coll Radiol 2013;10(10):789-794.). Aortic aneurysm NOS (ICD10-I71.9). 4.  Aortic Atherosclerosis (ICD10-I70.0)-severe. Electronically Signed   By: Tish Frederickson M.D.   On: 08/25/2023 18:48   DG Chest Portable 1 View  Result Date: 08/25/2023 CLINICAL DATA:  Hypoxia. EXAM: PORTABLE CHEST 1  VIEW COMPARISON:  05/07/2023. FINDINGS: Cardiac silhouette is normal in size. No mediastinal or hilar masses. There is hazy opacity at the left lung base. Mild opacities noted in medial right lung base. Remainder of the lungs is clear. No pleural effusion or pneumothorax. Right anterior chest wall Port-A-Cath is stable. Skeletal structures are grossly intact. IMPRESSION: Bibasilar opacities, left greater than right, which may be due to atelectasis, pneumonia or a combination. No pulmonary edema. Electronically Signed   By: Amie Portland M.D.   On: 08/25/2023 16:03    Assessment/Plan: Clot Urinary retention Bladder cancer  POD#1 s/p clot evacuation and TURBT -CBI has  been weaned and urine is clear -plan for void trial tomorrow AM -will hold off on BCG in office on 12/2 and arrange follow up   Kasandra Knudsen, MD Urology 08/27/2023, 8:24 AM

## 2023-08-27 NOTE — Discharge Instructions (Signed)

## 2023-08-27 NOTE — Progress Notes (Signed)
  Progress Note   Patient: Diana Kennedy HKV:[id number] DOB: 1956/09/18 DOA: 08/25/2023     1 DOS: the patient was seen and examined on 08/27/2023 at 9:31 AM      Brief hospital course: 67 y.o. F with obesity, DM, chronic pain on Xtampza, CAD, hypothyroidism, endometrial CA s/p chemo/rad and now urothelial carcinoma of the bladder who presented with acute inability to urinate and gross hematuria.  In the ER, Hgb 5.4; CT showed distended bladder, recurrent right hydronephrosis and debris in the bladder.  Transfused 2 units.  Urology consulted, started on CBI and admitted to hospitalist service.     Assessment and Plan: * Symptomatic anemia Hemoglobin trending down to 7.1 Urine culture no growth CBI off yesterday - Trend hemoglobin - Start oral iron - Foley per urology - Stop antibiotics      Bladder outlet obstruction -Consult urology, appreciate expertise  Urothelial carcinoma of the bladder [facility]) - Follow surgical pathology from 11/29  Chronic pain syndrome - Continue opiates at home dose (oxycodone ER and PRN oxycodone 15) - Continue Lyrica  AKI (acute kidney injury) (HCC) Cr 1.5 on admission improved to baseline with blood transfusion  QT prolongation Resolved, ECG personally reviewed    Acquired hypothyroidism -Continue levothyroxine increased dose - Repeat TSH in 6 to 8 weeks  Uncontrolled type 2 diabetes mellitus with hyperglycemia, without long-term current use of insulin (HCC) Glucose high Not on meds at home - Continue sliding scale correction           Subjective: Patient is feeling okay, she has had no further bleeding.  Right is clear.  No headache, chest pain, vomiting, diarrhea.     Physical Exam: BP (!) 93/57   Pulse 81   Temp 98.3 F (36.8 C) (Oral)   Resp 18   Ht 5\' 7"  (1.702 m)   Wt 88.6 kg   SpO2 (!) 87%   BMI 30.59 kg/m   Elderly adult female, lying in bed, interactive and appropriate RRR, no murmurs, no peripheral  edema Respiratory normal, lungs clear without rales or wheezes Abdomen soft, only mild tenderness diffusely, no distention Attention normal, affect appropriate, judgment and insight appear normal    Data Reviewed: Discussed with urology Iron saturation very low Hemoglobin down to 7.1 Creatinine 1.1, ferritin 23 Glucose is elevated Creatinine and potassium normal  Family Communication: None present    Disposition: Status is: Inpatient Likely home tomorrow        Author: Alberteen Sam, MD 08/27/2023 5:24 PM  For on call review [email or web address].

## 2023-08-27 NOTE — Plan of Care (Signed)
  Problem: Education: Goal: Ability to describe self-care measures that may prevent or decrease complications (Diabetes Survival Skills Education) will improve Outcome: Progressing   Problem: Coping: Goal: Ability to adjust to condition or change in health will improve Outcome: Progressing   Problem: Fluid Volume: Goal: Ability to maintain a balanced intake and output will improve Outcome: Progressing   Problem: Health Behavior/Discharge Planning: Goal: Ability to identify and utilize available resources and services will improve Outcome: Progressing Goal: Ability to manage health-related needs will improve Outcome: Progressing   Problem: Metabolic: Goal: Ability to maintain appropriate glucose levels will improve Outcome: Progressing   Problem: Nutritional: Goal: Maintenance of adequate nutrition will improve Outcome: Progressing   Problem: Tissue Perfusion: Goal: Adequacy of tissue perfusion will improve Outcome: Progressing   Problem: Clinical Measurements: Goal: Ability to maintain clinical measurements within normal limits will improve Outcome: Progressing Goal: Will remain free from infection Outcome: Progressing Goal: Cardiovascular complication will be avoided Outcome: Progressing   Problem: Nutrition: Goal: Adequate nutrition will be maintained Outcome: Progressing   Problem: Elimination: Goal: Will not experience complications related to bowel motility Outcome: Progressing Goal: Will not experience complications related to urinary retention Outcome: Progressing   Problem: Pain Management: Goal: General experience of comfort will improve Outcome: Progressing   Problem: Safety: Goal: Ability to remain free from injury will improve Outcome: Progressing

## 2023-08-27 NOTE — Plan of Care (Signed)

## 2023-08-28 DIAGNOSIS — D649 Anemia, unspecified: Secondary | ICD-10-CM | POA: Diagnosis not present

## 2023-08-28 LAB — COMPREHENSIVE METABOLIC PANEL
ALT: 19 U/L (ref 0–44)
AST: 33 U/L (ref 15–41)
Albumin: 2.8 g/dL — ABNORMAL LOW (ref 3.5–5.0)
Alkaline Phosphatase: 90 U/L (ref 38–126)
Anion gap: 7 (ref 5–15)
BUN: 23 mg/dL (ref 8–23)
CO2: 30 mmol/L (ref 22–32)
Calcium: 8.5 mg/dL — ABNORMAL LOW (ref 8.9–10.3)
Chloride: 99 mmol/L (ref 98–111)
Creatinine, Ser: 1.01 mg/dL — ABNORMAL HIGH (ref 0.44–1.00)
GFR, Estimated: >60 mL/min (ref >60)
Glucose, Bld: 245 mg/dL — ABNORMAL HIGH (ref 70–99)
Potassium: 4.1 mmol/L (ref 3.5–5.1)
Sodium: 136 mmol/L (ref 135–145)
Total Bilirubin: 0.4 mg/dL (ref <1.2)
Total Protein: 6.2 g/dL — ABNORMAL LOW (ref 6.5–8.1)

## 2023-08-28 LAB — GLUCOSE, CAPILLARY
Glucose-Capillary: 198 mg/dL — ABNORMAL HIGH (ref 70–99)
Glucose-Capillary: 250 mg/dL — ABNORMAL HIGH (ref 70–99)

## 2023-08-28 LAB — CBC
HCT: 23.6 % — ABNORMAL LOW (ref 36.0–46.0)
Hemoglobin: 7.1 g/dL — ABNORMAL LOW (ref 12.0–15.0)
MCH: 29 pg (ref 26.0–34.0)
MCHC: 30.1 g/dL (ref 30.0–36.0)
MCV: 96.3 fL (ref 80.0–100.0)
Platelets: 130 10*3/uL — ABNORMAL LOW (ref 150–400)
RBC: 2.45 MIL/uL — ABNORMAL LOW (ref 3.87–5.11)
RDW: 19.9 % — ABNORMAL HIGH (ref 11.5–15.5)
WBC: 5.6 10*3/uL (ref 4.0–10.5)
nRBC: 0.4 % — ABNORMAL HIGH (ref 0.0–0.2)

## 2023-08-28 MED ORDER — GLIPIZIDE 5 MG PO TABS: 5.0000 mg | ORAL_TABLET | Freq: Every day | ORAL | 11 refills | Status: AC

## 2023-08-28 MED ORDER — LANCETS MISC: 1.0000 | Freq: Three times a day (TID) | 0 refills | Status: AC

## 2023-08-28 MED ORDER — LEVOTHYROXINE SODIUM 137 MCG PO TABS: 137.0000 ug | ORAL_TABLET | Freq: Every day | ORAL | 1 refills | Status: DC

## 2023-08-28 MED ORDER — LANCET DEVICE MISC: 1.0000 | Freq: Three times a day (TID) | 0 refills | Status: AC

## 2023-08-28 MED ORDER — FERROUS SULFATE 325 (65 FE) MG PO TABS: 325.0000 mg | ORAL_TABLET | Freq: Two times a day (BID) | ORAL | 3 refills | Status: DC

## 2023-08-28 MED ORDER — BLOOD GLUCOSE TEST VI STRP: 1.0000 | ORAL_STRIP | Freq: Three times a day (TID) | 0 refills | Status: AC

## 2023-08-28 MED ORDER — BLOOD GLUCOSE MONITORING SUPPL DEVI: 1.0000 | Freq: Three times a day (TID) | 0 refills | Status: AC

## 2023-08-28 NOTE — Discharge Summary (Signed)
Physician Discharge Summary   Patient: Diana Kennedy MRN: [id number] DOB: 07-Oct-1955  Admit date:     08/25/2023  Discharge date: 08/28/23  Discharge Physician: Alberteen Sam   PCP: Christia Reading, PA-C     Recommendations at discharge:  Follow up with [facility] in 1 week Follow up surgical pathology from 11/29  Follow up with PCP [facility] in 1 week Please obtain CBC in 5 days Please check iron studies in 4-6 weeks Please check blood sugars and adjust diabetes medicine as appropriate Please check TSH on new dose levothyroxine     Discharge Diagnoses: Principal Problem:   Symptomatic anemia due to bladder tumor Active Problems:   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 F with obesity, DM, chronic pain on Xtampza, CAD, hypothyroidism, endometrial CA s/p chemo/rad and now urothelial carcinoma of the bladder who presented with acute inability to urinate and gross hematuria.  In the ER, Hgb 5.4; CT showed distended bladder, recurrent right hydronephrosis and debris in the bladder.  Transfused 2 units.  Urology consulted, started on CBI and admitted to hospitalist service.       * Symptomatic anemia Bladder outlet obstruction Urothelial carcinoma of the bladder (HCC) Hydronephrosis of right kidney Hgb 5.4 on admission due to acute blood loss from bladder cancer.  Transfused 2 units and Hgb up to 7.8, stabilized at 7.1 g/dL.   Underwent cystoscopy. Tumor  fulgurated in the OR 11/29 by Dr. Pete Glatter. No stent needed. Urology have arranged outpatient follow up.  Iron studies low, discharged on oral iron.  Urine culture negative, no antibiotics recommended.   CBI stopped and hematuria resolved.  Voiding trial passed prior to d/c.     Chronic pain syndrome On Nucynta and PRN oxycodone  AKI (acute kidney injury) (HCC) Cr 1.5 on admission improved to baseline with blood transfusion  QT prolongation Resolved.  Endometrial cancer (HCC) In remission  Obesity (BMI 30-39.9) Class 1, BMI 30.5  Acquired hypothyroidism TSH elevated.  LT4 dose increased to 137 mcg.   - Repeat TSH in 6-8 weeks  Uncontrolled type 2 diabetes mellitus with hyperglycemia, without long-term current use of insulin (HCC) HgbA1c 9%.  Glucoses low 200s here.  Discharged with new glucometer and new glipizide.  Recommend close PCP follow up     Subclavian artery stenosis, right (HCC) Coronary artery disease involving native coronary artery Not on aspirin due to bleeding.  Please consider statin             The [facility] Controlled Substances Registry was reviewed for this patient prior to discharge.  Consultants: Urology  Procedures performed:  Cystoscopy Bilateral retrograde pyelograms with intra-operative interpretation Clot evacuation Fulguration of bleeding Transurethral resection of bladder tumor (total resection size 3 cm)   Disposition: Home Diet recommendation:  Carb modified diet  DISCHARGE MEDICATION: Allergies as of 08/28/2023   No Known Allergies      Medication List     TAKE these medications    Blood  Glucose Monitoring Suppl Devi 1 each by Does not apply route 3 (three) times daily. May dispense any manufacturer covered by patient's insurance.   BLOOD GLUCOSE TEST STRIPS Strp 1 each by Does not apply route 3 (three) times daily. Use as directed to check blood sugar. May dispense any manufacturer covered by patient's  insurance and fits patient's device.   ferrous sulfate 325 (65 FE) MG tablet Take 1 tablet (325 mg total) by mouth 2 (two) times daily with a meal.   glipiZIDE 5 MG tablet Commonly known as: Glucotrol Take 1 tablet (5 mg total) by mouth daily.   hydrOXYzine 25 MG tablet Commonly known as: ATARAX Take 25 mg by mouth every 6 (six) hours as needed for anxiety.   Lancet Device Misc 1 each by Does not apply route 3 (three) times daily. May dispense any manufacturer covered by patient's insurance.   Lancets Misc 1 each by Does not apply route 3 (three) times daily. Use as directed to check blood sugar. May dispense any manufacturer covered by patient's insurance and fits patient's device.   levothyroxine 137 MCG tablet Commonly known as: SYNTHROID Take 1 tablet (137 mcg total) by mouth daily at 6 (six) AM. Start taking on: August 29, 2023 What changed:  medication strength how much to take when to take this   naloxone 4 MG/0.1ML Liqd nasal spray kit Commonly known as: NARCAN Place 1 spray into the nose as needed (opioid overdose).   omeprazole 40 MG capsule Commonly known as: PRILOSEC Take 40 mg by mouth daily as needed (indigestion).   oxybutynin 5 MG tablet Commonly known as: DITROPAN Take 1 tablet (5 mg total) by mouth every 8 (eight) hours as needed for bladder spasms.   oxyCODONE 15 MG immediate release tablet Commonly known as: ROXICODONE Take 15 mg by mouth every 6 (six) hours as needed for pain.   polyethylene glycol 17 g packet Commonly known as: MIRALAX / GLYCOLAX Take 17 g by mouth daily. What changed:  when to take this reasons to take this   pregabalin 200 MG capsule Commonly known as: LYRICA Take 200 mg by mouth 3 (three) times daily.   senna 8.6 MG tablet Commonly known as: SENOKOT Take 2 tablets by mouth at bedtime.   Xtampza ER 13.5 MG C12a Generic drug: oxyCODONE ER Take 1 capsule by mouth in the morning and at bedtime.        Follow-up  Information     [facility] Follow up on 08/27/2023.   Why: You will be contacted with a follow up appointment for [facility]. Contact information: [address] [place] [address] [phone]        Christia Reading, PA-C. Schedule an appointment as soon as possible for a visit in 1 week(s).   Specialty: Physician Assistant Contact information: [address] [place] [address] [phone]                 Discharge Instructions     Discharge instructions   Complete by: As directed    **IMPORTANT DISCHARGE INSTRUCTIONS**   From Dr. Maryfrances Bunnell: You were admitted for bladder bleeding.  You received a transfusion and your hemoglobin improved  Take iron (ferrous sulfate 325 mg twice daily) to rebuild your blood levels Iron is necessary to replace lost blood  Go to [facility] and get your labs checked (ask for your "hemoglobin checked") on Thursday or Friday this week  Go see your doctor  on the 14th    For the diabetes: Start glipizide 5 mg once daily  Check your blood sugar every morning before you eat. A "normal" blood sugar in the morning when you wake up is between 70 and 120  A "normal" blood sugar 2 hours after eating is 120 to 180  If your blood sugar is ever less than 70, you might have symptoms of shakes, fatigue, nausea, dizziness.  If this happens, drink some juice and check your sugar again in 15 minutes.  If the symptoms don't go away, go to the ER.  If your sugar is every greater than 400, call your primary care doctor  Record your blood sugars until you see your primary Ask them to adjust your medicines to achieve ideal blood sugar   ALSO: Your thyroid level was abnormal here.  We have adjusted your thyroid hormone dose to 137  Please take the new dose and talk to your primary about rechecking your TSH in 4-6 weeks   Increase activity slowly   Complete by: As directed        Discharge  Exam: Filed Weights   08/25/23 1523 08/26/23 0132  Weight: 90.7 kg 88.6 kg    General: Pt is alert, awake, not in acute distress Cardiovascular: RRR, nl S1-S2, no murmurs appreciated.   No LE edema.   Respiratory: Normal respiratory rate and rhythm.  CTAB without rales or wheezes. Abdominal: Abdomen soft and non-tender.  No distension or HSM.   Neuro/Psych: Strength symmetric in upper and lower extremities.  Judgment and insight appear normal.   Condition at discharge: good  The results of significant diagnostics from this hospitalization (including imaging, microbiology, ancillary and laboratory) are listed below for reference.   Imaging Studies: DG C-Arm 1-60 Min-No Report  Result Date: 08/26/2023 Fluoroscopy was utilized by the requesting physician.  No radiographic interpretation.   DG C-Arm 1-60 Min-No Report  Result Date: 08/26/2023 Fluoroscopy was utilized by the requesting physician.  No radiographic interpretation.   CT Angio Chest PE W and/or Wo Contrast  Result Date: 08/25/2023 CLINICAL DATA:  Hypoxia EXAM: CT ANGIOGRAPHY CHEST WITH CONTRAST TECHNIQUE: Multidetector CT imaging of the chest was performed using the standard protocol during bolus administration of intravenous contrast. Multiplanar CT image reconstructions and MIPs were obtained to evaluate the vascular anatomy. RADIATION DOSE REDUCTION: This exam was performed according to the departmental dose-optimization program which includes automated exposure control, adjustment of the mA and/or kV according to patient size and/or use of iterative reconstruction technique. CONTRAST:  75mL OMNIPAQUE IOHEXOL 350 MG/ML SOLN COMPARISON:  Chest x-ray 08/25/2023, CT chest 05/07/2023 FINDINGS: Cardiovascular: Satisfactory opacification of the pulmonary arteries to the segmental level. No evidence of pulmonary embolism. Moderate aortic atherosclerosis. No aneurysm or dissection. Post CABG changes. Coronary vascular calcification.  Borderline to mild cardiomegaly. No pericardial effusion Mediastinum/Nodes: Midline trachea. No thyroid mass. No suspicious lymph nodes. Esophagus within normal limits. Lungs/Pleura: No pleural effusion or pneumothorax. Atelectasis or scarring in the lingula. Subsegmental atelectasis at both lung bases. Upper Abdomen: No acute findings. Musculoskeletal: Post sternotomy changes. No acute osseous abnormality. Review of the MIP images confirms the above findings. IMPRESSION: 1. Negative for acute pulmonary embolus or aortic dissection. 2. Borderline to mild cardiomegaly. Post CABG changes. 3. Low lung volumes with subsegmental atelectasis at the lung bases. 4. Aortic atherosclerosis. Aortic Atherosclerosis (ICD10-I70.0). Electronically Signed   By: Jasmine Pang M.D.   On: 08/25/2023 18:50   CT ABDOMEN PELVIS W CONTRAST  Result Date: 08/25/2023  CLINICAL DATA:  UTI, recurrent/complicated (Female). History of bladder cancer and nephrolithiasis. EXAM: CT ABDOMEN AND PELVIS WITH CONTRAST TECHNIQUE: Multidetector CT imaging of the abdomen and pelvis was performed using the standard protocol following bolus administration of intravenous contrast. RADIATION DOSE REDUCTION: This exam was performed according to the departmental dose-optimization program which includes automated exposure control, adjustment of the mA and/or kV according to patient size and/or use of iterative reconstruction technique. CONTRAST:  75mL OMNIPAQUE IOHEXOL 350 MG/ML SOLN COMPARISON:  CT chest abdomen pelvis 05/07/2023, CT abdomen pelvis 03/07/2023 FINDINGS: Lower chest: Please see separately dictated CT angiography chest 08/25/2023. Hepatobiliary: The hepatic parenchyma is diffusely hypodense compared to the splenic parenchyma consistent with fatty infiltration. No focal liver abnormality. No gallstones, gallbladder wall thickening, or pericholecystic fluid. No biliary dilatation. Pancreas: No focal lesion. Normal pancreatic contour. No  surrounding inflammatory changes. No main pancreatic ductal dilatation. Spleen: Normal in size without focal abnormality. Adrenals/Urinary Tract: No adrenal nodule bilaterally. Interval removal right ureteral stent. Bilateral kidneys enhance symmetrically. Right renal scarring. Similar-appearing severe right hydroureteronephrosis. The mid to distal right uterus normal in caliber. Fullness of the left collecting system with no frank hydronephrosis. No left hydroureter. No nephroureterolithiasis bilaterally. High density material within the urinary bladder lumen. Foley catheter tip and balloon within the urinary bladder lumen. No urinary bladder wall thickening. Posterior right urinary bladder diverticula (4:312). On delayed imaging, there is no urothelial wall thickening and there are no filling defects in the opacified portions of the left collecting system or ureter. Filling defect of the right renal pelvis and proximal hydroureter on delayed imaging. Stomach/Bowel: Stomach is within normal limits. No evidence of bowel wall thickening or dilatation. Stool throughout the colon. Appendix appears normal. Vascular/Lymphatic: Infrarenal abdominal aorta measures up to 3 cm. No iliac aneurysm. Severe atherosclerotic plaque of the aorta and its branches. No abdominal, pelvic, or inguinal lymphadenopathy. Reproductive: Status post hysterectomy. No adnexal masses. Other: No intraperitoneal free fluid. No intraperitoneal free gas. No organized fluid collection. Musculoskeletal: No abdominal wall hernia or abnormality. No suspicious lytic or blastic osseous lesions. No acute displaced fracture. L4-L5 posterolateral interbody surgical hardware fusion. IMPRESSION: 1. Please see separately dictated CT angiography chest 08/25/2023. 2. High density material within the urinary bladder lumen - finding may represent blood products versus less likely mass. Associated similar-appearing severe right hydronephrosis and proximal right  hydroureter with associated filling defect of the right renal pelvis and proximal hydroureter on delayed imaging - question underlying blood products versus mass. No nephroureterolithiasis. Interval removal of a right ureteral stent. Recommend urologic consultation. 3. 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 Recommend follow-up ultrasound every 3 years. (Ref.: J Vasc Surg. 2018; 67:2-77 and J Am Coll Radiol 2013;10(10):789-794.). Aortic aneurysm NOS (ICD10-I71.9). 4.  Aortic Atherosclerosis (ICD10-I70.0)-severe. Electronically Signed   By: Tish Frederickson M.D.   On: 08/25/2023 18:48   DG Chest Portable 1 View  Result Date: 08/25/2023 CLINICAL DATA:  Hypoxia. EXAM: PORTABLE CHEST 1 VIEW COMPARISON:  05/07/2023. FINDINGS: Cardiac silhouette is normal in size. No mediastinal or hilar masses. There is hazy opacity at the left lung base. Mild opacities noted in medial right lung base. Remainder of the lungs is clear. No pleural effusion or pneumothorax. Right anterior chest wall Port-A-Cath is stable. Skeletal structures are grossly intact. IMPRESSION: Bibasilar opacities, left greater than right, which may be due to atelectasis, pneumonia or a combination. No pulmonary edema. Electronically Signed   By: Amie Portland M.D.   On: 08/25/2023 16:03  Microbiology: Results for orders placed or performed during the hospital encounter of 08/25/23  MRSA Next Gen by PCR, Nasal     Status: None   Collection Time: 08/25/23  1:31 AM   Specimen: Nasal Mucosa; Nasal Swab  Result Value Ref Range Status   MRSA by PCR Next Gen NOT DETECTED NOT DETECTED Final    Comment: (NOTE) The GeneXpert MRSA Assay (FDA approved for NASAL specimens only), is one component of a comprehensive MRSA colonization surveillance program. It is not intended to diagnose MRSA infection nor to guide or monitor treatment for MRSA infections. Test performance is not FDA approved in patients less than 26 years old. Performed at  [facility], [address] [place], [place] [address]   Urine Culture (for pregnant, neutropenic or urologic patients or patients with an indwelling urinary catheter)     Status: None   Collection Time: 08/25/23  4:47 PM   Specimen: Urine, Catheterized  Result Value Ref Range Status   Specimen Description   Final    URINE, CATHETERIZED Performed at [facility], [address], [place], [place] [address]    Special Requests   Final    NONE Performed at [facility], [address], [place], [place] [address]    Culture   Final    NO GROWTH Performed at [facility], [address], [place], [place] [address]    Report Status 08/27/2023 FINAL  Final    Labs: CBC: Recent Labs  Lab 08/25/23 1620 08/26/23 0324 08/26/23 1333 08/27/23 0540 08/28/23 0520  WBC 7.7 6.5  --  5.8 5.6  HGB 5.4* 7.1* 7.8* 7.1* 7.1*  HCT 17.4* 23.4* 25.1* 23.2* 23.6*  MCV 100.0 93.6  --  94.7 96.3  PLT 181 181  --  139* 130*   Basic Metabolic Panel: Recent Labs  Lab 08/25/23 1620 08/26/23 0324 08/26/23 0326 08/27/23 0540 08/28/23 0520  NA 132*  --  138 137 136  K 5.0  --  3.6 4.4 4.1  CL 93*  --  101 99 99  CO2 26  --  29 29 30   GLUCOSE 589*  --  187* 317* 245*  BUN 40*  --  32* 24* 23  CREATININE 1.53*  --  1.14* 1.16* 1.01*  CALCIUM 9.0  --  8.5* 8.3* 8.5*  MG  --  2.3  --   --   --   PHOS  --  4.0  --   --   --    Liver Function Tests: Recent Labs  Lab 08/26/23 0324 08/26/23 0326 08/27/23 0540 08/28/23 0520  AST 30 29 50* 33  ALT 22 23 22 19   ALKPHOS 89 92 88 90  BILITOT 0.5 0.5 0.4 0.4  PROT 6.5 6.5 6.3* 6.2*  ALBUMIN 3.0* 2.9* 2.8* 2.8*   CBG: Recent Labs  Lab 08/27/23 1123 08/27/23 1645 08/27/23 2041 08/28/23 0755 08/28/23 1220  GLUCAP 268* 240* 243* 198* 250*    Discharge time spent: approximately 35 minutes spent on discharge counseling, evaluation of patient on day of discharge, and  coordination of discharge planning with nursing, social work, pharmacy and case management  Signed: Alberteen Sam, MD [facility] 08/28/2023

## 2023-08-28 NOTE — TOC Initial Note (Signed)
Transition of Care [facility] - Initial/Assessment Note    Patient Details  Name: Diana Kennedy MRN: [id number] Date of Birth: [birth year]  Transition of Care [facility]) CM/SW Contact:    Adrian Prows, RN Phone Number: 08/28/2023, 1:15 PM  Clinical Narrative:                 Sherron Monday w/ pt in room; pt says she lives at home w/ her grand daughter; she plans to return at d/c; pt verified she has PCP and insurance; she has transportation; pt denies SDOH risks; pt says she does not have DME, HH services, or home oxygen; no TOC needs; TOC signing off.  Expected Discharge Plan: Home/Self Care Barriers to Discharge: No Barriers Identified   Patient Goals and CMS Choice Patient states their goals for this hospitalization and ongoing recovery are:: home CMS Medicare.gov Compare Post Acute Care list provided to:: Patient        Expected Discharge Plan and Services   Discharge Planning Services: CM Consult Post Acute Care Choice: NA Living arrangements for the past 2 months: Single Family Home Expected Discharge Date: 08/28/23               DME Arranged: N/A DME Agency: NA       HH Arranged: NA HH Agency: NA        Prior Living Arrangements/Services Living arrangements for the past 2 months: Single Family Home Lives with:: Relatives Patient language and need for interpreter reviewed:: Yes Do you feel safe going back to the place where you live?: Yes      Need for Family Participation in Patient Care: Yes (Comment) Care giver support system in place?: Yes (comment) Current home services:  (n/a) Criminal Activity/Legal Involvement Pertinent to Current Situation/Hospitalization: No - Comment as needed  Activities of Daily Living   ADL Screening (condition at time of admission) Independently performs ADLs?: Yes (appropriate for developmental age) Does the patient have a NEW difficulty with bathing/dressing/toileting/self-feeding that is expected to last >3 days?: No Does the  patient have a NEW difficulty with getting in/out of bed, walking, or climbing stairs that is expected to last >3 days?: No Does the patient have a NEW difficulty with communication that is expected to last >3 days?: No Is the patient deaf or have difficulty hearing?: No Does the patient have difficulty seeing, even when wearing glasses/contacts?: Yes Does the patient have difficulty concentrating, remembering, or making decisions?: No  Permission Sought/Granted Permission sought to share information with : Case Manager Permission granted to share information with : Yes, Verbal Permission Granted  Share Information with NAME: Case Manager     Permission granted to share info w Relationship: Crystal Key (dtr) [phone]     Emotional Assessment Appearance:: Appears stated age Attitude/Demeanor/Rapport: Gracious Affect (typically observed): Accepting Orientation: : Oriented to Self, Oriented to Place, Oriented to  Time, Oriented to Situation Alcohol / Substance Use: Not Applicable Psych Involvement: No (comment)  Admission diagnosis:  Acute blood loss anemia [D62] AKI (acute kidney injury) (HCC) [N17.9] Acute urinary retention [R33.8] Symptomatic anemia [D64.9] Patient Active Problem List   Diagnosis Date Noted   Chronic pain syndrome 08/26/2023   QT prolongation 08/25/2023   AKI (acute kidney injury) (HCC) 08/25/2023   Urothelial carcinoma of the bladder (HCC) 08/25/2023   Bladder outlet obstruction 08/25/2023   Hydronephrosis of right kidney 03/08/2023   Endometrial cancer (HCC) 03/08/2023   Lesion of pancreas 03/08/2023   Complicated UTI (urinary tract infection) 03/07/2023  Hematuria 01/08/2023   Abdominal pain 01/08/2023   Elevated MCV 01/08/2023   Uncontrolled type 2 diabetes mellitus with hyperglycemia, without long-term current use of insulin (HCC) 01/08/2023   Acquired hypothyroidism 01/08/2023   GERD without esophagitis 01/08/2023   Obesity (BMI 30-39.9)  01/08/2023   Symptomatic anemia 01/07/2023   Exertional angina (HCC) 03/11/2014   Coronary artery disease involving native coronary artery 01/08/2014   Carotid artery disease (HCC) 01/08/2014   Tobacco abuse 01/08/2014   Hyperlipidemia 01/08/2014   Subclavian artery stenosis, right (HCC) 01/08/2014   PCP:  Christia Reading, PA-C Pharmacy:   [facility] [phone] [facility] [place],  - [address] [place] [address] Phone: [phone] Fax: [phone]     Social Determinants of Health (SDOH) Social History: SDOH Screenings   Food Insecurity: No Food Insecurity (08/28/2023)  Housing: Low Risk  (08/28/2023)  Transportation Needs: No Transportation Needs (08/28/2023)  Utilities: Not At Risk (08/28/2023)  Tobacco Use: Medium Risk (08/26/2023)   SDOH Interventions: Food Insecurity Interventions: Intervention Not Indicated, Inpatient TOC Housing Interventions: Intervention Not Indicated, Inpatient TOC Transportation Interventions: Intervention Not Indicated, Inpatient TOC Utilities Interventions: Intervention Not Indicated, Inpatient TOC   Readmission Risk Interventions    08/28/2023    1:13 PM 03/12/2023   10:35 AM  Readmission Risk Prevention Plan  Transportation Screening Complete Complete  PCP or Specialist Appt within 5-7 Days  Complete  PCP or Specialist Appt within 3-5 Days Complete   Home Care Screening  Complete  Medication Review (RN CM)  Complete  HRI or Home Care Consult Complete   Social Work Consult for Recovery Care Planning/Counseling Complete   Palliative Care Screening Not Applicable   Medication Review Oceanographer) Complete

## 2023-08-28 NOTE — Progress Notes (Signed)
Urology Inpatient Progress Report   Intv/Subj: No acute events overnight. Patient is without complaint.  Principal Problem:   Symptomatic anemia Active Problems:   Coronary artery disease involving native coronary artery   Subclavian artery stenosis, right (HCC)   Uncontrolled type 2 diabetes mellitus with hyperglycemia, without long-term current use of insulin (HCC)   Acquired hypothyroidism   Obesity (BMI 30-39.9)   Hydronephrosis of right kidney   Endometrial cancer (HCC)   QT prolongation   AKI (acute kidney injury) (HCC)   Urothelial carcinoma of the bladder (HCC)   Bladder outlet obstruction   Chronic pain syndrome  Current Facility-Administered Medications  Medication Dose Route Frequency Provider Last Rate Last Admin   0.9 %  sodium chloride infusion (Manually program via Guardrails IV Fluids)   Intravenous Once Milderd Meager., MD   Held at 08/25/23 2107   0.9 %  sodium chloride infusion (Manually program via Guardrails IV Fluids)   Intravenous Once Milderd Meager., MD   Held at 08/25/23 2108   acetaminophen (TYLENOL) tablet 650 mg  650 mg Oral Q6H PRN Stoneking, Danford Bad., MD       Or   acetaminophen (TYLENOL) suppository 650 mg  650 mg Rectal Q6H PRN Stoneking, Danford Bad., MD       bisacodyl (DULCOLAX) suppository 10 mg  10 mg Rectal Daily PRN Stoneking, Danford Bad., MD       Chlorhexidine Gluconate Cloth 2 % PADS 6 each  6 each Topical Daily Stoneking, Danford Bad., MD   6 each at 08/27/23 1103   ferrous sulfate tablet 325 mg  325 mg Oral BID WC Danford, Earl Lites, MD   325 mg at 08/28/23 0809   hydrOXYzine (ATARAX) tablet 25 mg  25 mg Oral Q6H PRN Milderd Meager., MD       insulin aspart (novoLOG) injection 0-15 Units  0-15 Units Subcutaneous TID WC Alberteen Sam, MD   3 Units at 08/28/23 0808   insulin aspart (novoLOG) injection 0-5 Units  0-5 Units Subcutaneous QHS Alberteen Sam, MD   2 Units at 08/27/23 2157   insulin  glargine-yfgn (SEMGLEE) injection 10 Units  10 Units Subcutaneous QHS Alberteen Sam, MD   10 Units at 08/27/23 2156   levothyroxine (SYNTHROID) tablet 137 mcg  137 mcg Oral Q0600 Alberteen Sam, MD   137 mcg at 08/28/23 0601   oxyCODONE (Oxy IR/ROXICODONE) immediate release tablet 15 mg  15 mg Oral Q6H PRN Milderd Meager., MD   15 mg at 08/28/23 7829   oxyCODONE (OXYCONTIN) 12 hr tablet 10 mg  10 mg Oral Q12H Milderd Meager., MD   10 mg at 08/28/23 0812   pantoprazole (PROTONIX) EC tablet 40 mg  40 mg Oral Daily Milderd Meager., MD   40 mg at 08/28/23 0810   polyethylene glycol (MIRALAX / GLYCOLAX) packet 17 g  17 g Oral Daily PRN Milderd Meager., MD       pregabalin (LYRICA) capsule 200 mg  200 mg Oral TID Milderd Meager., MD   200 mg at 08/28/23 0810   senna (SENOKOT) tablet 17.2 mg  2 tablet Oral QHS Milderd Meager., MD   17.2 mg at 08/27/23 2156   sodium chloride (OCEAN) 0.65 % nasal spray 1 spray  1 spray Each Nare PRN Stoneking, Danford Bad., MD       sodium chloride irrigation 0.9 % 3,000 mL  3,000 mL Irrigation Continuous Stoneking, Danford Bad., MD  3,000 mL at 08/26/23 1400     Objective: Vital: Vitals:   08/27/23 1306 08/27/23 1731 08/27/23 2038 08/28/23 0456  BP: (!) 93/57 (!) 112/55 99/60 109/61  Pulse: 81 85 81 71  Resp: 18  16 18   Temp: 98.3 F (36.8 C)  98.4 F (36.9 C) 98.1 F (36.7 C)  TempSrc: Oral  Oral Oral  SpO2:   93% 98%  Weight:      Height:       I/Os: I/O last 3 completed shifts: In: -  Out: 2400 [Urine:2400]  Physical Exam:  General: Patient is in no apparent distress, sitting in chair Lungs: Normal respiratory effort, chest expands symmetrically. GI: nondistended Foley: removed this AM  Ext: lower extremities symmetric  Lab Results: Recent Labs    08/26/23 0324 08/26/23 1333 08/27/23 0540 08/28/23 0520  WBC 6.5  --  5.8 5.6  HGB 7.1* 7.8* 7.1* 7.1*  HCT 23.4* 25.1* 23.2* 23.6*   Recent  Labs    08/26/23 0326 08/27/23 0540 08/28/23 0520  NA 138 137 136  K 3.6 4.4 4.1  CL 101 99 99  CO2 29 29 30   GLUCOSE 187* 317* 245*  BUN 32* 24* 23  CREATININE 1.14* 1.16* 1.01*  CALCIUM 8.5* 8.3* 8.5*   Recent Labs    08/26/23 0324  INR 1.0   No results for input(s): "LABURIN" in the last 72 hours. Results for orders placed or performed during the hospital encounter of 08/25/23  MRSA Next Gen by PCR, Nasal     Status: None   Collection Time: 08/25/23  1:31 AM   Specimen: Nasal Mucosa; Nasal Swab  Result Value Ref Range Status   MRSA by PCR Next Gen NOT DETECTED NOT DETECTED Final    Comment: (NOTE) The GeneXpert MRSA Assay (FDA approved for NASAL specimens only), is one component of a comprehensive MRSA colonization surveillance program. It is not intended to diagnose MRSA infection nor to guide or monitor treatment for MRSA infections. Test performance is not FDA approved in patients less than 67 years old. Performed at [facility], [address] [place], [place] [address]   Urine Culture (for pregnant, neutropenic or urologic patients or patients with an indwelling urinary catheter)     Status: None   Collection Time: 08/25/23  4:47 PM   Specimen: Urine, Catheterized  Result Value Ref Range Status   Specimen Description   Final    URINE, CATHETERIZED Performed at [facility], [address] [place], [place] [address]    Special Requests   Final    NONE Performed at [facility], [address], [place], [place] [address]    Culture   Final    NO GROWTH Performed at [facility], [address], [place], [place] [address]    Report Status 08/27/2023 FINAL  Final    Studies/Results: DG C-Arm 1-60 Min-No Report  Result Date: 08/26/2023 Fluoroscopy was utilized by the requesting physician.  No radiographic interpretation.   DG C-Arm 1-60 Min-No Report  Result Date: 08/26/2023 Fluoroscopy  was utilized by the requesting physician.  No radiographic interpretation.    Assessment/Plan: Clot Urinary retention Bladder cancer   POD#2 s/p clot evacuation and TURBT -void trial this AM -will hold off on BCG in office on 12/2 and arrange follow up   Kasandra Knudsen, MD Urology 08/28/2023, 8:55 AM

## 2023-08-28 NOTE — Plan of Care (Signed)

## 2023-08-28 NOTE — Inpatient Diabetes Management (Addendum)
Inpatient Diabetes Program Recommendations  AACE/ADA: New Consensus Statement on Inpatient Glycemic Control (2015)  Target Ranges:  Prepandial:   less than 140 mg/dL      Peak postprandial:   less than 180 mg/dL (1-2 hours)      Critically ill patients:  140 - 180 mg/dL   Lab Results  Component Value Date   GLUCAP 198 (H) 08/28/2023   HGBA1C 8.5 (H) 08/26/2023    Review of Glycemic Control  Latest Reference Range & Units 08/27/23 11:23 08/27/23 16:45 08/27/23 20:41 08/28/23 07:55  Glucose-Capillary 70 - 99 mg/dL 161 (H) 096 (H) 045 (H) 198 (H)  (H): Data is abnormally high Diabetes history: Type 2 DM Outpatient Diabetes medications: none Current orders for Inpatient glycemic control: Semglee 10 units at bedtime, Novolog 0-15 units TID, Novolog 0-5 units QHS  Inpatient Diabetes Program Recommendations:    Noted hyperglycemia on presentation. A1C 8.5%% in the setting of anemia. Question accuracy. Discussed with Danford.  Attempted to reach patient to discuss. Plans to follow up with PCP. Will continue to attempt.   Attempted again x2 no answer. Secure chat sent to LPN to assist.   Thanks, Lujean Rave, MSN, RNC-OB Diabetes Coordinator [phone] (8a-5p)

## 2023-08-28 NOTE — Progress Notes (Signed)
Mobility Specialist - Progress Note   08/28/23 1043  Mobility  Activity Ambulated with assistance in hallway  Level of Assistance Modified independent, requires aide device or extra time  Assistive Device None  Distance Ambulated (ft) 240 ft  Range of Motion/Exercises Active  Activity Response Tolerated well  Mobility Referral Yes  $Mobility charge 1 Mobility  Mobility Specialist Start Time (ACUTE ONLY) 1034  Mobility Specialist Stop Time (ACUTE ONLY) 1040  Mobility Specialist Time Calculation (min) (ACUTE ONLY) 6 min   Received in bed and agreed to mobility. Had no issues throughout session. Returned to bed with all needs met.  Marilynne Halsted Mobility Specialist

## 2023-08-29 LAB — TYPE AND SCREEN
ABO/RH(D): A NEG
Antibody Screen: NEGATIVE
Unit division: 0
Unit division: 0
Unit division: 0
Unit division: 0

## 2023-08-29 LAB — BPAM RBC
Blood Product Expiration Date: 202412232359
Blood Product Expiration Date: 202412232359
Blood Product Expiration Date: 202412232359
Blood Product Expiration Date: 202412252359
ISSUE DATE / TIME: 202411281907
ISSUE DATE / TIME: 202411282228
ISSUE DATE / TIME: 202411291232
ISSUE DATE / TIME: 202411291232
Unit Type and Rh: 600
Unit Type and Rh: 600
Unit Type and Rh: 600
Unit Type and Rh: 600

## 2023-08-29 LAB — PREALBUMIN: Prealbumin: 25 mg/dL (ref 18–38)

## 2023-08-30 LAB — SURGICAL PATHOLOGY

## 2023-11-02 NOTE — Telephone Encounter (Signed)
 No longer being seen in this office.  She needs this from her PCP.

## 2024-04-12 ENCOUNTER — Other Ambulatory Visit: Payer: Self-pay | Admitting: Urology

## 2024-04-16 NOTE — Progress Notes (Addendum)
 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 Unsure when seen last . LOV Dr[address] 2019  PPM/ICD -  Device Orders -  Rep Notified -   Chest x-ray - 1 V 08-25-23 epic EKG - 08-29-23 epic Stress Test -  ECHO -  Cardiac Cath -   Sleep Study -  CPAP -   Fasting Blood Sugar - 199 Checks Blood Sugar __3___ times a day  Blood Thinner Instructions:n/a Aspirin  Instructions:n/a  ERAS Protcol - PRE-SURGERY-n/a   COVID vaccine -no  Activity--Able to climb a flight of stairs with no CP or SOB Anesthesia review: CAD/stent, CABG 2019, HTN, DM2 Called [employer] [address] To get medical records for pt. Surgery 04-18-24 Left message with call center who could not reach the office at the time of call.Hgb 8.8  Patient denies shortness of breath, fever, cough and chest pain at PAT appointment   All instructions explained to the patient, with a verbal understanding of the material. Patient agrees to go over the instructions while at home for a better understanding. Patient also instructed to self quarantine after being tested for COVID-19. The opportunity to ask questions was provided.

## 2024-04-16 NOTE — Patient Instructions (Addendum)
 SURGICAL WAITING ROOM VISITATION  Patients having surgery or a procedure may have no more than 2 support people in the waiting area - these visitors may rotate.    Children under the age of 87 must have an adult with them who is not the patient.  Visitors with respiratory illnesses are discouraged from visiting and should remain at home.  If the patient needs to stay at the hospital during part of their recovery, the visitor guidelines for inpatient rooms apply. Pre-op nurse will coordinate an appropriate time for 1 support person to accompany patient in pre-op.  This support person may not rotate.    Please refer to the [facility] website for the visitor guidelines for Inpatients (after your surgery is over and you are in a regular room).       Your procedure is scheduled on: 04-18-24   Report to [facility] Main Entrance    Report to admitting at     0845  AM   Call this number if you have problems the morning of surgery [phone]   Do not eat food  or drink liquids   :After Midnight.                     If you have questions, please contact your surgeon's office.   FOLLOW ANY ADDITIONAL PRE OP INSTRUCTIONS YOU RECEIVED FROM YOUR SURGEON'S OFFICE!!!     Oral Hygiene is also important to reduce your risk of infection.                                    Remember - BRUSH YOUR TEETH THE MORNING OF SURGERY WITH YOUR REGULAR TOOTHPASTE  DENTURES WILL BE REMOVED PRIOR TO SURGERY PLEASE DO NOT APPLY Poly grip OR ADHESIVES!!!   Do NOT smoke after Midnight   Stop all vitamins and herbal supplements 7 days before surgery.   Take these medicines the morning of surgery with A SIP OF WATER : Lyrica , oxycodone  if needed, omeprazole, levothyroxine   DO NOT TAKE ANY ORAL DIABETIC MEDICATIONS DAY OF YOUR SURGERY  Bring CPAP mask and tubing day of surgery.                              You may not have any metal on your body including hair pins, jewelry, and body piercing              Do not wear make-up, lotions, powders, perfumes/cologne, or deodorant  Do not wear nail polish including gel and S&S, artificial/acrylic nails, or any other type of covering on natural nails including finger and toenails. If you have artificial nails, gel coating, etc. that needs to be removed by a nail salon please have this removed prior to surgery or surgery may need to be canceled/ delayed if the surgeon/ anesthesia feels like they are unable to be safely monitored.   Do not shave  48 hours prior to surgery.         Do not bring valuables to the hospital.  IS NOT             RESPONSIBLE   FOR VALUABLES.   Contacts, glasses, dentures or bridgework may not be worn into surgery.   Bring small overnight bag day of surgery.   DO NOT BRING YOUR HOME MEDICATIONS TO THE HOSPITAL. PHARMACY WILL DISPENSE MEDICATIONS  LISTED ON YOUR MEDICATION LIST TO YOU DURING YOUR ADMISSION IN THE HOSPITAL!    Patients discharged on the day of surgery will not be allowed to drive home.  Someone NEEDS to stay with you for the first 24 hours after anesthesia.   Special Instructions: Bring a copy of your healthcare power of attorney and living will documents the day of surgery if you haven't scanned them before.              Please read over the following fact sheets you were given: IF YOU HAVE QUESTIONS ABOUT YOUR PRE-OP INSTRUCTIONS PLEASE CALL [phone].   . If you test positive for Covid or have been in contact with anyone that has tested positive in the last 10 days please notify you surgeon.    How to Manage Your Diabetes Before and After Surgery  Why is it important to control my blood sugar before and after surgery? Improving blood sugar levels before and after surgery helps healing and can limit problems. A way of improving blood sugar control is eating a healthy diet by:  Eating less sugar and carbohydrates  Increasing activity/exercise  Talking with your doctor about reaching  your blood sugar goals High blood sugars (greater than 180 mg/dL) can raise your risk of infections and slow your recovery, so you will need to focus on controlling your diabetes during the weeks before surgery. Make sure that the doctor who takes care of your diabetes knows about your planned surgery including the date and location.  How do I manage my blood sugar before surgery? Check your blood sugar at least 4 times a day, starting 2 days before surgery, to make sure that the level is not too high or low. Check your blood sugar the morning of your surgery when you wake up and every 2 hours until you get to the Short Stay unit. If your blood sugar is less than 70 mg/dL, you will need to treat for low blood sugar: Do not take insulin . Treat a low blood sugar (less than 70 mg/dL) with  cup of clear juice (cranberry or apple), 4 glucose tablets, OR glucose gel. Recheck blood sugar in 15 minutes after treatment (to make sure it is greater than 70 mg/dL). If your blood sugar is not greater than 70 mg/dL on recheck, call [phone] for further instructions. Report your blood sugar to the short stay nurse when you get to Short Stay.  If you are admitted to the hospital after surgery: Your blood sugar will be checked by the staff and you will probably be given insulin  after surgery (instead of oral diabetes medicines) to make sure you have good blood sugar levels. The goal for blood sugar control after surgery is 80-180 mg/dL.   WHAT DO I DO ABOUT MY DIABETES MEDICATION?  Do not take oral diabetes medicines (pills) the morning of surgery.  THE NIGHT BEFORE SURGERY, take  50% of your normal dose of Lantus     insulin .       THE MORNING OF SURGERY, take  0 units of   Lantus    insulin .  DO NOT TAKE THE FOLLOWING 7 DAYS PRIOR TO SURGERY: Ozempic, Wegovy, Rybelsus (Semaglutide), Byetta (exenatide), Bydureon (exenatide ER), Victoza, Saxenda (liraglutide), or Trulicity (dulaglutide) Mounjaro  (Tirzepatide) Adlyxin (Lixisenatide), Polyethylene Glycol Loxenatide.  If your CBG is greater than 220 mg/dL, you may take  of your sliding scale  (correction) dose of insulin .  [facility] - Preparing for Surgery Before surgery, you can play an important role.  Because skin is not sterile, your skin needs to be as free of germs as possible.  You can reduce the number of germs on your skin by washing with CHG (chlorahexidine gluconate) soap before surgery.  CHG is an antiseptic cleaner which kills germs and bonds with the skin to continue killing germs even after washing. Please DO NOT use if you have an allergy to CHG or antibacterial soaps.  If your skin becomes reddened/irritated stop using the CHG and inform your nurse when you arrive at Short Stay. Do not shave (including legs and underarms) for at least 48 hours prior to the first CHG shower.  You may shave your face/neck. Please follow these instructions carefully:  1.  Shower with CHG Soap the night before surgery and the  morning of Surgery.  2.  If you choose to wash your hair, wash your hair first as usual with your  normal  shampoo.  3.  After you shampoo, rinse your hair and body thoroughly to remove the  shampoo.                            4.  Use CHG as you would any other liquid soap.  You can apply chg directly  to the skin and wash                       Gently with a scrungie or clean washcloth.  5.  Apply the CHG Soap to your body ONLY FROM THE NECK DOWN.   Do not use on face/ open                           Wound or open sores. Avoid contact with eyes, ears mouth and genitals (private parts).                       Wash face,  Genitals (private parts) with your normal soap.             6.  Wash thoroughly, paying special attention to the area where your surgery  will be performed.  7.  Thoroughly rinse your body  with warm water  from the neck down.  8.  DO NOT shower/wash with your normal soap after using and rinsing off  the CHG Soap.                9.  Pat yourself dry with a clean towel.            10.  Wear clean pajamas.            11.  Place clean sheets on your bed the night of your first shower and do not  sleep with pets. Day of Surgery : Do not apply any lotions/deodorants the morning of surgery.  Please wear clean clothes to the hospital/surgery center.  FAILURE TO FOLLOW THESE INSTRUCTIONS MAY RESULT IN THE CANCELLATION OF YOUR SURGERY PATIENT SIGNATURE_________________________________  NURSE SIGNATURE__________________________________  ________________________________________________________________________

## 2024-04-17 ENCOUNTER — Other Ambulatory Visit: Payer: Self-pay

## 2024-04-17 ENCOUNTER — Encounter (HOSPITAL_COMMUNITY)
Admission: RE | Admit: 2024-04-17 | Discharge: 2024-04-17 | Disposition: A | Discharge status: Home / Self Care | Source: Ambulatory Visit | Attending: Urology | Admitting: Urology

## 2024-04-17 ENCOUNTER — Encounter (HOSPITAL_COMMUNITY): Payer: Self-pay

## 2024-04-17 VITALS — BP 119/72 | HR 93 | Temp 98.5°F | Resp 16 | Ht 67.0 in | Wt 194.0 lb

## 2024-04-17 DIAGNOSIS — Z8542 Personal history of malignant neoplasm of other parts of uterus: Secondary | ICD-10-CM | POA: Insufficient documentation

## 2024-04-17 DIAGNOSIS — Z87891 Personal history of nicotine dependence: Secondary | ICD-10-CM | POA: Insufficient documentation

## 2024-04-17 DIAGNOSIS — E1151 Type 2 diabetes mellitus with diabetic peripheral angiopathy without gangrene: Secondary | ICD-10-CM | POA: Insufficient documentation

## 2024-04-17 DIAGNOSIS — J449 Chronic obstructive pulmonary disease, unspecified: Secondary | ICD-10-CM | POA: Insufficient documentation

## 2024-04-17 DIAGNOSIS — E1140 Type 2 diabetes mellitus with diabetic neuropathy, unspecified: Secondary | ICD-10-CM | POA: Diagnosis not present

## 2024-04-17 DIAGNOSIS — Z951 Presence of aortocoronary bypass graft: Secondary | ICD-10-CM | POA: Insufficient documentation

## 2024-04-17 DIAGNOSIS — F419 Anxiety disorder, unspecified: Secondary | ICD-10-CM | POA: Diagnosis not present

## 2024-04-17 DIAGNOSIS — Z01812 Encounter for preprocedural laboratory examination: Secondary | ICD-10-CM | POA: Diagnosis present

## 2024-04-17 DIAGNOSIS — I10 Essential (primary) hypertension: Secondary | ICD-10-CM | POA: Diagnosis not present

## 2024-04-17 DIAGNOSIS — E1165 Type 2 diabetes mellitus with hyperglycemia: Secondary | ICD-10-CM | POA: Diagnosis not present

## 2024-04-17 DIAGNOSIS — M797 Fibromyalgia: Secondary | ICD-10-CM | POA: Diagnosis not present

## 2024-04-17 DIAGNOSIS — F1120 Opioid dependence, uncomplicated: Secondary | ICD-10-CM | POA: Insufficient documentation

## 2024-04-17 DIAGNOSIS — G894 Chronic pain syndrome: Secondary | ICD-10-CM | POA: Insufficient documentation

## 2024-04-17 DIAGNOSIS — I2510 Atherosclerotic heart disease of native coronary artery without angina pectoris: Secondary | ICD-10-CM | POA: Insufficient documentation

## 2024-04-17 DIAGNOSIS — K219 Gastro-esophageal reflux disease without esophagitis: Secondary | ICD-10-CM | POA: Insufficient documentation

## 2024-04-17 DIAGNOSIS — C678 Malignant neoplasm of overlapping sites of bladder: Secondary | ICD-10-CM | POA: Insufficient documentation

## 2024-04-17 DIAGNOSIS — E039 Hypothyroidism, unspecified: Secondary | ICD-10-CM | POA: Diagnosis not present

## 2024-04-17 DIAGNOSIS — D649 Anemia, unspecified: Secondary | ICD-10-CM | POA: Diagnosis not present

## 2024-04-17 HISTORY — DX: Acute myocardial infarction, unspecified: I21.9

## 2024-04-17 LAB — CBC
HCT: 28.9 % — ABNORMAL LOW (ref 36.0–46.0)
Hemoglobin: 8.8 g/dL — ABNORMAL LOW (ref 12.0–15.0)
MCH: 29.3 pg (ref 26.0–34.0)
MCHC: 30.4 g/dL (ref 30.0–36.0)
MCV: 96.3 fL (ref 80.0–100.0)
Platelets: 191 K/uL (ref 150–400)
RBC: 3 MIL/uL — ABNORMAL LOW (ref 3.87–5.11)
RDW: 16.1 % — ABNORMAL HIGH (ref 11.5–15.5)
WBC: 6.9 K/uL (ref 4.0–10.5)
nRBC: 0 % (ref 0.0–0.2)

## 2024-04-17 LAB — BASIC METABOLIC PANEL WITH GFR
Anion gap: 12 (ref 5–15)
BUN: 29 mg/dL — ABNORMAL HIGH (ref 8–23)
CO2: 26 mmol/L (ref 22–32)
Calcium: 9.4 mg/dL (ref 8.9–10.3)
Chloride: 98 mmol/L (ref 98–111)
Creatinine, Ser: 1 mg/dL (ref 0.44–1.00)
GFR, Estimated: >60 mL/min (ref >60)
Glucose, Bld: 271 mg/dL — ABNORMAL HIGH (ref 70–99)
Potassium: 4.5 mmol/L (ref 3.5–5.1)
Sodium: 136 mmol/L (ref 135–145)

## 2024-04-17 LAB — HEMOGLOBIN A1C
Hgb A1c MFr Bld: 12 % — ABNORMAL HIGH (ref 4.8–5.6)
Mean Plasma Glucose: 297.7 mg/dL

## 2024-04-17 LAB — GLUCOSE, CAPILLARY: Glucose-Capillary: 310 mg/dL — ABNORMAL HIGH (ref 70–99)

## 2024-04-17 NOTE — Progress Notes (Addendum)
 Case: [id number] Date/Time: 04/18/24 1045   Procedures:      TURBT (TRANSURETHRAL RESECTION OF BLADDER TUMOR) (Bilateral)     CYSTOSCOPY, WITH RETROGRADE PYELOGRAM (Bilateral)   Anesthesia type: General   Diagnosis: Malignant neoplasm of overlapping sites of bladder (HCC) [C67.8]   Pre-op diagnosis: RECURRENT BLADDER CANCER   Location: [facility] / [facility]   Surgeons: Alvaro Ricardo KATHEE Mickey., MD       DISCUSSION: Diana Kennedy is a 68 yo female who presents to PAT prior to surgery above. PMH of former smoking, hx of multiple MIs, CAD s/p CABG x 4 (2019), PVD (carotid artery disease, R subclavian artery stenosis s/p bypass (2015), HTN, COPD, neuropathy, GERD, hypothyroid, fibromyalgia, anxiety, depression, chronic anemia, hx of endometrial cancer s/p hysterectomy and chemo/XRT, bladder cancer, chronic pain syndrome with narcotic dependence, s/p lumbar fusion L4-L5  Had TURBT in 12/2022, 02/2023, 07/2023.   Patient has extensive cardiac hx. Has not been seen by Cardiology since 2019 per chart review. She has a hx of multiple MIs since 2004 with 5-stents placed in 2009 by Dr. Maralee at [facility]. Then had CABG in 04/2018 at [facility]. Lost to f/u after that.  Patient established care with PCP at [facility]. Stress test ordered to check things out per patient. Office notes requested. She denies CP/SOB and reports going up a flight of stairs without difficulty.  Patient also has hx of IDDM which is uncontrolled. Glucose 310 at PAT visit.  Hx of vascular disease. She has mild-mod bilateral carotid disease and right subclavian artery stenosis. She underwent right carotid to subclavian artery bypass in 04/2014. Lost to follow up.  Discussed with Dr. Peggye. Since patient is asymptomatic and has adequate functional status, ok to proceed and eval DOS.  VS: BP 119/72   Pulse 93   Temp 36.9 C (Oral)   Resp 16   Ht 5' 7 (1.702 m)   Wt 88 kg   SpO2 97%   BMI 30.38 kg/m    PROVIDERS: Melba Lamarr BRAVO, NP   LABS: Labs reviewed: Acceptable for surgery. Anemia stable (all labs ordered are listed, but only abnormal results are displayed)  Labs Reviewed  GLUCOSE, CAPILLARY - Abnormal; Notable for the following components:      Result Value   Glucose-Capillary 310 (*)    All other components within normal limits  HEMOGLOBIN A1C  BASIC METABOLIC PANEL WITH GFR  CBC     IMAGES:   EKG:   CV:  TEE 04/28/2018 ([facility]):  Left ventricular systolic function is normal. Ejection Fraction = >55%. The transmitral spectral Doppler flow pattern is suggestive of pseudonormalization. The right ventricle is normal in size and function. The interatrial septum is intact with no evidence for an atrial septal defect. Chiari network (normal variant) is noted. The mitral valve is normal in structure and function. The tricuspid valve is normal in structure and function. There is trace tricuspid regurgitation. The aortic valve is normal in structure and function. Calcification of non-coronary cusp with normal valve opening. > The aortic root is normal size. Moderate atherosclerotic plaque(s) in the descending aorta. Severe atherosclerotic plaque(s) in the aortic arch. Small  pericardial effusion. There is no pleural effusion.  LHC 04/22/2018 ([facility]):    Multi-vessel coronary artery disease.  Refer to cardiothoracic surgeon for opinion and possible surgery.  CP via right radial for positive stress test.  Prox LAD with obstructive ISR, borderline mid LAD, high grade D2.  Prox Cx with high grade disease.  Mid RCA CTO with L-R and R-R collaterals.  EBL < 5 cc, prelude sync for hemostasis, no specimens.  Coronary Findings Diagnostic Dominance: Right Left Main: LM lesion, 30% stenosed. Left Anterior Descending: Mid LAD lesion, 70% stenosed. The lesion was previously treated using a stent (unknown type). Previous stent with restenosis. Second Diagonal Branch:  Ost 2nd Diag to 2nd Diag lesion, 95% stenosed. Left Circumflex: Mid Cx lesion, 80% stenosed. Right Coronary Artery: Prox RCA lesion, 70% stenosed. Mid RCA lesion, 100% stenosed. Acute Marginal Branch: Acute Mrg filled by collaterals from Acute Mrg. Right Posterior Descending Artery: RPDA filled by collaterals from Dist LAD. Third Right Posterolateral Branch: 3rd RPL filled by collaterals from Dist Cx.      Past Medical History:  Diagnosis Date   Anemia    Anxiety    Bladder cancer (HCC)    Carotid artery disease (HCC)    Coronary artery disease    Depression    Diabetes mellitus without complication Type 2    Endometrial cancer (HCC) 2019   Family history of coronary artery disease    Fibromyalgia    Full dentures    GERD (gastroesophageal reflux disease)    History of kidney stones    Hyperlipidemia    Hypertension    Hypothyroidism    Peripheral neuropathy    legs and feet   Subclavian artery stenosis, right [facility])     Past Surgical History:  Procedure Laterality Date   ABDOMINAL HYSTERECTOMY  2019   BACK SURGERY  09/27/2008   lower   CAROTID-SUBCLAVIAN BYPASS GRAFT Right 05/09/2014   Procedure: Right Carotid Subclavian bypass with 7 mm hemashield graft.;  Surgeon: Gaile LELON New, MD;  Location: [facility];  Service: Vascular;  Laterality: Right;   CORONARY ANGIOPLASTY WITH STENT PLACEMENT     5 vessel 2004, 2008?   CORONARY ARTERY BYPASS GRAFT  2019   quadruple   CYSTOSCOPY W/ RETROGRADES Bilateral 08/26/2023   Procedure: CYSTOSCOPY WITH RETROGRADE PYELOGRAM;  Surgeon: Roseann Adine PARAS., MD;  [facility];  Service: Urology;  Laterality: Bilateral;   CYSTOSCOPY W/ URETERAL STENT PLACEMENT  01/14/2023   Procedure: CYSTOSCOPY WITH BILATERAL RETROGRADE PYELOGRAM/ RIGHT URETERAL STENT PLACEMENT;  Surgeon: Alvaro Ricardo KATHEE Mickey., MD;  [facility];  Service: Urology;;   PHYLLIS WITH FULGERATION N/A 08/26/2023   Procedure: PHYLLIS WITH FULGERATION;  Surgeon:  Roseann Adine PARAS., MD;  [facility];  Service: Urology;  Laterality: N/A;   CYSTOSCOPY WITH RETROGRADE PYELOGRAM, URETEROSCOPY AND STENT PLACEMENT Right 03/11/2023   Procedure: CYSTOSCOPY WITH RETROGRADE PYELOGRAM, URETEROSCOPY AND STENT EXCHANGE;  Surgeon: Alvaro Ricardo KATHEE Mickey., MD;  [facility];  Service: Urology;  Laterality: Right;   HOLMIUM LASER APPLICATION Right 03/11/2023   Procedure: HOLMIUM LASER APPLICATION;  Surgeon: Alvaro Ricardo KATHEE Mickey., MD;  [facility];  Service: Urology;  Laterality: Right;   LEFT HEART CATHETERIZATION WITH CORONARY ANGIOGRAM N/A 04/08/2014   Procedure: LEFT HEART CATHETERIZATION WITH CORONARY ANGIOGRAM;  Surgeon: Dorn PARAS Lesches, MD;  Location: [facility] CATH LAB;  Service: Cardiovascular;  Laterality: N/A;   TRANSURETHRAL RESECTION OF BLADDER TUMOR N/A 01/14/2023   Procedure: TRANSURETHRAL RESECTION OF BLADDER TUMOR (TURBT);  Surgeon: Alvaro Ricardo KATHEE Mickey., MD;  [facility];  Service: Urology;  Laterality: N/A;  45 MINS   TRANSURETHRAL RESECTION OF BLADDER TUMOR N/A 03/11/2023   Procedure: TRANSURETHRAL RESECTION OF BLADDER TUMOR (TURBT);  Surgeon: Alvaro Ricardo KATHEE Mickey., MD;  [facility];  Service: Urology;  Laterality: N/A;  TRANSURETHRAL RESECTION OF BLADDER TUMOR N/A 08/26/2023   Procedure: TRANSURETHRAL RESECTION OF BLADDER TUMOR (TURBT);  Surgeon: Roseann Adine PARAS., MD;  [facility];  Service: Urology;  Laterality: N/A;   US  ECHOCARDIOGRAPHY  12/30/2011   mild LVH, mild AOV sclerosis,trace MR,TR,trace/mild physiologic PI    MEDICATIONS:  Blood Glucose Monitoring Suppl DEVI   glipiZIDE  (GLUCOTROL ) 5 MG tablet   Glucose Blood (BLOOD GLUCOSE TEST STRIPS) STRP   hydrOXYzine  (ATARAX ) 25 MG tablet   Lancet Device MISC   Lancets MISC   LANTUS  SOLOSTAR 100 UNIT/ML Solostar Pen   levothyroxine  (SYNTHROID ) 150 MCG tablet   naloxone (NARCAN) nasal spray 4 mg/0.1 mL   NOVOLOG  FLEXPEN 100 UNIT/ML FlexPen   omeprazole (PRILOSEC) 40  MG capsule   oxyCODONE  (ROXICODONE ) 15 MG immediate release tablet   polyethylene glycol (MIRALAX  / GLYCOLAX ) 17 g packet   pregabalin  (LYRICA ) 200 MG capsule   XTAMPZA  ER 13.5 MG C12A   No current facility-administered medications for this encounter.   Burnard CHRISTELLA Odis DEVONNA [facility] Short Stay/Anesthesiology [facility] Phone [phone] 04/17/2024 3:58 PM

## 2024-04-18 ENCOUNTER — Encounter (HOSPITAL_COMMUNITY): Payer: Self-pay | Admitting: Medical

## 2024-04-18 ENCOUNTER — Ambulatory Visit (HOSPITAL_COMMUNITY)

## 2024-04-18 ENCOUNTER — Encounter (HOSPITAL_COMMUNITY): Payer: Self-pay | Admitting: Urology

## 2024-04-18 ENCOUNTER — Ambulatory Visit (HOSPITAL_COMMUNITY)
Admission: RE | Admit: 2024-04-18 | Discharge: 2024-04-18 | Disposition: A | Discharge status: Home / Self Care | Attending: Urology | Admitting: Urology

## 2024-04-18 ENCOUNTER — Ambulatory Visit (HOSPITAL_BASED_OUTPATIENT_CLINIC_OR_DEPARTMENT_OTHER): Payer: Self-pay | Admitting: Anesthesiology

## 2024-04-18 ENCOUNTER — Encounter (HOSPITAL_COMMUNITY)
Admission: RE | Disposition: A | Discharge status: Home / Self Care | Payer: Self-pay | Source: Home / Self Care | Attending: Urology

## 2024-04-18 DIAGNOSIS — K219 Gastro-esophageal reflux disease without esophagitis: Secondary | ICD-10-CM | POA: Insufficient documentation

## 2024-04-18 DIAGNOSIS — M797 Fibromyalgia: Secondary | ICD-10-CM | POA: Insufficient documentation

## 2024-04-18 DIAGNOSIS — Z87891 Personal history of nicotine dependence: Secondary | ICD-10-CM | POA: Insufficient documentation

## 2024-04-18 DIAGNOSIS — E039 Hypothyroidism, unspecified: Secondary | ICD-10-CM | POA: Diagnosis not present

## 2024-04-18 DIAGNOSIS — I252 Old myocardial infarction: Secondary | ICD-10-CM

## 2024-04-18 DIAGNOSIS — C679 Malignant neoplasm of bladder, unspecified: Secondary | ICD-10-CM

## 2024-04-18 DIAGNOSIS — Z9221 Personal history of antineoplastic chemotherapy: Secondary | ICD-10-CM | POA: Diagnosis not present

## 2024-04-18 DIAGNOSIS — E785 Hyperlipidemia, unspecified: Secondary | ICD-10-CM | POA: Diagnosis not present

## 2024-04-18 DIAGNOSIS — E119 Type 2 diabetes mellitus without complications: Secondary | ICD-10-CM | POA: Insufficient documentation

## 2024-04-18 DIAGNOSIS — I25118 Atherosclerotic heart disease of native coronary artery with other forms of angina pectoris: Secondary | ICD-10-CM

## 2024-04-18 DIAGNOSIS — Z923 Personal history of irradiation: Secondary | ICD-10-CM | POA: Insufficient documentation

## 2024-04-18 DIAGNOSIS — I10 Essential (primary) hypertension: Secondary | ICD-10-CM | POA: Diagnosis not present

## 2024-04-18 DIAGNOSIS — I2510 Atherosclerotic heart disease of native coronary artery without angina pectoris: Secondary | ICD-10-CM | POA: Insufficient documentation

## 2024-04-18 DIAGNOSIS — C678 Malignant neoplasm of overlapping sites of bladder: Secondary | ICD-10-CM | POA: Insufficient documentation

## 2024-04-18 DIAGNOSIS — Z8542 Personal history of malignant neoplasm of other parts of uterus: Secondary | ICD-10-CM | POA: Insufficient documentation

## 2024-04-18 DIAGNOSIS — Z951 Presence of aortocoronary bypass graft: Secondary | ICD-10-CM | POA: Diagnosis not present

## 2024-04-18 DIAGNOSIS — E1165 Type 2 diabetes mellitus with hyperglycemia: Secondary | ICD-10-CM

## 2024-04-18 HISTORY — PX: TRANSURETHRAL RESECTION OF BLADDER TUMOR: SHX2575

## 2024-04-18 HISTORY — PX: CYSTOSCOPY W/ RETROGRADES: SHX1426

## 2024-04-18 LAB — GLUCOSE, CAPILLARY
Glucose-Capillary: 272 mg/dL — ABNORMAL HIGH (ref 70–99)
Glucose-Capillary: 183 mg/dL — ABNORMAL HIGH (ref 70–99)

## 2024-04-18 SURGERY — TURBT (TRANSURETHRAL RESECTION OF BLADDER TUMOR)
Anesthesia: General | Laterality: Bilateral

## 2024-04-18 MED ORDER — ONDANSETRON HCL 4 MG/2ML IJ SOLN
INTRAMUSCULAR | Status: DC | PRN
  Administered 2024-04-18: 4 mg via INTRAVENOUS

## 2024-04-18 MED ORDER — MEPERIDINE HCL 25 MG/ML IJ SOLN: 6.2500 mg | INTRAMUSCULAR | Status: DC | PRN

## 2024-04-18 MED ORDER — MIDAZOLAM HCL 2 MG/2ML IJ SOLN
INTRAMUSCULAR | Status: AC
  Filled 2024-04-18: qty 2

## 2024-04-18 MED ORDER — IOHEXOL 300 MG/ML  SOLN
INTRAMUSCULAR | Status: DC | PRN
  Administered 2024-04-18: 18 mL via URETHRAL

## 2024-04-18 MED ORDER — PHENYLEPHRINE 80 MCG/ML (10ML) SYRINGE FOR IV PUSH (FOR BLOOD PRESSURE SUPPORT)
PREFILLED_SYRINGE | INTRAVENOUS | Status: DC | PRN
Start: 2024-04-18 — End: 2024-04-18
  Administered 2024-04-18: 160 ug via INTRAVENOUS

## 2024-04-18 MED ORDER — DEXAMETHASONE SODIUM PHOSPHATE 10 MG/ML IJ SOLN
INTRAMUSCULAR | Status: AC
  Filled 2024-04-18: qty 1

## 2024-04-18 MED ORDER — SUGAMMADEX SODIUM 200 MG/2ML IV SOLN
INTRAVENOUS | Status: DC | PRN
  Administered 2024-04-18: 350 mg via INTRAVENOUS

## 2024-04-18 MED ORDER — LIDOCAINE HCL (PF) 2 % IJ SOLN
INTRAMUSCULAR | Status: AC
Start: 2024-04-18 — End: 2024-04-18
  Filled 2024-04-18: qty 5

## 2024-04-18 MED ORDER — FENTANYL CITRATE (PF) 250 MCG/5ML IJ SOLN
INTRAMUSCULAR | Status: DC | PRN
  Administered 2024-04-18 (×2): 50 ug via INTRAVENOUS

## 2024-04-18 MED ORDER — FENTANYL CITRATE PF 50 MCG/ML IJ SOSY
PREFILLED_SYRINGE | INTRAMUSCULAR | Status: AC
Start: 2024-04-18 — End: 2024-04-18
  Filled 2024-04-18: qty 2

## 2024-04-18 MED ORDER — OXYCODONE HCL 5 MG/5ML PO SOLN: 5.0000 mg | Freq: Once | ORAL | Status: AC | PRN

## 2024-04-18 MED ORDER — OXYCODONE HCL 5 MG PO TABS
5.0000 mg | ORAL_TABLET | Freq: Once | ORAL | Status: AC | PRN
  Administered 2024-04-18: 5 mg via ORAL

## 2024-04-18 MED ORDER — FENTANYL CITRATE PF 50 MCG/ML IJ SOSY
PREFILLED_SYRINGE | INTRAMUSCULAR | Status: AC
Start: 2024-04-18 — End: 2024-04-18
  Filled 2024-04-18: qty 1

## 2024-04-18 MED ORDER — ROCURONIUM BROMIDE 10 MG/ML (PF) SYRINGE
PREFILLED_SYRINGE | INTRAVENOUS | Status: AC
Start: 2024-04-18 — End: 2024-04-18
  Filled 2024-04-18: qty 10

## 2024-04-18 MED ORDER — GENTAMICIN SULFATE 40 MG/ML IJ SOLN
440.0000 mg | INTRAVENOUS | Status: AC
  Administered 2024-04-18: 440 mg via INTRAVENOUS
  Filled 2024-04-18: qty 11

## 2024-04-18 MED ORDER — ACETAMINOPHEN 500 MG PO TABS
1000.0000 mg | ORAL_TABLET | Freq: Once | ORAL | Status: AC
  Administered 2024-04-18: 1000 mg via ORAL
  Filled 2024-04-18: qty 2

## 2024-04-18 MED ORDER — ONDANSETRON HCL 4 MG/2ML IJ SOLN
INTRAMUSCULAR | Status: AC
Start: 2024-04-18 — End: 2024-04-18
  Filled 2024-04-18: qty 2

## 2024-04-18 MED ORDER — PHENYLEPHRINE 80 MCG/ML (10ML) SYRINGE FOR IV PUSH (FOR BLOOD PRESSURE SUPPORT)
PREFILLED_SYRINGE | INTRAVENOUS | Status: AC
Start: 2024-04-18 — End: 2024-04-18
  Filled 2024-04-18: qty 10

## 2024-04-18 MED ORDER — MIDAZOLAM HCL 2 MG/2ML IJ SOLN
INTRAMUSCULAR | Status: DC | PRN
  Administered 2024-04-18: 2 mg via INTRAVENOUS

## 2024-04-18 MED ORDER — OXYCODONE HCL 5 MG PO TABS
ORAL_TABLET | ORAL | Status: AC
  Filled 2024-04-18: qty 1

## 2024-04-18 MED ORDER — CHLORHEXIDINE GLUCONATE 0.12 % MT SOLN
15.0000 mL | Freq: Once | OROMUCOSAL | Status: AC
  Administered 2024-04-18: 15 mL via OROMUCOSAL

## 2024-04-18 MED ORDER — PROPOFOL 10 MG/ML IV BOLUS
INTRAVENOUS | Status: AC
  Filled 2024-04-18: qty 20

## 2024-04-18 MED ORDER — SODIUM CHLORIDE 0.9 % IR SOLN
Status: DC | PRN
  Administered 2024-04-18: 6000 mL via INTRAVESICAL

## 2024-04-18 MED ORDER — LACTATED RINGERS IV SOLN: INTRAVENOUS | Status: DC

## 2024-04-18 MED ORDER — INSULIN ASPART 100 UNIT/ML IJ SOLN
0.0000 [IU] | INTRAMUSCULAR | Status: DC | PRN
  Administered 2024-04-18: 8 [IU] via SUBCUTANEOUS
  Filled 2024-04-18: qty 1

## 2024-04-18 MED ORDER — SUGAMMADEX SODIUM 200 MG/2ML IV SOLN
INTRAVENOUS | Status: AC
  Filled 2024-04-18: qty 4

## 2024-04-18 MED ORDER — CELECOXIB 200 MG PO CAPS
200.0000 mg | ORAL_CAPSULE | Freq: Once | ORAL | Status: AC
  Administered 2024-04-18: 200 mg via ORAL
  Filled 2024-04-18: qty 1

## 2024-04-18 MED ORDER — ROCURONIUM BROMIDE 10 MG/ML (PF) SYRINGE
PREFILLED_SYRINGE | INTRAVENOUS | Status: DC | PRN
  Administered 2024-04-18: 50 mg via INTRAVENOUS
  Administered 2024-04-18: 10 mg via INTRAVENOUS

## 2024-04-18 MED ORDER — FENTANYL CITRATE (PF) 100 MCG/2ML IJ SOLN
INTRAMUSCULAR | Status: AC
  Filled 2024-04-18: qty 2

## 2024-04-18 MED ORDER — ORAL CARE MOUTH RINSE: 15.0000 mL | Freq: Once | OROMUCOSAL | Status: AC

## 2024-04-18 MED ORDER — FENTANYL CITRATE PF 50 MCG/ML IJ SOSY
25.0000 ug | PREFILLED_SYRINGE | INTRAMUSCULAR | Status: DC | PRN
  Administered 2024-04-18 (×3): 50 ug via INTRAVENOUS

## 2024-04-18 MED ORDER — LACTATED RINGERS IV SOLN
INTRAVENOUS | Status: DC | PRN
Start: 2024-04-18 — End: 2024-04-18

## 2024-04-18 MED ORDER — PROPOFOL 10 MG/ML IV BOLUS
INTRAVENOUS | Status: DC | PRN
  Administered 2024-04-18: 50 mg via INTRAVENOUS
  Administered 2024-04-18: 150 mg via INTRAVENOUS

## 2024-04-18 SURGICAL SUPPLY — 17 items
BAG URINE DRAIN 2000ML AR STRL (UROLOGICAL SUPPLIES) IMPLANT
BAG URO CATCHER STRL LF (MISCELLANEOUS) ×1 IMPLANT
CATH URETL OPEN END 6FR 70 (CATHETERS) IMPLANT
CLOTH BEACON ORANGE TIMEOUT ST (SAFETY) ×1 IMPLANT
DRAPE FOOT SWITCH (DRAPES) ×1 IMPLANT
ELECT REM PT RETURN 15FT ADLT (MISCELLANEOUS) ×1 IMPLANT
GLOVE SURG LX STRL 7.5 STRW (GLOVE) ×1 IMPLANT
GOWN STRL REUS W/ TWL XL LVL3 (GOWN DISPOSABLE) ×1 IMPLANT
GUIDEWIRE STR DUAL SENSOR (WIRE) ×1 IMPLANT
KIT TURNOVER KIT A (KITS) ×1 IMPLANT
LOOP CUT BIPOLAR 24F LRG (ELECTROSURGICAL) IMPLANT
MANIFOLD NEPTUNE II (INSTRUMENTS) ×1 IMPLANT
NS IRRIG 1000ML POUR BTL (IV SOLUTION) IMPLANT
PACK CYSTO (CUSTOM PROCEDURE TRAY) ×1 IMPLANT
SYRINGE TOOMEY IRRIG 70ML (MISCELLANEOUS) IMPLANT
TUBING CONNECTING 10 (TUBING) ×1 IMPLANT
TUBING UROLOGY SET (TUBING) ×1 IMPLANT

## 2024-04-18 NOTE — H&P (Signed)
 Diana Kennedy is an 68 y.o. female.    Chief Complaint: Pre-Op Transurethral Resection of Bladder Tumor / Retrogrades  HPI:   1 - High Grade Superficial Bladder Cancer - small T1G3 tumor by urgent TURBT 12/2022 on eval hemtauria with hemodynaimcly significant bleeding. She has h/o metastaic endometrial cancer with extensive pelvic radiation. CT 12/2022 localized.   Recent Course:  02/2023 - restsging TURBT - no residual / recurrent disease ==> BCG Induction x 6  11/2023 - cysto - 3mm dome area subtle papillary change, posteior fibrinous tissue old resection site; 03/2024 cysto about 3cm done recurrence.   2 - Right Renal Stone - s/p ureteroscopy 02/2023 for 12mm Rt renal stone sto stone free (was incidetnal on imaging x several). COmposition 80% Urate / 20% CaOx.   PMH sig for metastatic / recurrent endometrial cancer (TAH,BSO, chemo, XRT, then vaginal cuff brachy for cuff recurrence managed by Cathlyn Sor MD at [facility]), L spine fusion, obestiy, DM2 (A1c 7s), CAD/Stent, Carotid bypass.   Today Diana Kennedy is seen for TURBT / retrogrades for recurrent / progresive bladder cancer. Sugars remain high with A1c 12, but she has significan ttumor with anemia that needs to be addressed. Most recetn UCx with come coliform colonization pan-sensitive. Cr 1, Hgb 8.8.     Past Medical History:  Diagnosis Date   Anemia    Anxiety    Bladder cancer (HCC)    Carotid artery disease (HCC)    Coronary artery disease    Depression    Diabetes mellitus without complication Type 2    Endometrial cancer (HCC) 2019   Family history of coronary artery disease    Fibromyalgia    Full dentures    GERD (gastroesophageal reflux disease)    History of kidney stones    Hyperlipidemia    Hypertension    Hypothyroidism    Myocardial infarction [facility])    Peripheral neuropathy    legs and feet   Subclavian artery stenosis, right [facility])     Past Surgical History:  Procedure Laterality Date   ABDOMINAL HYSTERECTOMY   2019   BACK SURGERY  09/27/2008   lower   CAROTID-SUBCLAVIAN BYPASS GRAFT Right 05/09/2014   Procedure: Right Carotid Subclavian bypass with 7 mm hemashield graft.;  Surgeon: Gaile LELON New, MD;  [facility];  Service: Vascular;  Laterality: Right;   CORONARY ANGIOPLASTY WITH STENT PLACEMENT     5 vessel 2004, 2008?   CORONARY ARTERY BYPASS GRAFT  2019   quadruple   CYSTOSCOPY W/ RETROGRADES Bilateral 08/26/2023   Procedure: CYSTOSCOPY WITH RETROGRADE PYELOGRAM;  Surgeon: Roseann Adine JINNY., MD;  [facility];  Service: Urology;  Laterality: Bilateral;   CYSTOSCOPY W/ URETERAL STENT PLACEMENT  01/14/2023   Procedure: CYSTOSCOPY WITH BILATERAL RETROGRADE PYELOGRAM/ RIGHT URETERAL STENT PLACEMENT;  Surgeon: Alvaro Ricardo KATHEE Mickey., MD;  [facility];  Service: Urology;;   PHYLLIS WITH FULGERATION N/A 08/26/2023   Procedure: PHYLLIS WITH FULGERATION;  Surgeon: Roseann Adine JINNY., MD;  [facility];  Service: Urology;  Laterality: N/A;   CYSTOSCOPY WITH RETROGRADE PYELOGRAM, URETEROSCOPY AND STENT PLACEMENT Right 03/11/2023   Procedure: CYSTOSCOPY WITH RETROGRADE PYELOGRAM, URETEROSCOPY AND STENT EXCHANGE;  Surgeon: Alvaro Ricardo KATHEE Mickey., MD;  [facility];  Service: Urology;  Laterality: Right;   HOLMIUM LASER APPLICATION Right 03/11/2023   Procedure: HOLMIUM LASER APPLICATION;  Surgeon: Alvaro Ricardo KATHEE Mickey., MD;  [facility];  Service: Urology;  Laterality: Right;   LEFT HEART CATHETERIZATION WITH CORONARY ANGIOGRAM  N/A 04/08/2014   Procedure: LEFT HEART CATHETERIZATION WITH CORONARY ANGIOGRAM;  Surgeon: Dorn JINNY Lesches, MD;  Location: [facility] CATH LAB;  Service: Cardiovascular;  Laterality: N/A;   TRANSURETHRAL RESECTION OF BLADDER TUMOR N/A 01/14/2023   Procedure: TRANSURETHRAL RESECTION OF BLADDER TUMOR (TURBT);  Surgeon: Alvaro Ricardo KATHEE Mickey., MD;  [facility];  Service: Urology;  Laterality: N/A;  45 MINS   TRANSURETHRAL RESECTION OF BLADDER TUMOR N/A  03/11/2023   Procedure: TRANSURETHRAL RESECTION OF BLADDER TUMOR (TURBT);  Surgeon: Alvaro Ricardo KATHEE Mickey., MD;  [facility];  Service: Urology;  Laterality: N/A;   TRANSURETHRAL RESECTION OF BLADDER TUMOR N/A 08/26/2023   Procedure: TRANSURETHRAL RESECTION OF BLADDER TUMOR (TURBT);  Surgeon: Roseann Adine JINNY., MD;  [facility];  Service: Urology;  Laterality: N/A;   US  ECHOCARDIOGRAPHY  12/30/2011   mild LVH, mild AOV sclerosis,trace MR,TR,trace/mild physiologic PI    Family History  Problem Relation Age of Onset   Cancer Father        lung   Heart attack Father    Social History:  reports that she quit smoking about 4 years ago. Her smoking use included cigarettes. She started smoking about 24 years ago. She has a 2 pack-year smoking history. She has never used smokeless tobacco. She reports that she does not drink alcohol and does not use drugs.  Allergies: No Known Allergies  No medications prior to admission.    Results for orders placed or performed during the hospital encounter of 04/17/24 (from the past 48 hours)  Glucose, capillary     Status: Abnormal   Collection Time: 04/17/24  1:21 PM  Result Value Ref Range   Glucose-Capillary 310 (H) 70 - 99 mg/dL    Comment: Glucose reference range applies only to samples taken after fasting for at least 8 hours.  Hemoglobin A1c per protocol     Status: Abnormal   Collection Time: 04/17/24  1:30 PM  Result Value Ref Range   Hgb A1c MFr Bld 12.0 (H) 4.8 - 5.6 %    Comment: (NOTE) Diagnosis of Diabetes The following HbA1c ranges recommended by the American Diabetes Association (ADA) may be used as an aid in the diagnosis of diabetes mellitus.  Hemoglobin             Suggested A1C NGSP%              Diagnosis  <5.7                   Non Diabetic  5.7-6.4                Pre-Diabetic  >6.4                   Diabetic  <7.0                   Glycemic control for                       adults with diabetes.      Mean Plasma Glucose 297.7 mg/dL    Comment: Performed at [facility], [address], [place], [place] [address]  Basic metabolic panel per protocol     Status: Abnormal   Collection Time: 04/17/24  1:30 PM  Result Value Ref Range   Sodium 136 135 - 145 mmol/L   Potassium 4.5 3.5 - 5.1 mmol/L   Chloride 98 98 - 111 mmol/L   CO2  26 22 - 32 mmol/L   Glucose, Bld 271 (H) 70 - 99 mg/dL    Comment: Glucose reference range applies only to samples taken after fasting for at least 8 hours.   BUN 29 (H) 8 - 23 mg/dL   Creatinine, Ser 8.99 0.44 - 1.00 mg/dL   Calcium 9.4 8.9 - 89.6 mg/dL   GFR, Estimated >39 >39 mL/min    Comment: (NOTE) Calculated using the CKD-EPI Creatinine Equation (2021)    Anion gap 12 5 - 15    Comment: Performed at [facility], [address], [place], [place] [address]  CBC per protocol     Status: Abnormal   Collection Time: 04/17/24  1:30 PM  Result Value Ref Range   WBC 6.9 4.0 - 10.5 K/uL   RBC 3.00 (L) 3.87 - 5.11 MIL/uL   Hemoglobin 8.8 (L) 12.0 - 15.0 g/dL   HCT 71.0 (L) 63.9 - 53.9 %   MCV 96.3 80.0 - 100.0 fL   MCH 29.3 26.0 - 34.0 pg   MCHC 30.4 30.0 - 36.0 g/dL   RDW 83.8 (H) 88.4 - 84.4 %   Platelets 191 150 - 400 K/uL   nRBC 0.0 0.0 - 0.2 %    Comment: Performed at [facility], [address] [place], [place] [address]   No results found.  Review of Systems  Constitutional:  Negative for chills.  Genitourinary:  Positive for hematuria.  All other systems reviewed and are negative.   There were no vitals taken for this visit. Physical Exam Vitals reviewed.  HENT:     Head: Normocephalic.  Eyes:     Pupils: Pupils are equal, round, and reactive to light.  Cardiovascular:     Rate and Rhythm: Normal rate.  Pulmonary:     Effort: Pulmonary effort is normal.  Abdominal:     Comments: Stable large truncal obesity  Genitourinary:    Comments: No CVAT at present Musculoskeletal:         General: Normal range of motion.     Cervical back: Normal range of motion.  Neurological:     General: No focal deficit present.     Mental Status: She is alert.  Psychiatric:        Mood and Affect: Mood normal.      Assessment/Plan  Proceed as planned with TURBT / retrogrades. Risks (including infection with her poorly controlled diabetes), benefits, alternatives, expected peri-op course discussed previously and reiterated today.   Ricardo KATHEE Alvaro Mickey., MD 04/18/2024, 7:03 AM

## 2024-04-18 NOTE — Discharge Instructions (Signed)
1 - You may have urinary urgency (bladder spasms) and bloody urine on / off for up to 2 weeks.  This is normal.  2 - Call MD or go to ER for fever >102, severe pain / nausea / vomiting not relieved by medications, or acute change in medical status  

## 2024-04-18 NOTE — Anesthesia Procedure Notes (Signed)
 Procedure Name: Intubation Date/Time: 04/18/2024 12:35 PM  Performed by: Obadiah Reyes BROCKS, CRNAPre-anesthesia Checklist: Patient identified, Emergency Drugs available, Suction available and Patient being monitored Patient Re-evaluated:Patient Re-evaluated prior to induction Oxygen Delivery Method: Circle System Utilized Preoxygenation: Pre-oxygenation with 100% oxygen Induction Type: IV induction Ventilation: Mask ventilation without difficulty Laryngoscope Size: Mac and 3 Grade View: Grade II Tube type: Oral Tube size: 7.0 mm Number of attempts: 1 Airway Equipment and Method: Stylet and Oral airway Placement Confirmation: ETT inserted through vocal cords under direct vision, positive ETCO2 and breath sounds checked- equal and bilateral Secured at: 21 cm Tube secured with: Tape Dental Injury: Teeth and Oropharynx as per pre-operative assessment

## 2024-04-18 NOTE — Op Note (Signed)
 NAME: Diana, Kennedy MEDICAL RECORD NO: [id number] ACCOUNT NO: [id number] DATE OF BIRTH: 1955-11-13 FACILITY: [facility] LOCATION: [facility] PHYSICIAN: Ricardo Likens, MD  Operative Report   DATE OF PROCEDURE: 04/18/2024  PREOPERATIVE DIAGNOSES: 1.  Recurrent bladder cancer. 2.  Hematuria. 3.  History of metastatic endometrial cancer.  PROCEDURES PERFORMED: 1.  Cystoscopy, transurethral resection of bladder tumor volume medium. 2.  Bilateral retrograde pyelograms with interpretation.  ESTIMATED BLOOD LOSS:  Nil.  COMPLICATIONS:  None.  SPECIMENS: 1.  Bladder tumor. 2.  Base of the bladder tumor.  FINDINGS:  1.  Approximately 4 cm squared recurrence of bladder tumor across dome and right lateral areas. 2.  Unremarkable bilateral retrograde pyelograms.  PREOPERATIVE INDICATIONS:  The patient is a pleasant but unfortunate 68 year old lady with a history of metastatic endometrial cancer.  She is managed by Medical Oncology at [facility].  She does have a history of extensive pelvic radiation and a  smoking history.  She has a known history of recurrent bladder cancer and was found on workup of gross hematuria to have a significant recurrence, and she presents for transurethral resection today.  Her glycemic control has been non-optimal, and she has  recently started on more aggressive therapy.  However, given her symptomatic nature of the tumor, it was clearly felt that proceeding with it was still warranted.  Informed consent was obtained and placed in the medical record.  DESCRIPTION OF PROCEDURE:  The patient being Diana Kennedy was verified and the procedure being cystoscopy, transurethral resection of bladder tumor was confirmed.  Procedure timeout was performed.  Intravenous antibiotics administered.  General anesthesia  induced.  The patient was placed into a low lithotomy position.  Sterile field was created, prepping and draping the patient's vagina, introitus and proximal thighs using  iodine.  Cystourethroscopy performed using a 21-French rigid cystoscope with offset  lens.  Inspection of the bladder revealed multifocal foci of papillary bladder tumor, focused at the dome, approximately 1.5 cm, and another in the right lateral area, an additional 2.5 cm.  The right ureter was cannulated with 6-French open-ended  catheter and a right retrograde pyelogram were obtained.   Right retrograde pyelogram demonstrated a single right ureter and single system right kidney.  No filling defects were noted.  Next, a left retrograde pyelogram was obtained.   Left retrograde pyelogram demonstrated a single left ureter and single system left kidney.  There was mild dilation of the ureter without hydronephrosis.  There was a prominent notch as it crossed over the iliac vessels, but no obvious filling defects.   The cystoscope was exchanged with a 26-French resectoscope sheath with visual obturator.  Using a resectoscope loop, very careful dissection was performed of the dome tumor, taking exquisite care to avoid perforation, and then of the right lateral tumor,  which did appear slightly more nodular and high-grade.  Bladder tumor fragments were irrigated and set aside for pathologic analysis.  Next, cold cup biopsies were  obtained from the  seromuscular deep bites of the resection site, the base of the  resection site.  The area of resection was then fulgurated once again with a resectoscope loop.  Hemostasis was excellent.  All the tumor had been removed.  Ureteral orifices were uninjured.  No evidence of perforation.  It was not felt that a catheter was  warranted.  The bladder was emptied per cystoscope.  The procedure was then terminated.  The patient tolerated the procedure well.  No immediate periprocedural complications.  The patient  was taken to postanesthesia care unit in stable condition and plan  for discharge home.   MUK D: 04/18/2024 12:11:57 pm T: 04/18/2024 10:30:00 pm  JOB:  [id number]/ [id number]

## 2024-04-18 NOTE — Brief Op Note (Signed)
 04/18/2024  12:07 PM  PATIENT:  Diana Kennedy  68 y.o. female  PRE-OPERATIVE DIAGNOSIS:  RECURRENT BLADDER CANCER  POST-OPERATIVE DIAGNOSIS:  * No post-op diagnosis entered *  PROCEDURE:  Procedure(s): TURBT (TRANSURETHRAL RESECTION OF BLADDER TUMOR) (Bilateral) CYSTOSCOPY, WITH RETROGRADE PYELOGRAM (Bilateral)  SURGEON:  Surgeons and Role:    * Manny, Ricardo KATHEE Raddle., MD - Primary  PHYSICIAN ASSISTANT:   ASSISTANTS: none   ANESTHESIA:   general  EBL:  minimal   BLOOD ADMINISTERED:none  DRAINS: none   LOCAL MEDICATIONS USED:  NONE  SPECIMEN:  Source of Specimen:  1 -bladder tumor; 2 - base of bladder tumor  DISPOSITION OF SPECIMEN:  PATHOLOGY  COUNTS:  YES  TOURNIQUET:  * No tourniquets in log *  DICTATION: .Other Dictation: Dictation Number [id number]  PLAN OF CARE: Discharge to home after PACU  PATIENT DISPOSITION:  PACU - hemodynamically stable.   Delay start of Pharmacological VTE agent (>24hrs) due to surgical blood loss or risk of bleeding: yes

## 2024-04-18 NOTE — Anesthesia Preprocedure Evaluation (Addendum)
 Anesthesia Evaluation  Patient identified by MRN, date of birth, ID band Patient awake    Reviewed: Allergy & Precautions, H&P , NPO status , Patient's Chart, lab work & pertinent test results  Airway Mallampati: II  TM Distance: >3 FB Neck ROM: Full    Dental no notable dental hx. (+) Edentulous Upper, Edentulous Lower,    Pulmonary Patient abstained from smoking., former smoker   Pulmonary exam normal breath sounds clear to auscultation       Cardiovascular hypertension, + angina  + CAD, + Past MI, + CABG and + Peripheral Vascular Disease   Rhythm:Regular Rate:Normal     Neuro/Psych  PSYCHIATRIC DISORDERS Anxiety Depression     Neuromuscular disease negative neurological ROS     GI/Hepatic Neg liver ROS,GERD  Medicated,,  Endo/Other  diabetesHypothyroidism    Renal/GU Renal disease  negative genitourinary   Musculoskeletal  (+)  Fibromyalgia -  Abdominal   Peds  Hematology  (+) Blood dyscrasia, anemia   Anesthesia Other Findings   Reproductive/Obstetrics negative OB ROS                              Anesthesia Physical Anesthesia Plan  ASA: 4  Anesthesia Plan: General   Post-op Pain Management: Ofirmev  IV (intra-op)*   Induction: Intravenous  PONV Risk Score and Plan: 4 or greater and Ondansetron  and Dexamethasone   Airway Management Planned: Oral ETT and LMA  Additional Equipment: None  Intra-op Plan:   Post-operative Plan: Extubation in OR  Informed Consent: I have reviewed the patients History and Physical, chart, labs and discussed the procedure including the risks, benefits and alternatives for the proposed anesthesia with the patient or authorized representative who has indicated his/her understanding and acceptance.     Dental advisory given  Plan Discussed with: CRNA and Anesthesiologist  Anesthesia Plan Comments: (DISCUSSION: Diana Kennedy is a 68 yo female who  presents to PAT prior to surgery above. PMH of former smoking, hx of multiple MIs, CAD s/p CABG x 4 (2019), PVD (carotid artery disease, R subclavian artery stenosis s/p bypass (2015), HTN, COPD, neuropathy, GERD, hypothyroid, fibromyalgia, anxiety, depression, chronic anemia, hx of endometrial cancer s/p hysterectomy and chemo/XRT, bladder cancer, chronic pain syndrome with narcotic dependence, s/p lumbar fusion L4-L5   Had TURBT in 12/2022, 02/2023, 07/2023.    Patient has extensive cardiac hx. Has not been seen by Cardiology since 2019 per chart review. She has a hx of multiple MIs since 2004 with 5-stents placed in 2009 by Dr. Maralee at [facility]. Then had CABG in 04/2018 at [facility]. Lost to f/u after that.   Patient established care with PCP at [facility]. Stress test ordered to check things out per patient. Office notes requested. She denies CP/SOB and reports going up a flight of stairs without difficulty.   Patient also has hx of IDDM which is uncontrolled. Glucose 310 at PAT visit.   Hx of vascular disease. She has mild-mod bilateral carotid disease and right subclavian artery stenosis. She underwent right carotid to subclavian artery bypass in 04/2014. Lost to follow up.   Discussed with Dr. Peggye. Since patient is asymptomatic and has adequate functional status, ok to proceed and eval DOS.  TEE 04/28/2018 ([facility]):   Left ventricular systolic function is normal. Ejection Fraction = >55%. The transmitral spectral Doppler flow pattern is suggestive of pseudonormalization. The right ventricle is normal in size and function. The interatrial septum is intact with  no evidence for an atrial septal defect. Chiari network (normal variant) is noted. The mitral valve is normal in structure and function. The tricuspid valve is normal in structure and function. There is trace tricuspid regurgitation. The aortic valve is normal in structure and function. Calcification of non-coronary  cusp with normal valve opening. > The aortic root is normal size. Moderate atherosclerotic plaque(s) in the descending aorta. Severe atherosclerotic plaque(s) in the aortic arch. Small  pericardial effusion. There is no pleural effusion.   LHC 04/22/2018 ([facility]):     Multi-vessel coronary artery disease.  Refer to cardiothoracic surgeon for opinion and possible surgery.   CP via right radial for positive stress test.  Prox LAD with obstructive ISR, borderline mid LAD, high grade D2.  Prox Cx with high grade disease.   Mid RCA CTO with L-R and R-R collaterals.  EBL < 5 cc, prelude sync for hemostasis, no specimens.   Coronary Findings Diagnostic Dominance: Right Left Main: LM lesion, 30% stenosed. Left Anterior Descending: Mid LAD lesion, 70% stenosed. The lesion was previously treated using a stent (unknown type). Previous stent with restenosis. Second Diagonal Branch: Ost 2nd Diag to 2nd Diag lesion, 95% stenosed. Left Circumflex: Mid Cx lesion, 80% stenosed. Right Coronary Artery: Prox RCA lesion, 70% stenosed. Mid RCA lesion, 100% stenosed. Acute Marginal Branch: Acute Mrg filled by collaterals from Acute Mrg. Right Posterior Descending Artery: RPDA filled by collaterals from Dist LAD. Third Right Posterolateral Branch: 3rd RPL filled by collaterals from Dist Cx.      )        Anesthesia Quick Evaluation

## 2024-04-18 NOTE — Anesthesia Postprocedure Evaluation (Signed)
 Anesthesia Post Note  Patient: Diana Kennedy  Procedure(s) Performed: TURBT (TRANSURETHRAL RESECTION OF BLADDER TUMOR) (Bilateral) CYSTOSCOPY, WITH RETROGRADE PYELOGRAM (Bilateral)     Patient location during evaluation: PACU Anesthesia Type: General Level of consciousness: awake and alert Pain management: pain level controlled Vital Signs Assessment: post-procedure vital signs reviewed and stable Respiratory status: spontaneous breathing, nonlabored ventilation, respiratory function stable and patient connected to nasal cannula oxygen Cardiovascular status: blood pressure returned to baseline and stable Postop Assessment: no apparent nausea or vomiting Anesthetic complications: no   No notable events documented.  Last Vitals:  Vitals:   04/18/24 0859 04/18/24 1253  BP: 120/70 103/64  Pulse: 96 72  Resp: 16 12  Temp: 36.4 C (!) 35.8 C  SpO2: 98% 100%    Last Pain:  Vitals:   04/18/24 1253  TempSrc:   PainSc: 0-No pain                 Faizan Geraci

## 2024-04-18 NOTE — Transfer of Care (Signed)
 Immediate Anesthesia Transfer of Care Note  Patient: Diana Kennedy  Procedure(s) Performed: TURBT (TRANSURETHRAL RESECTION OF BLADDER TUMOR) (Bilateral) CYSTOSCOPY, WITH RETROGRADE PYELOGRAM (Bilateral)  Patient Location: PACU  Anesthesia Type:General  Level of Consciousness: awake, alert , and oriented  Airway & Oxygen Therapy: Patient Spontanous Breathing and Patient connected to face mask oxygen  Post-op Assessment: Report given to RN and Post -op Vital signs reviewed and stable  Post vital signs: Reviewed and stable  Last Vitals:  Vitals Value Taken Time  BP    Temp    Pulse 70 04/18/24 12:54  Resp 15 04/18/24 12:54  SpO2 100 % 04/18/24 12:54  Vitals shown include unfiled device data.  Last Pain:  Vitals:   04/18/24 1027  TempSrc:   PainSc: 6       Patients Stated Pain Goal: 4 (04/18/24 1000)  Complications: No notable events documented.

## 2024-04-19 ENCOUNTER — Encounter (HOSPITAL_COMMUNITY): Payer: Self-pay | Admitting: Urology

## 2024-04-19 LAB — SURGICAL PATHOLOGY
